# Patient Record
Sex: Male | Born: 1950 | Race: White | Hispanic: No | Marital: Married | Smoking: Never smoker
Health system: Southern US, Community
[De-identification: ages and names within clinical notes are randomized; demographics above are authoritative.]

## PROBLEM LIST (undated history)

## (undated) DIAGNOSIS — E785 Hyperlipidemia, unspecified: Secondary | ICD-10-CM

## (undated) DIAGNOSIS — I10 Essential (primary) hypertension: Secondary | ICD-10-CM

## (undated) DIAGNOSIS — R413 Other amnesia: Secondary | ICD-10-CM

## (undated) DIAGNOSIS — I350 Nonrheumatic aortic (valve) stenosis: Secondary | ICD-10-CM

## (undated) DIAGNOSIS — G4733 Obstructive sleep apnea (adult) (pediatric): Secondary | ICD-10-CM

## (undated) DIAGNOSIS — Z8616 Personal history of COVID-19: Secondary | ICD-10-CM

## (undated) DIAGNOSIS — E119 Type 2 diabetes mellitus without complications: Secondary | ICD-10-CM

## (undated) DIAGNOSIS — I482 Chronic atrial fibrillation, unspecified: Secondary | ICD-10-CM

## (undated) DIAGNOSIS — Z9989 Dependence on other enabling machines and devices: Secondary | ICD-10-CM

## (undated) HISTORY — DX: Nonrheumatic aortic (valve) stenosis: I35.0

## (undated) HISTORY — PX: OTHER SURGICAL HISTORY: SHX169

## (undated) HISTORY — DX: Other amnesia: R41.3

## (undated) HISTORY — DX: Hyperlipidemia, unspecified: E78.5

## (undated) HISTORY — DX: Type 2 diabetes mellitus without complications: E11.9

## (undated) HISTORY — PX: UMBILICAL HERNIA REPAIR: SHX196

## (undated) HISTORY — DX: Personal history of COVID-19: Z86.16

## (undated) HISTORY — DX: Essential (primary) hypertension: I10

## (undated) HISTORY — DX: Dependence on other enabling machines and devices: Z99.89

## (undated) HISTORY — DX: Obstructive sleep apnea (adult) (pediatric): G47.33

## (undated) HISTORY — DX: Chronic atrial fibrillation, unspecified: I48.20

## (undated) HISTORY — PX: CATARACT EXTRACTION: SUR2

---
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
## 1997-12-03 ENCOUNTER — Emergency Department (HOSPITAL_COMMUNITY): Admission: EM | Admit: 1997-12-03 | Discharge: 1997-12-03 | Payer: Self-pay | Admitting: Emergency Medicine

## 2003-11-16 HISTORY — PX: CARDIAC CATHETERIZATION: SHX172

## 2003-11-29 ENCOUNTER — Inpatient Hospital Stay (HOSPITAL_COMMUNITY): Admission: EM | Admit: 2003-11-29 | Discharge: 2003-12-01 | Payer: Self-pay | Admitting: Emergency Medicine

## 2005-07-17 ENCOUNTER — Emergency Department (HOSPITAL_COMMUNITY): Admission: EM | Admit: 2005-07-17 | Discharge: 2005-07-17 | Payer: Self-pay | Admitting: Emergency Medicine

## 2007-10-29 HISTORY — PX: NM MYOCAR PERF WALL MOTION: HXRAD629

## 2007-11-03 ENCOUNTER — Ambulatory Visit (HOSPITAL_COMMUNITY): Admission: RE | Admit: 2007-11-03 | Discharge: 2007-11-03 | Payer: Self-pay | Admitting: Family Medicine

## 2008-04-20 ENCOUNTER — Ambulatory Visit: Payer: Self-pay | Admitting: Internal Medicine

## 2008-04-20 ENCOUNTER — Encounter: Payer: Self-pay | Admitting: Internal Medicine

## 2008-04-20 DIAGNOSIS — G4733 Obstructive sleep apnea (adult) (pediatric): Secondary | ICD-10-CM | POA: Insufficient documentation

## 2008-04-20 DIAGNOSIS — I10 Essential (primary) hypertension: Secondary | ICD-10-CM | POA: Insufficient documentation

## 2008-04-20 DIAGNOSIS — I482 Chronic atrial fibrillation, unspecified: Secondary | ICD-10-CM

## 2008-04-20 HISTORY — DX: Essential (primary) hypertension: I10

## 2008-04-20 HISTORY — DX: Obstructive sleep apnea (adult) (pediatric): G47.33

## 2008-04-20 HISTORY — DX: Chronic atrial fibrillation, unspecified: I48.20

## 2008-04-23 ENCOUNTER — Ambulatory Visit: Payer: Self-pay

## 2008-05-13 ENCOUNTER — Encounter: Payer: Self-pay | Admitting: Internal Medicine

## 2008-05-25 ENCOUNTER — Ambulatory Visit (HOSPITAL_COMMUNITY): Admission: RE | Admit: 2008-05-25 | Discharge: 2008-05-25 | Payer: Self-pay | Admitting: Cardiovascular Disease

## 2008-05-25 HISTORY — PX: CARDIOVERSION: SHX1299

## 2008-06-18 ENCOUNTER — Ambulatory Visit: Payer: Self-pay | Admitting: Internal Medicine

## 2008-06-24 ENCOUNTER — Telehealth: Payer: Self-pay | Admitting: Internal Medicine

## 2008-08-16 ENCOUNTER — Encounter: Payer: Self-pay | Admitting: Internal Medicine

## 2008-11-19 HISTORY — PX: US ECHOCARDIOGRAPHY: HXRAD669

## 2008-12-15 ENCOUNTER — Emergency Department (HOSPITAL_COMMUNITY): Admission: EM | Admit: 2008-12-15 | Discharge: 2008-12-15 | Payer: Self-pay | Admitting: Emergency Medicine

## 2009-01-26 ENCOUNTER — Encounter (INDEPENDENT_AMBULATORY_CARE_PROVIDER_SITE_OTHER): Payer: Self-pay | Admitting: General Surgery

## 2009-01-26 ENCOUNTER — Observation Stay (HOSPITAL_COMMUNITY): Admission: RE | Admit: 2009-01-26 | Discharge: 2009-01-27 | Payer: Self-pay | Admitting: General Surgery

## 2010-04-02 LAB — APTT
aPTT: 25 seconds (ref 24–37)
aPTT: 29 seconds (ref 24–37)

## 2010-04-02 LAB — PROTIME-INR
INR: 1 (ref 0.00–1.49)
INR: 1.25 (ref 0.00–1.49)
Prothrombin Time: 13.1 seconds (ref 11.6–15.2)
Prothrombin Time: 15.6 seconds — ABNORMAL HIGH (ref 11.6–15.2)

## 2010-04-02 LAB — DIFFERENTIAL
Basophils Absolute: 0.1 10*3/uL (ref 0.0–0.1)
Basophils Relative: 1 % (ref 0–1)
Eosinophils Absolute: 0.1 10*3/uL (ref 0.0–0.7)
Eosinophils Relative: 1 % (ref 0–5)
Lymphocytes Relative: 21 % (ref 12–46)
Lymphs Abs: 1.4 10*3/uL (ref 0.7–4.0)
Monocytes Absolute: 0.7 10*3/uL (ref 0.1–1.0)
Monocytes Relative: 10 % (ref 3–12)
Neutro Abs: 4.6 10*3/uL (ref 1.7–7.7)
Neutrophils Relative %: 67 % (ref 43–77)

## 2010-04-02 LAB — CBC
HCT: 46.4 % (ref 39.0–52.0)
Hemoglobin: 15.4 g/dL (ref 13.0–17.0)
MCHC: 33.1 g/dL (ref 30.0–36.0)
MCV: 87.6 fL (ref 78.0–100.0)
Platelets: 238 10*3/uL (ref 150–400)
RBC: 5.3 MIL/uL (ref 4.22–5.81)
RDW: 13.1 % (ref 11.5–15.5)
WBC: 6.8 10*3/uL (ref 4.0–10.5)

## 2010-04-02 LAB — BASIC METABOLIC PANEL
BUN: 14 mg/dL (ref 6–23)
CO2: 28 mEq/L (ref 19–32)
Calcium: 9.5 mg/dL (ref 8.4–10.5)
Chloride: 104 mEq/L (ref 96–112)
Creatinine, Ser: 0.88 mg/dL (ref 0.4–1.5)
GFR calc Af Amer: >60 mL/min (ref >60)
GFR calc non Af Amer: >60 mL/min (ref >60)
Glucose, Bld: 135 mg/dL — ABNORMAL HIGH (ref 70–99)
Potassium: 3.7 mEq/L (ref 3.5–5.1)
Sodium: 139 mEq/L (ref 135–145)

## 2010-04-02 LAB — GLUCOSE, CAPILLARY
Glucose-Capillary: 138 mg/dL — ABNORMAL HIGH (ref 70–99)
Glucose-Capillary: 143 mg/dL — ABNORMAL HIGH (ref 70–99)
Glucose-Capillary: 165 mg/dL — ABNORMAL HIGH (ref 70–99)
Glucose-Capillary: 170 mg/dL — ABNORMAL HIGH (ref 70–99)
Glucose-Capillary: 184 mg/dL — ABNORMAL HIGH (ref 70–99)

## 2010-04-25 LAB — PROTIME-INR
INR: 2.9 — ABNORMAL HIGH (ref 0.00–1.49)
Prothrombin Time: 32.5 seconds — ABNORMAL HIGH (ref 11.6–15.2)

## 2010-04-25 LAB — GLUCOSE, CAPILLARY: Glucose-Capillary: 92 mg/dL (ref 70–99)

## 2010-05-30 NOTE — Op Note (Signed)
NAMEBIRNEY, BELSHE NO.:  [id number]   MEDICAL RECORD NO.:  [id number]          PATIENT TYPE:  OIB   LOCATION:  2899                         FACILITY:  [facility]   PHYSICIAN:  Nanetta Batty, M.D.   DATE OF BIRTH:  05-30-1950   DATE OF PROCEDURE:  DATE OF DISCHARGE:  05/25/2008                               OPERATIVE REPORT   Mr. Nemes is a 60 year old mildly overweight Caucasian male with  history of paroxysmal AFib since 2005.  His last recorded sinus rhythm  was in October 2009.  He has normal coronary arteries by cath, November 30, 2003, and normal LV function.  He has been on Coumadin  anticoagulation.  His other problems include hypertension,  hypothyroidism, non-insulin-requiring diabetes, and hyperlipidemia.  He  does have sleep apnea, currently on CPAP.  Myoview performed in October  2009 was nonischemic.  We have been titrating his negative chronotropes  and he has seen Dr. Hillis Range for electrophysiologic evaluation and  put on MULTAQ.  He presents now for attempt at elective outpatient DC  cardioversion.   The patient was brought to the second floor of [facility] outpatient  area in the postabsorptive state.  His labs were normal.  His INR was  therapeutic.  AP pads were placed.  Dr. Randa Evens, the anesthesiologist,  administered 100 mg of IV propofol and 40 mg of lidocaine.  He had 1  shock and 120 joules biphasic restoring him to sinus rhythm.  He  tolerated the procedure well.  He will be discharged home in 2 hours,  and a 1 week office visit has been made.       Nanetta Batty, M.D.  Electronically Signed     JB/MEDQ  D:  05/25/2008  T:  05/26/2008  Job:  [id number]   cc:   [id number] [facility].  Hillis Range, MD  Leane Call, MD

## 2010-06-02 NOTE — Cardiovascular Report (Signed)
NAMEZEKIAH, CARUTH                 ACCOUNT NO.:  [id number]   MEDICAL RECORD NO.:  [id number]          PATIENT TYPE:  INP   LOCATION:  6531                         FACILITY:  [facility]   PHYSICIAN:  Thereasa Solo. Little, M.D. DATE OF BIRTH:  1950-05-20   DATE OF PROCEDURE:  11/30/2003  DATE OF DISCHARGE:                              CARDIAC CATHETERIZATION   INDICATIONS FOR PROCEDURE:  This 60 year old male presented with chest  discomfort and rapid atrial fibrillation.  His cardiac enzymes are  unremarkable.  His atrial fibrillation converted with medical therapy.  He  is brought to the catheterization lab for evaluation of his coronary  anatomy.   After obtaining informed consent, the patient was prepped and draped in the  usual sterile fashion, exposing the right groin.  Applying local anesthetic  with 1% Xylocaine, the Seldinger technique was employed and a 5-French  introducer sheath was placed into the right femoral artery.  Left and right  coronary arteriography, ventriculography and a distal aortogram was  performed.   COMPLICATIONS:  None.   EQUIPMENT:  Judkins 5-French configuration catheters.   TOTAL CONTRAST:  90 cc.   RESULTS:   HEMODYNAMIC MONITORING:  1.  Central aortic pressure:  114/70.  2.  Left ventricular pressure:  114/9 with no aortic valve gradient noted at      the time of pullback.   VENTRICULOGRAPHY:  Done in the RAO projection, revealed normal left  ventricular systolic  function. Ejection fraction was greater than 60%.  No  mitral regurgitation.  Left ventricular end diastolic pressure was 17.   DISTAL AORTOGRAM:  There was no evidence of renal artery stenosis, abdominal  aortic aneurysm  or iliac disease.   CORONARY ARTERIOGRAPHY:  1.  LEFT MAIN:  Normal.  2.  LEFT ANTERIOR DESCENDING ARTERY:  This extended down towards the apex of      the heart. There was a moderate sized first diagonal that was free of      disease.  The LAD itself had only  minimal irregularities.  3.  CIRCUMFLEX CORONARY ARTERY:  A very large vessel, greater than 4 mm in      diameter.  It gave rise to a medium sized first OM and a very large      second OM -- all of which were free of significant disease.  4.  RIGHT CORONARY ARTERY:  Greater than a 4 mm vessel, with less than 20%      narrowing in the proximal segment.  The PDA and posterolateral vessels      were free of disease.   CONCLUSION:  1.  Normal left ventricular systolic  function.  2.  No renal artery stenosis.  3.  No coronary artery disease, except for minimal irregularities.       ABL/MEDQ  D:  11/30/2003  T:  11/30/2003  Job:  [id number]   cc:   Janeece Riggers. Severiano Gilbert, M.D.  Fax: [phone]   Leane Call, M.D.  [place], [place]

## 2010-06-02 NOTE — Discharge Summary (Signed)
William Preston, William Preston                 ACCOUNT NO.:  [id number]   MEDICAL RECORD NO.:  [id number]          PATIENT TYPE:  INP   LOCATION:  6531                         FACILITY:  [facility]   PHYSICIAN:  Thereasa Solo. Little, M.D. DATE OF BIRTH:  January 07, 1951   DATE OF ADMISSION:  11/29/2003  DATE OF DISCHARGE:  12/01/2003                                 DISCHARGE SUMMARY   ADMISSION DIAGNOSES:  1.  New onset of atrial fibrillation with one prior episode reported.      History of negative stress test.  2.  Hypertension x10 years.  3.  Adult-onset diabetes mellitus, non-insulin-dependent, 2-3 years with      questionable control.  4.  Hyperlipidemia.  5.  Probable diabetic neuropathy.  6.  Obesity with body mass index of 35.9.   DISCHARGE DIAGNOSES:  1.  New onset of atrial fibrillation with one prior episode reported.      History of negative stress test.  2.  Hypertension x10 years.  3.  Adult-onset diabetes mellitus, non-insulin-dependent, 2-3 years with      questionable control.  4.  Hyperlipidemia.  5.  Probable diabetic neuropathy.  6.  Obesity with body mass index of 35.9.   PROCEDURE:  Cardiac catheterization on December 01, 2003.   BRIEF HISTORY:  The patient is a 60 year old white male who lives in  [place], [place], and commutes to [place].  He went to work today  feeling bad.  He had a headache and was tired easily when climbing stairs.  He became diaphoretic.  He decreased his activity.  He finally went to see  the nurse at work around noon today.  His blood pressure was in the 90s, and  his heart rate was between 150-160.  EMTs were called.  An EKG showed atrial  fibrillation with a rate of 170 on their arrival.  He was treated with IV  Cardizem and converted en route to the ER.  Patient has a history of skips  he can feel but could not tell when his heart rate was up or when his heart  rate came back down.  He denied any chest pain.  He did not have any  perceived  shortness of breath with this but in retrospect says he was  probably short of breath, which was the reason his activity was so much more  difficult.  He had no nausea or vomiting.  Some mild diaphoresis.  He was  seen in the ER, and his EKG showed a sinus rhythm with Q waves in the septal  leads but probably positional.  T was flipped in aVL.  Point-of-care markers  were negative.  He was seen in the ER by Dr. Severiano Gilbert and admitted for  observation, rule out MI, and probable cardiac catheterization the following  day.   PAST MEDICAL HISTORY:  1.  Hypertension x10 years.  2.  AODM 2-3 years, he is not sure.  Some questionable diabetic neuropathy.  3.  Hyperlipidemia.  4.  Obesity with a BMI of 35.9.  5.  Status post splenectomy.  6.  History of  probable AF last year, single episode.  He underwent a stress      test at that time.  He has not been back since.  He has no prior 2-D      echo or cath and no documentation of the event that he had last year.   ALLERGIES:  None.   ETOH:  None.   CIGARETTES:  Never smoked.   DRUGS:  None.   REVIEW OF SYSTEMS:  He has had a headache all day.  PULMONARY:  No PND, no  orthopnea, no cough.  GI:  Negative.  GU:  Negative.  LOWER EXTREMITIES:  He  has some occasional numbness and swelling.   FAMILY HISTORY:  Both parents are deceased with coronary artery disease.   SOCIAL HISTORY:  He is married x35 years with three kids.  Works in  Restaurant manager, fast food.   HOME MEDICATIONS:  1.  Glucovance 25/500 daily.  2.  Pravachol 40 mg daily.  3.  Avapro, dose uncertain, with 150 daily.  4.  Hydrochlorothiazide 25 mg daily.  5.  Aspirin 81 mg daily.   PHYSICAL EXAMINATION:  VITAL SIGNS:  His heart rate was down to 89.  Blood  pressure was 140/73 in the ER.  Respiratory rate was 20.  Sats were greater  than 90%.  GENERAL:  Patient is an obese white male, well-developed and well-nourished,  in no acute distress.  HEENT:  Head is normocephalic.   Eyes:  PERRLA.  EOMs are intact.  Fundi not  visualized.  Ears, nose, mouth, and throat grossly within normal limits.  NECK:  No bruits.  No JVD.  No thyromegaly.  LUNGS:  Clear to auscultation and percussion.  CARDIAC:  No murmurs or rubs.  GI:  Soft.  Positive bowel sounds.  No bruits.  GU/RECTAL:  Not indicated.  EXTREMITIES:  Posterior DP and PT.  No edema.  NEUROLOGIC:  He has tingling in both feet, otherwise negative.   HOSPITAL COURSE:  He was seen and admitted to rule out MI.  His CKs and  troponins remained negative.  His hemoglobin A1c was 7.8.  TSH was 3.255.  Electrolytes were normal.  BUN was 20.  Creatinine was 0.9.  Glucose was  149.  Magnesium was 2.1.  He remained pain free.  He had good distal pulses.  He was seen the following a.m. and scheduled for a cardiac catheterization  by Dr. Clarene Duke on November 21, 2003.  This showed a normal left main.  The  circumflex was a large vessel, normal size.  The LAD went to the apex and  had a moderate-sized first diagonal.  All were normal.  The RCA was a 4 mm  vessel, less than 20% proximal RCA stenosis.  PDA and PL were normal.  The  LV showed normal systolic function with an EF of greater than 60%.  There  was no MR.   Patient was monitored overnight.  He remained in sinus rhythm.  Post cath,  his BUN is 14, creatinine 0.9.  Electrolytes were normal.  Glucose was down  to 126.  Hemoglobin is 15, hematocrit is 45, white count is 7.8, platelets  are 309,000.  He was seen the following a.m. by Dr. Clarene Duke.  He remained in  sinus rhythm, and it was Dr. Fredirick Maudlin opinion that the patient could be  discharged home on aspirin only.  This was discussed with the patient and  his wife in detail.  If he has one more episode of  atrial fibrillation, he  will need to go on Coumadin.   DISCHARGE MEDICATIONS:  1.  Toprol-XL 50 mg daily.  2.  Avapro 75 mg daily.  3.  Pravachol 80 mg daily. 4.  Glucovance 5/500, which he is to start on December 02, 2003.  5.  Aspirin 325 mg daily.  6.  Hydrochlorothiazide 1 mg daily.   He was scheduled to follow up with Dr. Severiano Gilbert, the physician who saw him in  the ER.  He is also to contact Dr. Clerance Lav, his primary care  physician in [place], [place], for followup.   CONDITION ON DISCHARGE:  Improved.       WDJ/MEDQ  D:  01/11/2004  T:  01/11/2004  Job:  [id number]   cc:   Janeece Riggers. Severiano Gilbert, M.D.  Fax: [phone]   Dr. Clerance Lav  [place], [place]

## 2010-06-07 IMAGING — CR DG CHEST 2V
2 series · 2 of 2 positions shown · non-contrast
Comparison: 11/29/2003.

CLINICAL DATA: Chest pain

CHEST - 2 VIEW

[w chest pa]
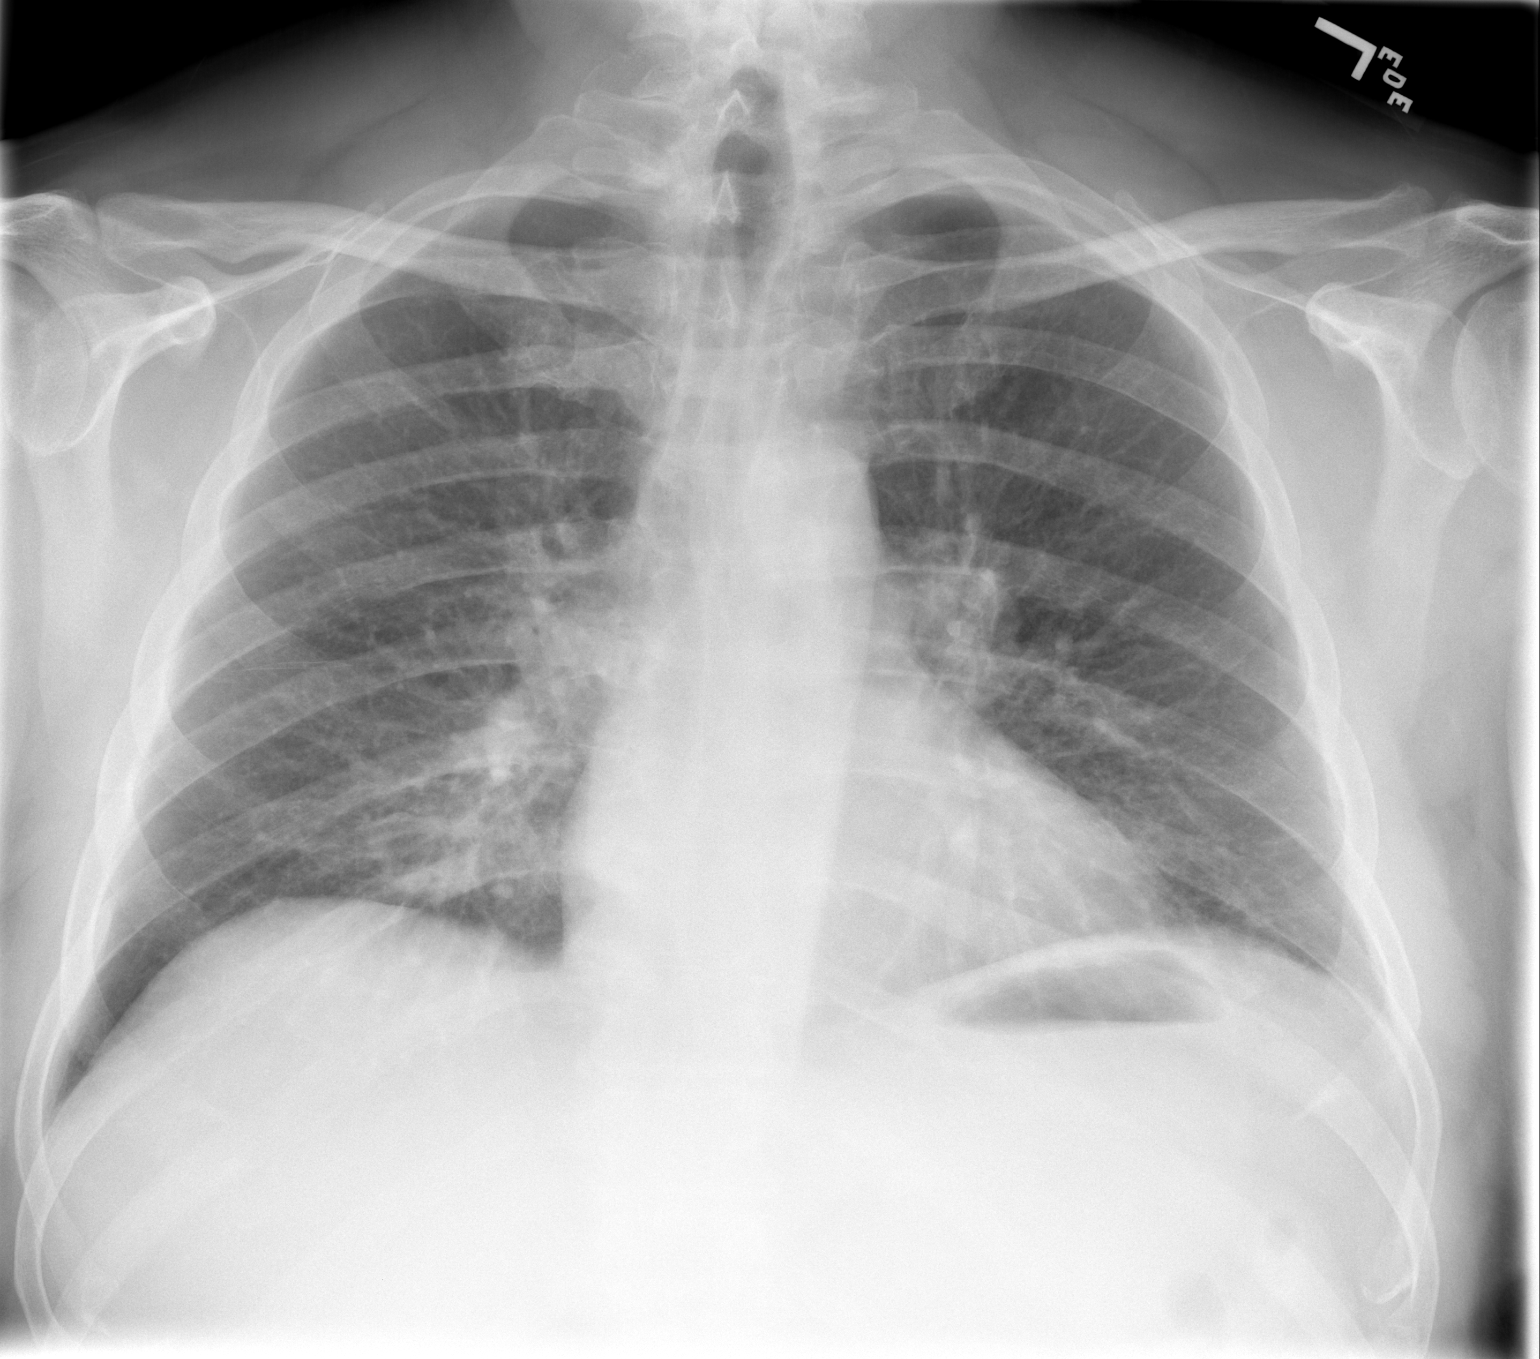

[w chest lat]
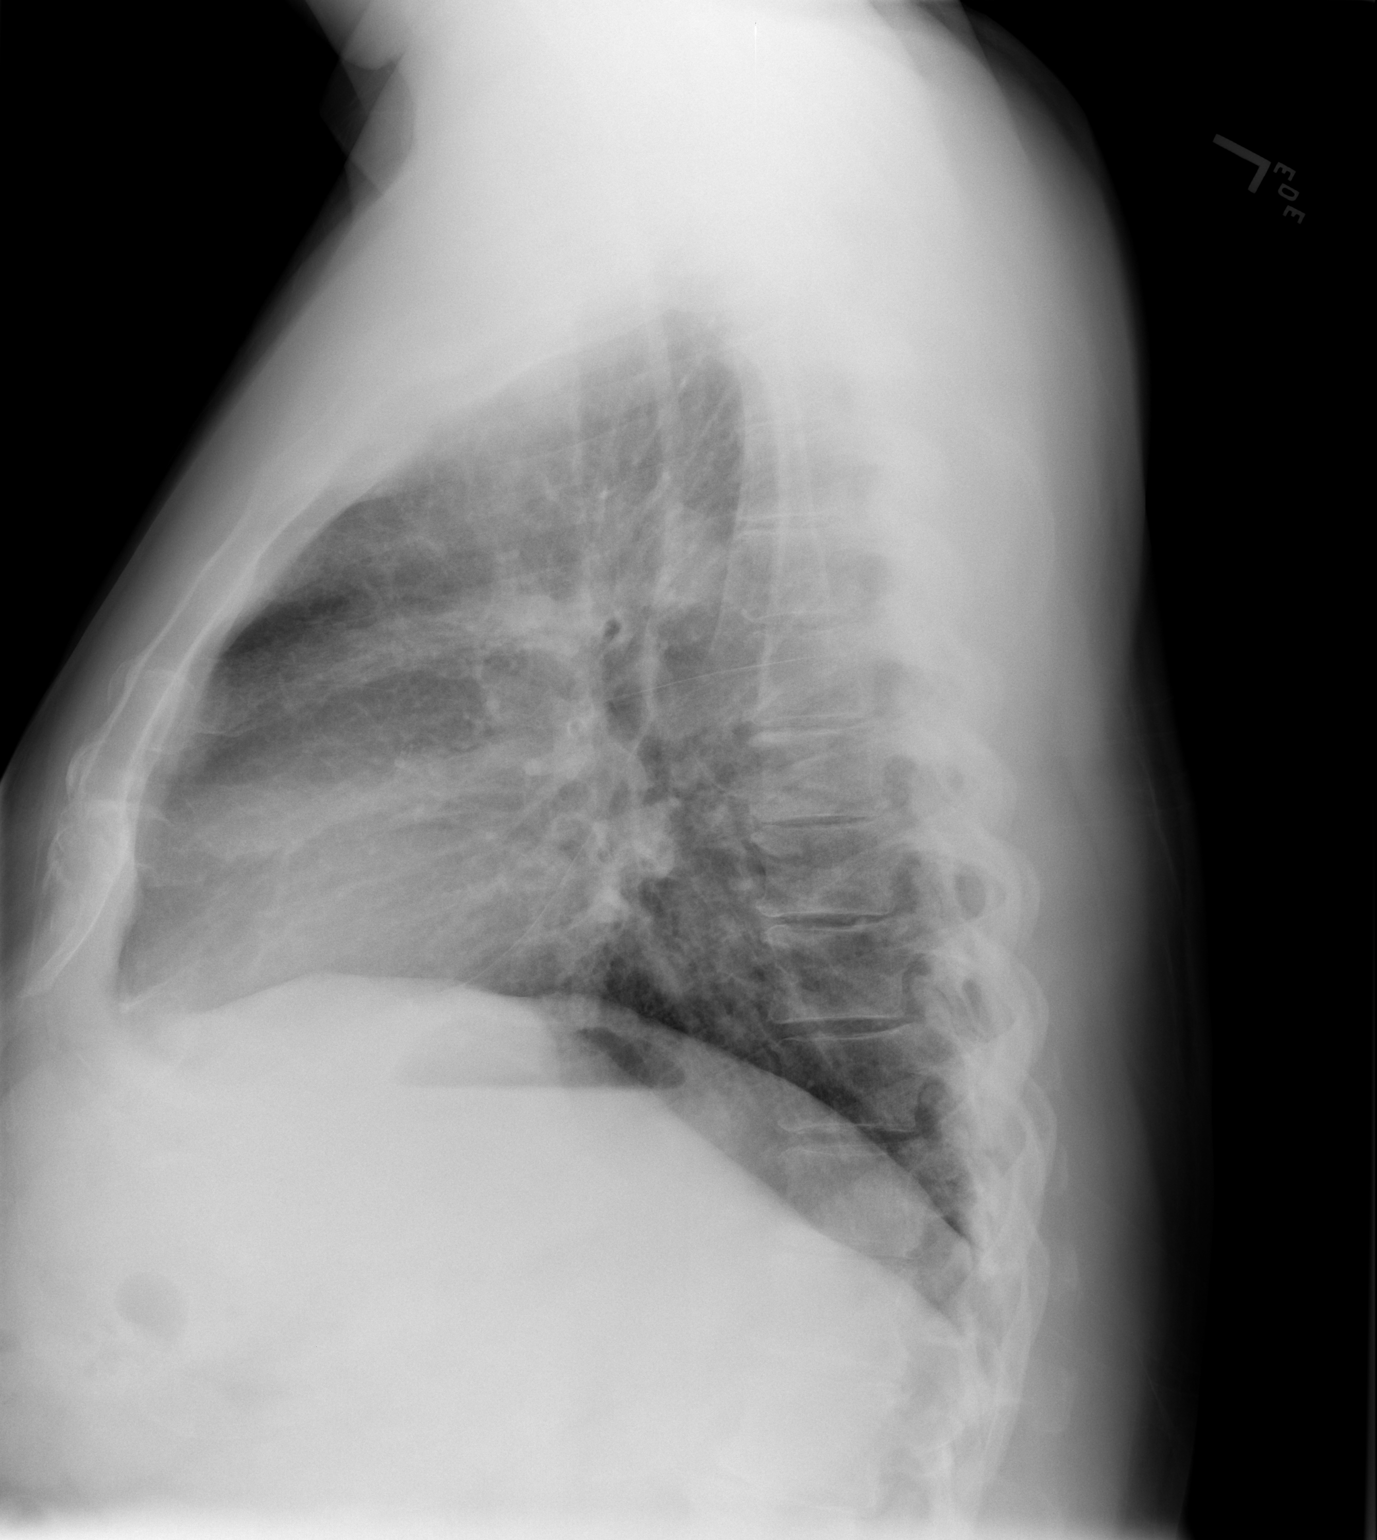

[2 of 2 positions shown; findings below may reference images not displayed]

FINDINGS: Diffuse peribronchial thickening.  Confluent opacities in
the right lower lung likely represent an infectious infiltrate.
The exam is negative for edema.  The heart is normal.  There is no
appreciable adenopathy.
IMPRESSION: 1.  Bronchitis with associated right lower lobe pneumonia.

## 2010-12-28 IMAGING — CR DG CHEST 2V
2 series · 2 of 2 positions shown · non-contrast
Comparison: 11/03/2007

CLINICAL DATA: Pre cardioversion

CHEST - 2 VIEW

[view not recorded (1 of 2)]
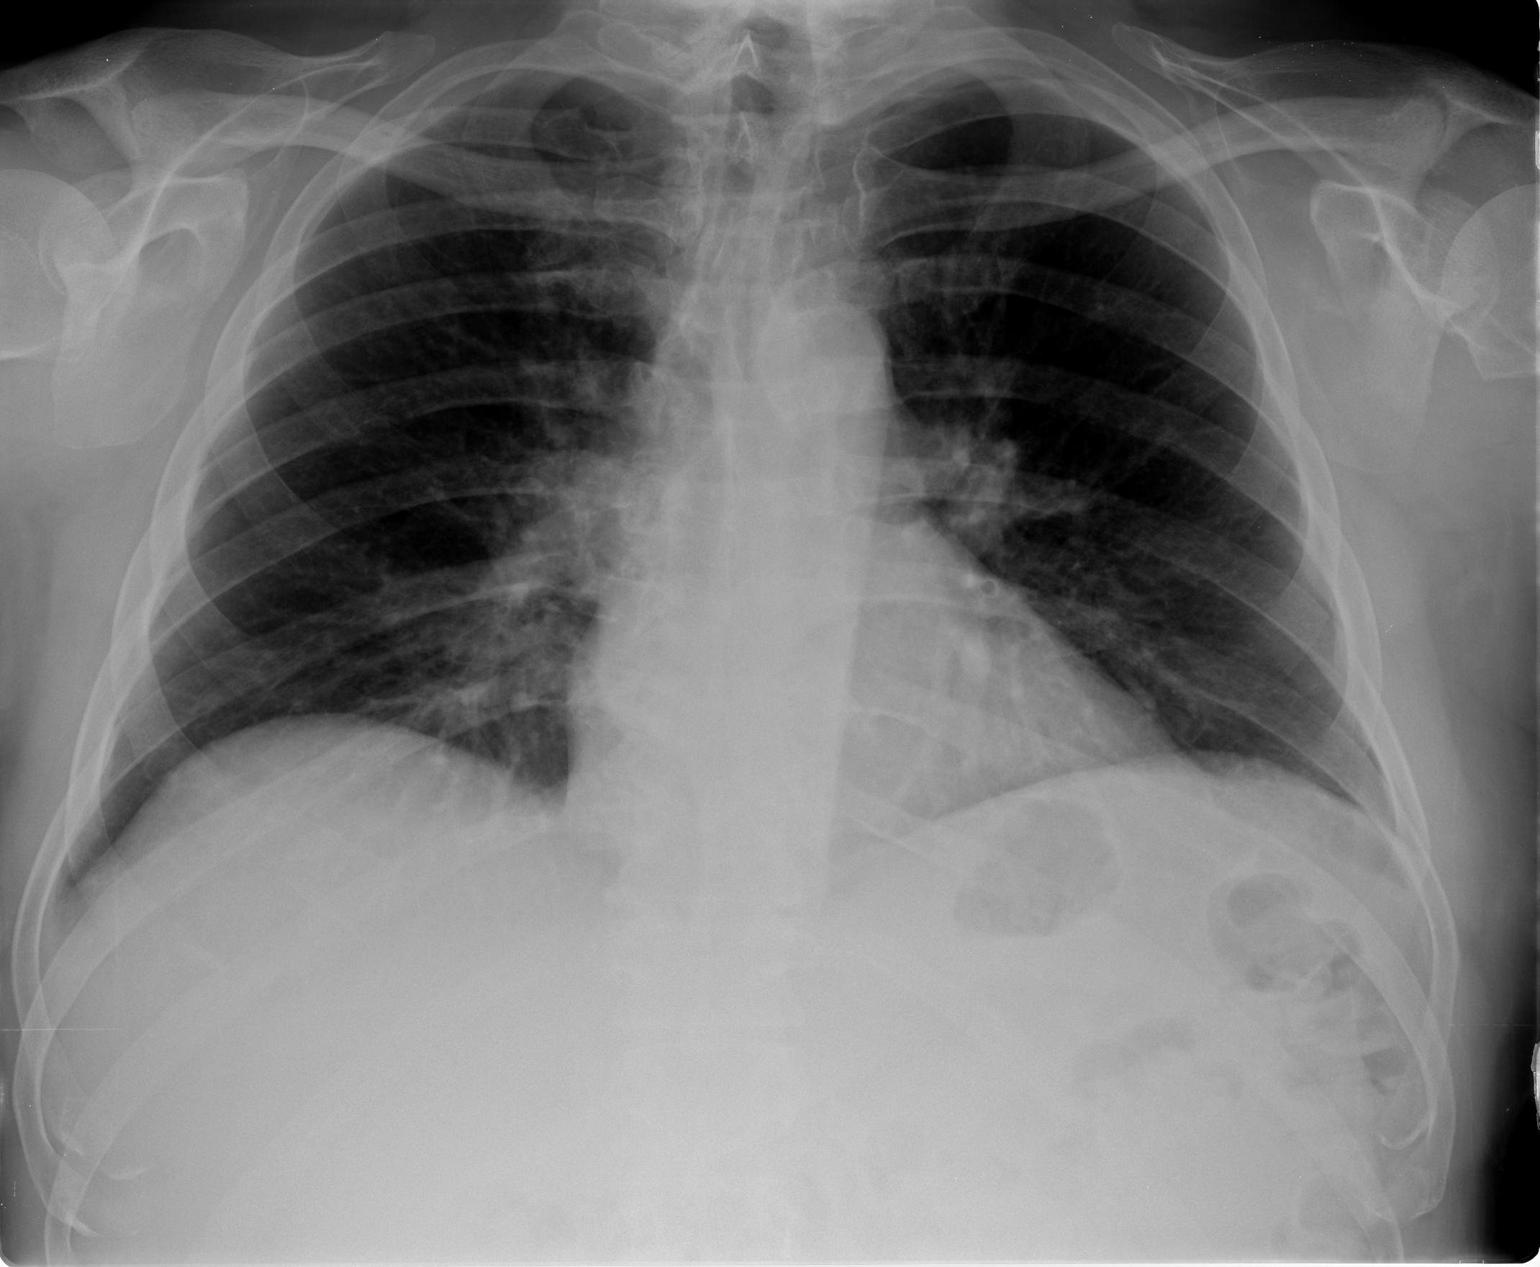

[view not recorded (2 of 2)]
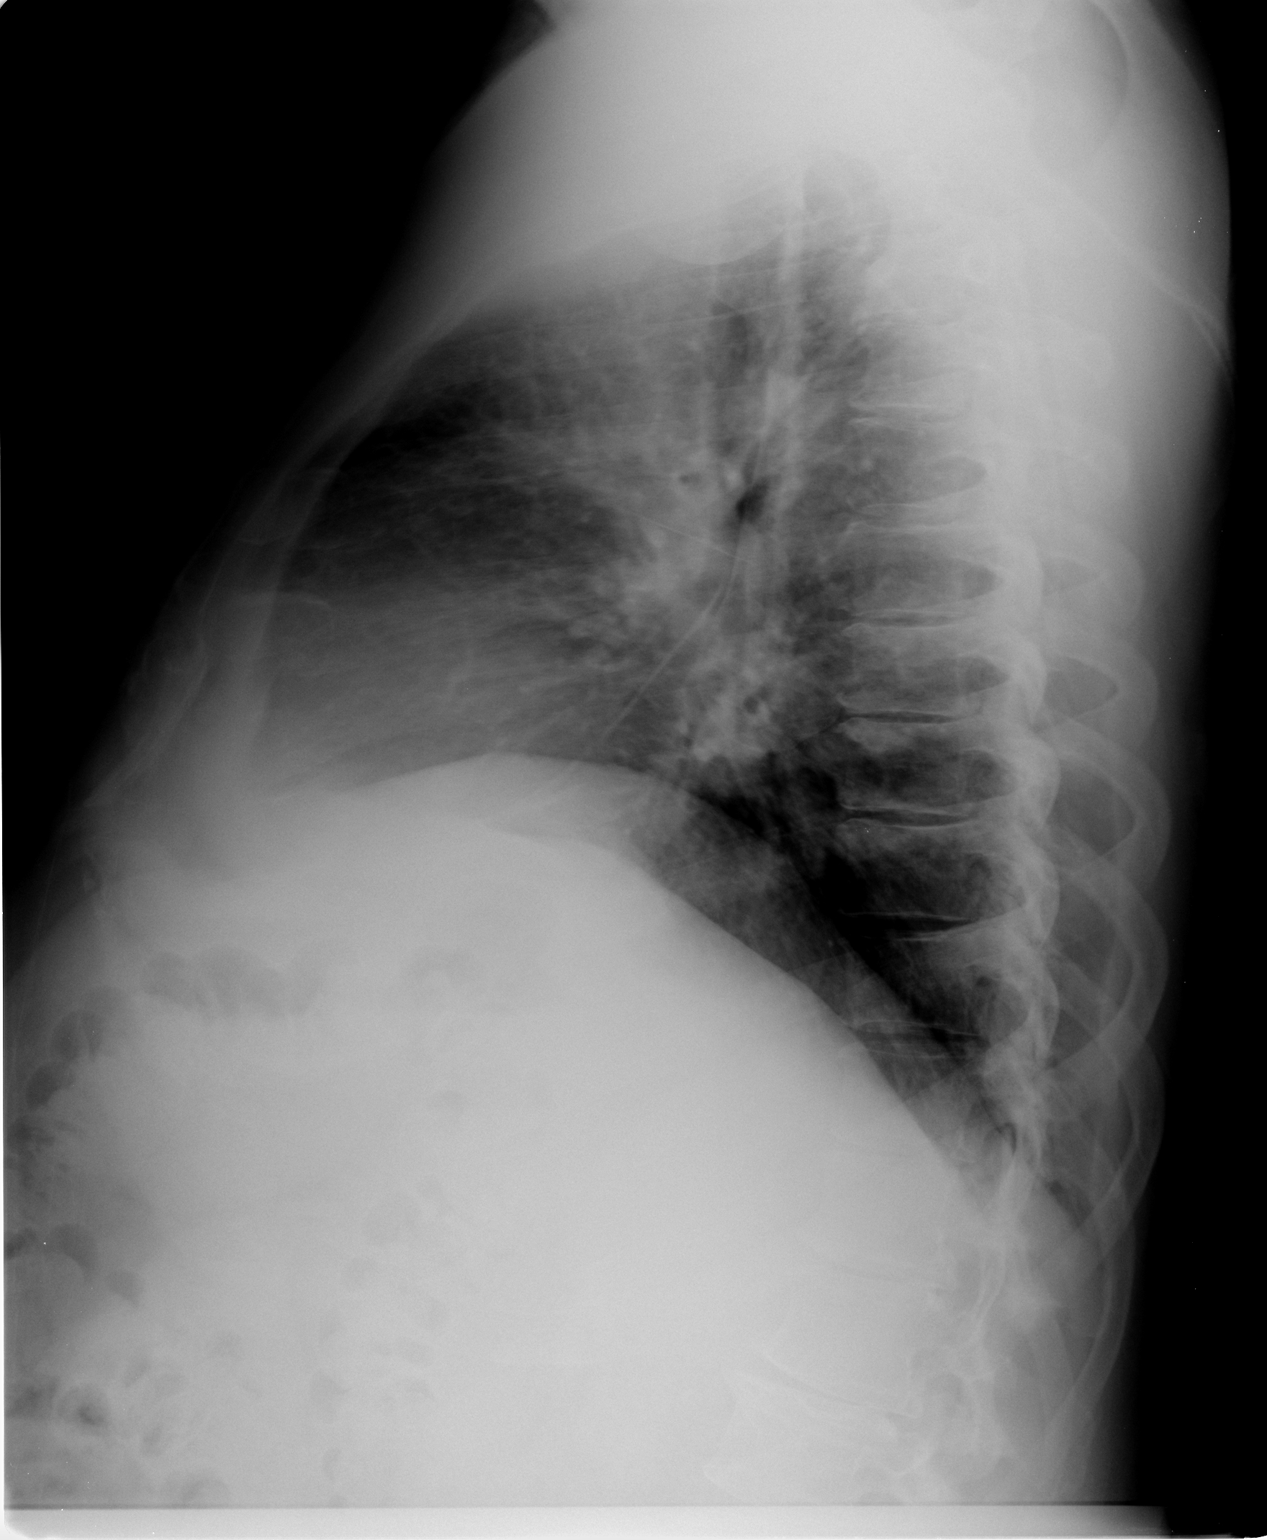

[2 of 2 positions shown; findings below may reference images not displayed]

FINDINGS: The heart size is normal.

There is no pleural effusion or pulmonary edema

The lung volumes are low.  No airspace densities.
IMPRESSION: 1.  No acute findings.

## 2012-04-01 ENCOUNTER — Ambulatory Visit: Payer: Self-pay | Admitting: Cardiovascular Disease

## 2012-04-01 DIAGNOSIS — I4891 Unspecified atrial fibrillation: Secondary | ICD-10-CM

## 2012-04-01 DIAGNOSIS — Z7901 Long term (current) use of anticoagulants: Secondary | ICD-10-CM

## 2012-04-01 HISTORY — DX: Long term (current) use of anticoagulants: Z79.01

## 2012-06-20 ENCOUNTER — Ambulatory Visit (INDEPENDENT_AMBULATORY_CARE_PROVIDER_SITE_OTHER): Payer: BC Managed Care – PPO | Admitting: Pharmacist Clinician (PhC)/ Clinical Pharmacy Specialist

## 2012-06-20 VITALS — BP 110/66 | HR 68

## 2012-06-20 DIAGNOSIS — I4891 Unspecified atrial fibrillation: Secondary | ICD-10-CM

## 2012-06-20 DIAGNOSIS — Z7901 Long term (current) use of anticoagulants: Secondary | ICD-10-CM

## 2012-06-20 LAB — POCT INR: INR: 2

## 2012-07-30 ENCOUNTER — Ambulatory Visit (INDEPENDENT_AMBULATORY_CARE_PROVIDER_SITE_OTHER): Payer: BC Managed Care – PPO | Admitting: Pharmacist Clinician (PhC)/ Clinical Pharmacy Specialist

## 2012-07-30 VITALS — BP 88/60 | HR 80

## 2012-07-30 DIAGNOSIS — Z7901 Long term (current) use of anticoagulants: Secondary | ICD-10-CM

## 2012-07-30 DIAGNOSIS — I4891 Unspecified atrial fibrillation: Secondary | ICD-10-CM

## 2012-07-30 LAB — POCT INR: INR: 1.9

## 2012-09-25 ENCOUNTER — Ambulatory Visit (INDEPENDENT_AMBULATORY_CARE_PROVIDER_SITE_OTHER): Payer: BC Managed Care – PPO | Admitting: Pharmacist Clinician (PhC)/ Clinical Pharmacy Specialist

## 2012-09-25 VITALS — BP 98/70 | HR 80

## 2012-09-25 DIAGNOSIS — I4891 Unspecified atrial fibrillation: Secondary | ICD-10-CM

## 2012-09-25 DIAGNOSIS — Z7901 Long term (current) use of anticoagulants: Secondary | ICD-10-CM

## 2012-09-25 LAB — POCT INR: INR: 2.3

## 2012-11-06 ENCOUNTER — Ambulatory Visit: Payer: BC Managed Care – PPO | Admitting: Cardiology

## 2012-11-06 ENCOUNTER — Encounter: Payer: Self-pay | Admitting: *Deleted

## 2012-11-06 ENCOUNTER — Ambulatory Visit (INDEPENDENT_AMBULATORY_CARE_PROVIDER_SITE_OTHER): Payer: BC Managed Care – PPO | Admitting: Cardiology

## 2012-11-06 ENCOUNTER — Encounter: Payer: Self-pay | Admitting: Cardiology

## 2012-11-06 ENCOUNTER — Ambulatory Visit (INDEPENDENT_AMBULATORY_CARE_PROVIDER_SITE_OTHER): Payer: BC Managed Care – PPO | Admitting: Pharmacist Clinician (PhC)/ Clinical Pharmacy Specialist

## 2012-11-06 VITALS — BP 120/80 | HR 96 | Ht 69.0 in | Wt 221.0 lb

## 2012-11-06 DIAGNOSIS — I4891 Unspecified atrial fibrillation: Secondary | ICD-10-CM

## 2012-11-06 DIAGNOSIS — R011 Cardiac murmur, unspecified: Secondary | ICD-10-CM | POA: Insufficient documentation

## 2012-11-06 DIAGNOSIS — IMO0001 Reserved for inherently not codable concepts without codable children: Secondary | ICD-10-CM | POA: Insufficient documentation

## 2012-11-06 DIAGNOSIS — E119 Type 2 diabetes mellitus without complications: Secondary | ICD-10-CM | POA: Insufficient documentation

## 2012-11-06 DIAGNOSIS — I10 Essential (primary) hypertension: Secondary | ICD-10-CM

## 2012-11-06 DIAGNOSIS — Z7901 Long term (current) use of anticoagulants: Secondary | ICD-10-CM

## 2012-11-06 DIAGNOSIS — I482 Chronic atrial fibrillation, unspecified: Secondary | ICD-10-CM

## 2012-11-06 DIAGNOSIS — E785 Hyperlipidemia, unspecified: Secondary | ICD-10-CM

## 2012-11-06 DIAGNOSIS — G4733 Obstructive sleep apnea (adult) (pediatric): Secondary | ICD-10-CM

## 2012-11-06 DIAGNOSIS — Z0389 Encounter for observation for other suspected diseases and conditions ruled out: Secondary | ICD-10-CM

## 2012-11-06 HISTORY — DX: Cardiac murmur, unspecified: R01.1

## 2012-11-06 HISTORY — DX: Type 2 diabetes mellitus without complications: E11.9

## 2012-11-06 HISTORY — DX: Hyperlipidemia, unspecified: E78.5

## 2012-11-06 NOTE — Assessment & Plan Note (Signed)
He says he is having some problems with his C-pap

## 2012-11-06 NOTE — Assessment & Plan Note (Signed)
He has chronic AF and tolerates this well

## 2012-11-06 NOTE — Assessment & Plan Note (Signed)
Followed at our Coumadin clinic

## 2012-11-06 NOTE — Assessment & Plan Note (Signed)
Recent Hgb A1c 7.1

## 2012-11-06 NOTE — Patient Instructions (Signed)
Contact your doctor in [place] about a sleep study there. We will be glad to see you here if there is any problem. You can take Lasix 20 mg as needed for edema. Your physician has requested that you have an echocardiogram. Echocardiography is a painless test that uses sound waves to create images of your heart. It provides your doctor with information about the size and shape of your heart and how well your heart's chambers and valves are working. This procedure takes approximately one hour. There are no restrictions for this procedure. Your physician recommends that you schedule a follow-up appointment in: 6  months with Dr Allyson Sabal

## 2012-11-06 NOTE — Progress Notes (Signed)
11/06/2012 William Preston   May 06, 1950  [id number]  Primary Physicia William Robes, MD Primary Cardiologist: Dr William Preston  HPI:  Pleasant 62 y/o who is from [place]. 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 He has chronic permanent atrial fibrillation. He has had prior normal coronaries in May 2010. 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 He wanted a referral for a sleep study in [place] and I suggested he contact his primary MD about that. He know if there is any problem getting it there we can see him here. He also asked about taking his Lasix prn. He says he has no edema and his B/P is controlled. He has cut the dose back on his own to 20 mg. He has no problems with chest pain or SOB.    Current Outpatient Prescriptions  Medication Sig Dispense Refill  . digoxin (LANOXIN) 0.25 MG tablet Take 0.125 mg by mouth daily.      Marland Kitchen diltiazem (CARDIZEM CD) 240 MG 24 hr capsule Take 240 mg by mouth daily.      Marland Kitchen doxazosin (CARDURA) 2 MG tablet Take 2 mg by mouth at bedtime.      Marland Kitchen glyBURIDE-metformin (GLUCOVANCE) 2.5-500 MG per tablet Take 1 tablet by mouth 2 (two) times daily with a meal.      . losartan (COZAAR) 50 MG tablet Take 50 mg by mouth daily.      . metoprolol tartrate (LOPRESSOR) 25 MG tablet Take 12.5 mg by mouth 2 (two) times daily.      . pravastatin (PRAVACHOL) 80 MG tablet Take 80 mg by mouth daily.      Marland Kitchen warfarin (COUMADIN) 5 MG tablet Take 5 mg by mouth daily. Per INR      . furosemide (LASIX) 40 MG tablet Take 0.5 tablets (20 mg total) by mouth daily as needed for edema.  30 tablet    . ONE TOUCH ULTRA TEST test strip        No current facility-administered medications for this visit.    No Known Allergies  History   Social History  . Marital Status: Married    Spouse Name: N/A    Number of Children: N/A  . Years of Education: N/A    Occupational History  . Not on file.   Social History Main Topics  . Smoking status: Never Smoker   . Smokeless tobacco: Not on file  . Alcohol Use: No  . Drug Use: No  . Sexual Activity: Not on file   Other Topics Concern  . Not on file   Social History Narrative  . No narrative on file     Review of Systems: General: negative for chills, fever, night sweats or weight changes.  Cardiovascular: negative for chest pain, dyspnea on exertion, edema, orthopnea, palpitations, paroxysmal nocturnal dyspnea or shortness of breath Dermatological: negative for rash Respiratory: negative for cough or wheezing Urologic: negative for hematuria Abdominal: negative for nausea, vomiting, diarrhea, bright red blood per rectum, melena, or hematemesis Neurologic: negative for visual changes, syncope, or dizziness Lost 20 lbs with diet over the last 6-8 months All other systems reviewed and are otherwise negative except as noted above.    Blood pressure 120/80, pulse 96, height 5\' 9"  (1.753 m), weight 221 lb (100.245 kg).  General appearance: alert, cooperative and no distress Neck: transmitted murmur to Lt carotid Lungs:  clear to auscultation bilaterally Heart: irregularly irregular rhythm and 2/6 AS murmur Extremities: no edema  EKG AF with CVR  ASSESSMENT AND PLAN:   Chronic atrial fibrillation He has chronic AF and tolerates this well  Long term (current) use of anticoagulants Followed at our Coumadin clinic  [facility]- on C-pap He says he is having some problems with his C-pap  HYPERTENSION, BENIGN Controlled  Diabetes mellitus Recent Hgb A1c 7.1  Normal coronary arteries- May 2010 .  Murmur, cardiac He sounds like he has mild aortic stenosis on exam, (aortic sclerosis on echo 2010)   PLAN  He will contact his primary MD about his C-pap. I told him he could take his Lasix prn. I did order an echo for AS  Murmur, (if no AS would get carotid dopplers for Lt carotid  transmitted murmur vs bruit).  He will see Dr William Preston in 6 months. Recent labs from his primary looked good.   [facility] 11/06/2012 4:31 PM

## 2012-11-06 NOTE — Assessment & Plan Note (Signed)
Controlled.  

## 2012-11-06 NOTE — Assessment & Plan Note (Signed)
He sounds like he has mild aortic stenosis on exam, (aortic sclerosis on echo 2010)

## 2012-11-07 ENCOUNTER — Encounter: Payer: Self-pay | Admitting: Cardiology

## 2012-11-10 ENCOUNTER — Telehealth (HOSPITAL_COMMUNITY): Payer: Self-pay | Admitting: *Deleted

## 2012-11-20 ENCOUNTER — Ambulatory Visit (HOSPITAL_COMMUNITY)
Admission: RE | Admit: 2012-11-20 | Discharge: 2012-11-20 | Disposition: A | Discharge status: Home / Self Care | Payer: BC Managed Care – PPO | Source: Ambulatory Visit | Attending: Cardiovascular Disease | Admitting: Cardiovascular Disease

## 2012-11-20 ENCOUNTER — Telehealth: Payer: Self-pay | Admitting: *Deleted

## 2012-11-20 DIAGNOSIS — R011 Cardiac murmur, unspecified: Secondary | ICD-10-CM | POA: Insufficient documentation

## 2012-11-20 NOTE — Telephone Encounter (Signed)
^^  Walk-In Messag received 8:26am^^  "Medications have not arrived from [facility].  They have not called to confirm order."  Returned call.  Left message to call back before 4pm.

## 2012-11-20 NOTE — Progress Notes (Signed)
2D Echo Performed 01/31/2012    Jillana Selph, RCS  

## 2012-11-25 ENCOUNTER — Other Ambulatory Visit: Payer: Self-pay | Admitting: Pharmacist Clinician (PhC)/ Clinical Pharmacy Specialist

## 2012-11-25 MED ORDER — WARFARIN SODIUM 5 MG PO TABS: ORAL_TABLET | ORAL | Status: DC

## 2012-12-01 ENCOUNTER — Encounter: Payer: Self-pay | Admitting: *Deleted

## 2012-12-18 ENCOUNTER — Ambulatory Visit (INDEPENDENT_AMBULATORY_CARE_PROVIDER_SITE_OTHER): Payer: BC Managed Care – PPO | Admitting: Pharmacist Clinician (PhC)/ Clinical Pharmacy Specialist

## 2012-12-18 VITALS — BP 98/70 | HR 76

## 2012-12-18 DIAGNOSIS — I4891 Unspecified atrial fibrillation: Secondary | ICD-10-CM

## 2012-12-18 DIAGNOSIS — I482 Chronic atrial fibrillation, unspecified: Secondary | ICD-10-CM

## 2012-12-18 DIAGNOSIS — Z7901 Long term (current) use of anticoagulants: Secondary | ICD-10-CM

## 2013-01-29 ENCOUNTER — Ambulatory Visit: Payer: BC Managed Care – PPO | Admitting: Pharmacist Clinician (PhC)/ Clinical Pharmacy Specialist

## 2013-02-02 ENCOUNTER — Ambulatory Visit: Payer: BC Managed Care – PPO | Admitting: Pharmacist Clinician (PhC)/ Clinical Pharmacy Specialist

## 2013-02-26 ENCOUNTER — Ambulatory Visit (INDEPENDENT_AMBULATORY_CARE_PROVIDER_SITE_OTHER): Payer: BC Managed Care – PPO | Admitting: Pharmacist Clinician (PhC)/ Clinical Pharmacy Specialist

## 2013-02-26 VITALS — BP 100/66 | HR 76

## 2013-02-26 DIAGNOSIS — Z7901 Long term (current) use of anticoagulants: Secondary | ICD-10-CM

## 2013-02-26 DIAGNOSIS — I4891 Unspecified atrial fibrillation: Secondary | ICD-10-CM

## 2013-02-26 DIAGNOSIS — I482 Chronic atrial fibrillation, unspecified: Secondary | ICD-10-CM

## 2013-02-26 LAB — POCT INR: INR: 2.5

## 2013-04-08 ENCOUNTER — Ambulatory Visit (INDEPENDENT_AMBULATORY_CARE_PROVIDER_SITE_OTHER): Payer: BC Managed Care – PPO | Admitting: Pharmacist Clinician (PhC)/ Clinical Pharmacy Specialist

## 2013-04-08 DIAGNOSIS — Z7901 Long term (current) use of anticoagulants: Secondary | ICD-10-CM

## 2013-04-08 DIAGNOSIS — I482 Chronic atrial fibrillation, unspecified: Secondary | ICD-10-CM

## 2013-04-08 DIAGNOSIS — I4891 Unspecified atrial fibrillation: Secondary | ICD-10-CM

## 2013-04-08 LAB — POCT INR: INR: 3

## 2013-04-23 ENCOUNTER — Other Ambulatory Visit: Payer: Self-pay | Admitting: *Deleted

## 2013-04-23 ENCOUNTER — Other Ambulatory Visit: Payer: Self-pay | Admitting: Cardiovascular Disease

## 2013-04-23 MED ORDER — DIGOXIN 250 MCG PO TABS: 0.1250 mg | ORAL_TABLET | Freq: Every day | ORAL | Status: DC

## 2013-04-23 MED ORDER — DILTIAZEM HCL ER COATED BEADS 240 MG PO CP24: 240.0000 mg | ORAL_CAPSULE | Freq: Every day | ORAL | Status: DC

## 2013-04-23 NOTE — Telephone Encounter (Signed)
Rx refill sent to patients pharmacy  

## 2013-04-24 ENCOUNTER — Other Ambulatory Visit: Payer: Self-pay | Admitting: Pharmacist Clinician (PhC)/ Clinical Pharmacy Specialist

## 2013-04-24 MED ORDER — WARFARIN SODIUM 5 MG PO TABS: ORAL_TABLET | ORAL | Status: DC

## 2013-05-20 ENCOUNTER — Encounter: Payer: Self-pay | Admitting: Cardiovascular Disease

## 2013-05-20 ENCOUNTER — Ambulatory Visit (INDEPENDENT_AMBULATORY_CARE_PROVIDER_SITE_OTHER): Payer: BC Managed Care – PPO | Admitting: Pharmacist Clinician (PhC)/ Clinical Pharmacy Specialist

## 2013-05-20 ENCOUNTER — Ambulatory Visit (INDEPENDENT_AMBULATORY_CARE_PROVIDER_SITE_OTHER): Payer: BC Managed Care – PPO | Admitting: Cardiovascular Disease

## 2013-05-20 VITALS — BP 142/90 | HR 115 | Ht 69.75 in | Wt 212.0 lb

## 2013-05-20 DIAGNOSIS — Z7901 Long term (current) use of anticoagulants: Secondary | ICD-10-CM

## 2013-05-20 DIAGNOSIS — I4891 Unspecified atrial fibrillation: Secondary | ICD-10-CM

## 2013-05-20 DIAGNOSIS — I482 Chronic atrial fibrillation, unspecified: Secondary | ICD-10-CM

## 2013-05-20 DIAGNOSIS — E785 Hyperlipidemia, unspecified: Secondary | ICD-10-CM

## 2013-05-20 DIAGNOSIS — I10 Essential (primary) hypertension: Secondary | ICD-10-CM

## 2013-05-20 LAB — POCT INR: INR: 2.2

## 2013-05-20 MED ORDER — PRAVASTATIN SODIUM 80 MG PO TABS: ORAL_TABLET | ORAL | Status: DC

## 2013-05-20 MED ORDER — DIGOXIN 250 MCG PO TABS: 0.1250 mg | ORAL_TABLET | Freq: Every day | ORAL | Status: DC

## 2013-05-20 MED ORDER — WARFARIN SODIUM 5 MG PO TABS: ORAL_TABLET | ORAL | Status: DC

## 2013-05-20 MED ORDER — DILTIAZEM HCL ER COATED BEADS 240 MG PO CP24: 240.0000 mg | ORAL_CAPSULE | Freq: Every day | ORAL | Status: DC

## 2013-05-20 MED ORDER — METOPROLOL TARTRATE 25 MG PO TABS: ORAL_TABLET | ORAL | Status: DC

## 2013-05-20 MED ORDER — LOSARTAN POTASSIUM 50 MG PO TABS: ORAL_TABLET | ORAL | Status: DC

## 2013-05-20 NOTE — Assessment & Plan Note (Signed)
Controlled on current medications 

## 2013-05-20 NOTE — Assessment & Plan Note (Signed)
On statin therapy with his most recent lipid profile performed 10/26/12 revealed a total cholesterol of 117, LDL 57 and HDL of 35

## 2013-05-20 NOTE — Assessment & Plan Note (Signed)
Rate controlled on Coumadin anticoagulation 

## 2013-05-20 NOTE — Patient Instructions (Signed)
Your physician wants you to follow-up in: 1 year with Dr Berry. You will receive a reminder letter in the mail two months in advance. If you don't receive a letter, please call our office to schedule the follow-up appointment.  

## 2013-05-20 NOTE — Progress Notes (Signed)
05/20/2013 William Preston   1950-10-14  [id number]  Primary Physician Andres Shad, MD Primary Cardiologist: Lorretta Harp MD [name]   HPI:  The patient is a delightful 63 year old moderately-overweight married Caucasian male, father of 61, whom I last saw in the office 6 months ago. He has a history of chronic AFib, rate controlled, on Coumadin anticoagulation with INRs in the 2 range followed here in the office by Erasmo Downer. He has had 2 failed attempts at cardioversion in the past. He has normal coronary arteries and normal LV function by catheterization performed by me May 25, 2008. His other problems include obstructive sleep apnea and on CPAP, hypertension, hyperlipidemia, and type 2 diabetes. He denies chest pain or shortness of breath.      Current Outpatient Prescriptions  Medication Sig Dispense Refill  . digoxin (LANOXIN) 0.25 MG tablet Take 0.5 tablets (0.125 mg total) by mouth daily.  90 tablet  3  . diltiazem (CARDIZEM CD) 240 MG 24 hr capsule Take 1 capsule (240 mg total) by mouth daily.  90 capsule  3  . doxazosin (CARDURA) 2 MG tablet Take 2 mg by mouth daily.      . furosemide (LASIX) 40 MG tablet Take 0.5 tablets (20 mg total) by mouth daily as needed for edema.  30 tablet    . glyBURIDE-metformin (GLUCOVANCE) 2.5-500 MG per tablet Take 1 tablet by mouth 2 (two) times daily with a meal.      . losartan (COZAAR) 50 MG tablet TAKE 1 BY MOUTH DAILY  90 tablet  3  . metoprolol tartrate (LOPRESSOR) 25 MG tablet TAKE 1/2 BY MOUTH TWICE DAILY  90 tablet  3  . ONE TOUCH ULTRA TEST test strip       . pravastatin (PRAVACHOL) 80 MG tablet TAKE 1 BY MOUTH AT BEDTIME  90 tablet  3  . warfarin (COUMADIN) 5 MG tablet Take 1 to 1 & 1/2 tablets by mouth daily as directed  120 tablet  1   No current facility-administered medications for this visit.    No Known Allergies  History   Social History  . Marital Status: Married    Spouse Name: N/A    Number  of Children: N/A  . Years of Education: N/A   Occupational History  . Not on file.   Social History Main Topics  . Smoking status: Never Smoker   . Smokeless tobacco: Not on file  . Alcohol Use: No  . Drug Use: No  . Sexual Activity: Not on file   Other Topics Concern  . Not on file   Social History Narrative  . No narrative on file     Review of Systems: General: negative for chills, fever, night sweats or weight changes.  Cardiovascular: negative for chest pain, dyspnea on exertion, edema, orthopnea, palpitations, paroxysmal nocturnal dyspnea or shortness of breath Dermatological: negative for rash Respiratory: negative for cough or wheezing Urologic: negative for hematuria Abdominal: negative for nausea, vomiting, diarrhea, bright red blood per rectum, melena, or hematemesis Neurologic: negative for visual changes, syncope, or dizziness All other systems reviewed and are otherwise negative except as noted above.    Blood pressure 142/90, pulse 115, height 5' 9.75" (1.772 m), weight 212 lb (96.163 kg).  General appearance: alert and no distress Neck: no adenopathy, no carotid bruit, no JVD, supple, symmetrical, trachea midline and thyroid not enlarged, symmetric, no tenderness/mass/nodules Lungs: clear to auscultation bilaterally Heart: irregularly irregular rhythm Extremities: extremities normal, atraumatic, no  cyanosis or edema  EKG atrial fibrillation with a ventricular response of 115.  ASSESSMENT AND PLAN:   Chronic atrial fibrillation Rate controlled on Coumadin anticoagulation  Dyslipidemia On statin therapy with his most recent lipid profile performed 10/26/12 revealed a total cholesterol of 117, LDL 57 and HDL of 35  HYPERTENSION, BENIGN Controlled on current medications      Lorretta Harp MD [facility], [facility] 05/20/2013 4:38 PM

## 2013-08-12 ENCOUNTER — Telehealth: Payer: Self-pay | Admitting: Cardiovascular Disease

## 2013-08-12 LAB — PROTIME-INR: INR: 2.5 — AB (ref 0.9–1.1)

## 2013-08-12 NOTE — Telephone Encounter (Signed)
This order will be given to [facility] once received.

## 2013-08-12 NOTE — Telephone Encounter (Signed)
William Preston is calling because she states she was unable to use the standing order dated 05-06/15-11/20/13 . Is asking for a new order to be fax to them .Marland KitchenMarland KitchenWill be faxing a new standing order to [place].   Thanks

## 2013-08-14 ENCOUNTER — Ambulatory Visit (INDEPENDENT_AMBULATORY_CARE_PROVIDER_SITE_OTHER): Payer: BC Managed Care – PPO | Admitting: Pharmacist Clinician (PhC)/ Clinical Pharmacy Specialist

## 2013-08-14 DIAGNOSIS — I4891 Unspecified atrial fibrillation: Secondary | ICD-10-CM

## 2013-08-14 DIAGNOSIS — I482 Chronic atrial fibrillation, unspecified: Secondary | ICD-10-CM

## 2013-08-14 DIAGNOSIS — Z7901 Long term (current) use of anticoagulants: Secondary | ICD-10-CM

## 2013-10-26 ENCOUNTER — Telehealth: Payer: Self-pay | Admitting: Cardiovascular Disease

## 2013-10-26 NOTE — Telephone Encounter (Signed)
Cecille Rubin called in wanting the new ICD codes for this pt.

## 2013-10-26 NOTE — Telephone Encounter (Signed)
Spoke with lori, new ICD 10 code for chronic atrial fib given.

## 2013-10-27 LAB — PROTIME-INR: INR: 2 — AB (ref 0.9–1.1)

## 2013-10-28 ENCOUNTER — Ambulatory Visit (INDEPENDENT_AMBULATORY_CARE_PROVIDER_SITE_OTHER): Payer: BC Managed Care – PPO | Admitting: Pharmacist Clinician (PhC)/ Clinical Pharmacy Specialist

## 2013-10-28 DIAGNOSIS — I482 Chronic atrial fibrillation, unspecified: Secondary | ICD-10-CM

## 2013-12-25 LAB — PROTIME-INR: INR: 2.5 — AB (ref 0.9–1.1)

## 2013-12-29 ENCOUNTER — Ambulatory Visit (INDEPENDENT_AMBULATORY_CARE_PROVIDER_SITE_OTHER): Payer: BC Managed Care – PPO | Admitting: Pharmacist Clinician (PhC)/ Clinical Pharmacy Specialist

## 2013-12-29 DIAGNOSIS — I482 Chronic atrial fibrillation, unspecified: Secondary | ICD-10-CM

## 2014-02-23 LAB — PROTIME-INR: INR: 2.5 — AB (ref <=1.1)

## 2014-02-24 ENCOUNTER — Ambulatory Visit (INDEPENDENT_AMBULATORY_CARE_PROVIDER_SITE_OTHER): Payer: Self-pay | Admitting: Pharmacist Clinician (PhC)/ Clinical Pharmacy Specialist

## 2014-02-24 DIAGNOSIS — I482 Chronic atrial fibrillation, unspecified: Secondary | ICD-10-CM

## 2014-04-12 ENCOUNTER — Other Ambulatory Visit: Payer: Self-pay | Admitting: Cardiovascular Disease

## 2014-04-12 MED ORDER — WARFARIN SODIUM 5 MG PO TABS: ORAL_TABLET | ORAL | Status: DC

## 2014-04-12 NOTE — Telephone Encounter (Signed)
Rx(s) sent to pharmacy electronically. Patient notified he needs to have INR checked.  Appointment with Dr. Gwenlyn Found scheduled for yearly OV on 06/15/14

## 2014-04-12 NOTE — Telephone Encounter (Signed)
°  1. Which medications need to be refilled? Warfarin  2. Which pharmacy is medication to be sent to?[facility]-[phone] 3. Do they need a 30 day or 90 day supply? 90 and refills 4. Would they like a call back once the medication has been sent to the pharmacy? no

## 2014-04-13 LAB — PROTIME-INR: INR: 2.3 — AB (ref <=1.1)

## 2014-04-16 ENCOUNTER — Ambulatory Visit (INDEPENDENT_AMBULATORY_CARE_PROVIDER_SITE_OTHER): Payer: Self-pay | Admitting: Pharmacist Clinician (PhC)/ Clinical Pharmacy Specialist

## 2014-04-16 DIAGNOSIS — I482 Chronic atrial fibrillation, unspecified: Secondary | ICD-10-CM

## 2014-05-28 ENCOUNTER — Ambulatory Visit (INDEPENDENT_AMBULATORY_CARE_PROVIDER_SITE_OTHER): Payer: Self-pay | Admitting: Pharmacist Clinician (PhC)/ Clinical Pharmacy Specialist

## 2014-05-28 DIAGNOSIS — I482 Chronic atrial fibrillation, unspecified: Secondary | ICD-10-CM

## 2014-05-28 LAB — PROTIME-INR: INR: 2.3 — AB (ref 0.9–1.1)

## 2014-06-10 ENCOUNTER — Other Ambulatory Visit: Payer: Self-pay | Admitting: Cardiovascular Disease

## 2014-06-10 NOTE — Telephone Encounter (Signed)
°  1. Which medications need to be refilled? Pravastatin, Warfarin, Losartan, Digoxin  2. Which pharmacy is medication to be sent to?[facility]   3. Do they need a 30 day or 90 day supply? 90  4. Would they like a call back once the medication has been sent to the pharmacy? [place]

## 2014-06-11 MED ORDER — DIGOXIN 250 MCG PO TABS: 0.1250 mg | ORAL_TABLET | Freq: Every day | ORAL | Status: DC

## 2014-06-11 MED ORDER — LOSARTAN POTASSIUM 50 MG PO TABS: ORAL_TABLET | ORAL | Status: DC

## 2014-06-11 MED ORDER — WARFARIN SODIUM 5 MG PO TABS: ORAL_TABLET | ORAL | Status: DC

## 2014-06-11 MED ORDER — PRAVASTATIN SODIUM 80 MG PO TABS: ORAL_TABLET | ORAL | Status: DC

## 2014-06-11 NOTE — Telephone Encounter (Signed)
MEDS REFILLED ELECTRONICALLY .

## 2014-06-15 ENCOUNTER — Ambulatory Visit: Payer: BLUE CROSS/BLUE SHIELD

## 2014-06-15 ENCOUNTER — Encounter: Payer: Self-pay | Admitting: Cardiovascular Disease

## 2014-06-15 ENCOUNTER — Ambulatory Visit (INDEPENDENT_AMBULATORY_CARE_PROVIDER_SITE_OTHER): Payer: BLUE CROSS/BLUE SHIELD | Admitting: Cardiovascular Disease

## 2014-06-15 VITALS — BP 120/74 | HR 72 | Ht 69.75 in | Wt 210.3 lb

## 2014-06-15 DIAGNOSIS — I482 Chronic atrial fibrillation, unspecified: Secondary | ICD-10-CM

## 2014-06-15 DIAGNOSIS — I4891 Unspecified atrial fibrillation: Secondary | ICD-10-CM

## 2014-06-15 DIAGNOSIS — I10 Essential (primary) hypertension: Secondary | ICD-10-CM | POA: Diagnosis not present

## 2014-06-15 DIAGNOSIS — E785 Hyperlipidemia, unspecified: Secondary | ICD-10-CM

## 2014-06-15 DIAGNOSIS — Z7901 Long term (current) use of anticoagulants: Secondary | ICD-10-CM

## 2014-06-15 NOTE — Assessment & Plan Note (Signed)
History of hypertension blood pressure measured 120/74.  He is on diltiazem, losartan and metoprolol. Continue current meds at current dosing

## 2014-06-15 NOTE — Patient Instructions (Signed)
Your physician wants you to follow-up in: 1 year with Dr Berry. You will receive a reminder letter in the mail two months in advance. If you don't receive a letter, please call our office to schedule the follow-up appointment.  

## 2014-06-15 NOTE — Assessment & Plan Note (Signed)
History of chronic A. Fib rate controlled on Coumadin anticoagulation. His INRs are followed by his PCP

## 2014-06-15 NOTE — Progress Notes (Signed)
06/15/2014 William Preston   August 09, 1950  [id number]  Primary Physician William Shad, MD Primary Cardiologist: William Harp MD William Preston   HPI:   The patient is a delightful 64 year old moderately-overweight married Caucasian male, father of 33, whom I last saw in the office 12 months ago. He has a history of chronic AFib, rate controlled, on Coumadin anticoagulation with INRs in the 2 range followed here in the office by William Preston. He has had 2 failed attempts at cardioversion in the past. He has normal coronary arteries and normal LV function by catheterization performed by me May 25, 2008. His other problems include obstructive sleep apnea and on CPAP, hypertension, hyperlipidemia, and type 2 diabetes. He denies chest pain or shortness of breath. Since I seen him a year ago he's remained currently stable.   Current Outpatient Prescriptions  Medication Sig Dispense Refill  . digoxin (LANOXIN) 0.25 MG tablet Take 0.5 tablets (0.125 mg total) by mouth daily. 90 tablet 0  . diltiazem (CARDIZEM CD) 240 MG 24 hr capsule Take 1 capsule (240 mg total) by mouth daily. 90 capsule 3  . doxazosin (CARDURA) 2 MG tablet Take 2 mg by mouth daily.    . furosemide (LASIX) 40 MG tablet Take 0.5 tablets (20 mg total) by mouth daily as needed for edema. 30 tablet   . glyBURIDE-metformin (GLUCOVANCE) 2.5-500 MG per tablet Take 1 tablet by mouth 2 (two) times daily with a meal.    . losartan (COZAAR) 50 MG tablet TAKE 1 BY MOUTH DAILY 90 tablet 0  . metoprolol tartrate (LOPRESSOR) 25 MG tablet TAKE 1/2 BY MOUTH TWICE DAILY 90 tablet 3  . ONE TOUCH ULTRA TEST test strip     . pravastatin (PRAVACHOL) 80 MG tablet TAKE 1 BY MOUTH AT BEDTIME 90 tablet 0  . warfarin (COUMADIN) 5 MG tablet Take 1 to 1 & 1/2 tablets by mouth daily as directed. 120 tablet 0   No current facility-administered medications for this visit.    No Known Allergies  History   Social History  . Marital Status:  Married    Spouse Name: N/A  . Number of Children: N/A  . Years of Education: N/A   Occupational History  . Not on file.   Social History Main Topics  . Smoking status: Never Smoker   . Smokeless tobacco: Not on file  . Alcohol Use: No  . Drug Use: No  . Sexual Activity: Not on file   Other Topics Concern  . Not on file   Social History Narrative     Review of Systems: General: negative for chills, fever, night sweats or weight changes.  Cardiovascular: negative for chest pain, dyspnea on exertion, edema, orthopnea, palpitations, paroxysmal nocturnal dyspnea or shortness of breath Dermatological: negative for rash Respiratory: negative for cough or wheezing Urologic: negative for hematuria Abdominal: negative for nausea, vomiting, diarrhea, bright red blood per rectum, melena, or hematemesis Neurologic: negative for visual changes, syncope, or dizziness All other systems reviewed and are otherwise negative except as noted above.    Blood pressure 120/74, pulse 72, height 5' 9.75" (1.772 m), weight 210 lb 4.8 oz (95.391 kg).  General appearance: alert and no distress Neck: no adenopathy, no carotid bruit, no JVD, supple, symmetrical, trachea midline and thyroid not enlarged, symmetric, no tenderness/mass/nodules Lungs: clear to auscultation bilaterally Heart: regular rate and rhythm, S1, S2 normal, no murmur, click, rub or gallop Extremities: extremities normal, atraumatic, no cyanosis or edema  EKG atrial  fibrillation with a ventricular response of 72. Recent review this EKG  ASSESSMENT AND PLAN:   HYPERTENSION, BENIGN History of hypertension blood pressure measured 120/74.  He is on diltiazem, losartan and metoprolol. Continue current meds at current dosing   Dyslipidemia History of dyslipidemia on pravastatin 80 mg a day followed by his PCP   Chronic atrial fibrillation History of chronic A. Fib rate controlled on Coumadin anticoagulation. His INRs are followed  by his PCP       William Harp MD [facility], [facility] 06/15/2014 9:39 AM

## 2014-06-15 NOTE — Assessment & Plan Note (Signed)
History of dyslipidemia on pravastatin 80 mg a day followed by his PCP

## 2014-06-24 ENCOUNTER — Other Ambulatory Visit: Payer: Self-pay | Admitting: Cardiovascular Disease

## 2014-07-02 ENCOUNTER — Telehealth: Payer: Self-pay | Admitting: Cardiovascular Disease

## 2014-07-02 NOTE — Telephone Encounter (Signed)
Called, spoke to patient. He was at work today, had BP and HR check, noted HR was 123, irregular. Noticed the HR being elevated, but o/w no symptoms. Notes HR is "always irregular".   Called A Fib clinic to see if availability today - Stacy informed me Butch Penny out of office today.  Spoke to Dr. Martinique, who advised extra dose of metoprolol today, should convert back - have patient follow up next week.  Called patient back, gave him instructions based on Dr. Doug Sou recommendations - he voiced agreement w/ plan. Informed him on-call provider available over weekend if he develops problems, has symptoms or concerns, or HR remains high. Advised to call back next week to update.   Pt voiced understanding.

## 2014-07-02 NOTE — Telephone Encounter (Signed)
Heart rate is 123,very irregular. Vlood pressure 126/72. Please call asap to advise.

## 2014-07-21 ENCOUNTER — Other Ambulatory Visit: Payer: Self-pay | Admitting: *Deleted

## 2014-07-21 MED ORDER — PRAVASTATIN SODIUM 80 MG PO TABS: 80.0000 mg | ORAL_TABLET | Freq: Every day | ORAL | Status: DC

## 2014-07-21 MED ORDER — LOSARTAN POTASSIUM 50 MG PO TABS: 50.0000 mg | ORAL_TABLET | Freq: Every day | ORAL | Status: DC

## 2014-07-21 MED ORDER — WARFARIN SODIUM 5 MG PO TABS: ORAL_TABLET | ORAL | Status: DC

## 2014-07-21 NOTE — Telephone Encounter (Signed)
Pravastatin & losartan refilled  Warfarin refill request routed to [facility] - patient is due for INR check

## 2014-08-06 LAB — PROTIME-INR: INR: 2 — AB (ref 0.9–1.1)

## 2014-08-09 ENCOUNTER — Ambulatory Visit (INDEPENDENT_AMBULATORY_CARE_PROVIDER_SITE_OTHER): Payer: BLUE CROSS/BLUE SHIELD | Admitting: Pharmacist Clinician (PhC)/ Clinical Pharmacy Specialist

## 2014-08-09 DIAGNOSIS — I482 Chronic atrial fibrillation, unspecified: Secondary | ICD-10-CM

## 2014-10-06 LAB — PROTIME-INR: INR: 2.2 — AB (ref 0.9–1.1)

## 2014-10-07 ENCOUNTER — Ambulatory Visit (INDEPENDENT_AMBULATORY_CARE_PROVIDER_SITE_OTHER): Payer: BLUE CROSS/BLUE SHIELD | Admitting: Pharmacist Clinician (PhC)/ Clinical Pharmacy Specialist

## 2014-10-07 DIAGNOSIS — I482 Chronic atrial fibrillation, unspecified: Secondary | ICD-10-CM

## 2014-12-07 ENCOUNTER — Ambulatory Visit (INDEPENDENT_AMBULATORY_CARE_PROVIDER_SITE_OTHER): Payer: BLUE CROSS/BLUE SHIELD | Admitting: Pharmacist Clinician (PhC)/ Clinical Pharmacy Specialist

## 2014-12-07 DIAGNOSIS — I482 Chronic atrial fibrillation, unspecified: Secondary | ICD-10-CM

## 2014-12-07 DIAGNOSIS — Z7901 Long term (current) use of anticoagulants: Secondary | ICD-10-CM

## 2014-12-07 LAB — PROTIME-INR: INR: 2.5 — AB (ref 0.9–1.1)

## 2015-01-14 LAB — PROTIME-INR: INR: 2.1 — AB (ref 0.9–1.1)

## 2015-01-21 ENCOUNTER — Ambulatory Visit (INDEPENDENT_AMBULATORY_CARE_PROVIDER_SITE_OTHER): Payer: BLUE CROSS/BLUE SHIELD | Admitting: Pharmacist Clinician (PhC)/ Clinical Pharmacy Specialist

## 2015-01-21 DIAGNOSIS — I482 Chronic atrial fibrillation, unspecified: Secondary | ICD-10-CM

## 2015-01-21 DIAGNOSIS — Z7901 Long term (current) use of anticoagulants: Secondary | ICD-10-CM

## 2015-01-21 MED ORDER — WARFARIN SODIUM 5 MG PO TABS: ORAL_TABLET | ORAL | Status: DC

## 2015-01-25 ENCOUNTER — Telehealth: Payer: Self-pay | Admitting: Cardiovascular Disease

## 2015-01-25 MED ORDER — PRAVASTATIN SODIUM 80 MG PO TABS: 80.0000 mg | ORAL_TABLET | Freq: Every day | ORAL | Status: DC

## 2015-01-25 MED ORDER — DILTIAZEM HCL ER COATED BEADS 240 MG PO CP24: ORAL_CAPSULE | ORAL | Status: DC

## 2015-01-25 MED ORDER — DIGOXIN 250 MCG PO TABS: 0.1250 mg | ORAL_TABLET | Freq: Every day | ORAL | Status: DC

## 2015-01-25 MED ORDER — LOSARTAN POTASSIUM 50 MG PO TABS: 50.0000 mg | ORAL_TABLET | Freq: Every day | ORAL | Status: DC

## 2015-01-25 MED ORDER — METOPROLOL TARTRATE 25 MG PO TABS: ORAL_TABLET | ORAL | Status: DC

## 2015-01-25 NOTE — Telephone Encounter (Signed)
Refill sent to the pharmacy electronically.  

## 2015-01-25 NOTE — Telephone Encounter (Signed)
Sharyn Lull from [facility] is calling because they need new prescriptions for: Pravastatin 80mg , Metoprolol 25mg , Losartan 50mg  and Cartia XT 240 mg /24hr release capsule . They Need new Prescriptions because the patient is witching Pharmacies.   Thanks

## 2015-01-25 NOTE — Telephone Encounter (Signed)
°*  STAT* If patient is at the pharmacy, call can be transferred to refill team.   1. Which medications need to be refilled? (please list name of each medication and dose if known) Metoprolol,Pravastatin,Losartan and Cartia XT_His prescriptions were lost by the mail order pharmacy  2. Which pharmacy/location (including street and city if local pharmacy) is medication to be sent to?[phone]  3. Do they need a 30 day or 90 day supply? 90 days supply for each

## 2015-01-25 NOTE — Telephone Encounter (Signed)
°*  STAT* If patient is at the pharmacy, call can be transferred to refill team.   1. Which medications need to be refilled? (please list name of each medication and dose if known) Digoxin .25 mg   2. Which pharmacy/location (including street and city if local pharmacy) is medication to be sent to? [facility] in [place]   3. Do they need a 30 day or 90 day supply? [place]

## 2015-03-04 ENCOUNTER — Telehealth: Payer: Self-pay | Admitting: Pharmacist Clinician (PhC)/ Clinical Pharmacy Specialist

## 2015-03-04 NOTE — Telephone Encounter (Signed)
New message      Please fax a new order for PT/INR to [facility]---[phone] for a 93mo period.

## 2015-03-04 NOTE — Telephone Encounter (Signed)
Order faxed 03-04-15

## 2015-03-07 ENCOUNTER — Telehealth: Payer: Self-pay | Admitting: Pharmacist Clinician (PhC)/ Clinical Pharmacy Specialist

## 2015-03-07 NOTE — Telephone Encounter (Signed)
New message     Talk to William Preston about his INR schedule

## 2015-03-07 NOTE — Telephone Encounter (Signed)
Pt states lab did not receive faxed orders from Friday.  Refaxed today and saw that fax completed.  Mailed order to patient as well.

## 2015-03-14 LAB — PROTIME-INR: INR: 2.6 — AB (ref 0.9–1.1)

## 2015-03-15 ENCOUNTER — Ambulatory Visit (INDEPENDENT_AMBULATORY_CARE_PROVIDER_SITE_OTHER): Payer: BLUE CROSS/BLUE SHIELD | Admitting: Pharmacist Clinician (PhC)/ Clinical Pharmacy Specialist

## 2015-03-15 DIAGNOSIS — I482 Chronic atrial fibrillation, unspecified: Secondary | ICD-10-CM

## 2015-03-15 DIAGNOSIS — Z7901 Long term (current) use of anticoagulants: Secondary | ICD-10-CM

## 2015-05-23 ENCOUNTER — Telehealth: Payer: Self-pay | Admitting: Cardiovascular Disease

## 2015-05-23 NOTE — Telephone Encounter (Signed)
Request for surgical clearance:  1. What type of surgery is being performed?  Cystoscopy, removal of bladder stone   2. When is this surgery scheduled? n/a  3. Are there any medications that need to be held prior to surgery and how long? coum 3-5 days- if general anesthesia is fine   4. Name of physician performing surgery? Harle Stanford   5. What is your office phone and fax number? [phone]

## 2015-05-23 NOTE — Telephone Encounter (Signed)
Patient has appointment June 7th 2017 with Dr. Gwenlyn Found

## 2015-05-27 NOTE — Telephone Encounter (Signed)
Okay to hold Coumadin as well as have general anesthesia for his cystoscopy

## 2015-05-30 NOTE — Telephone Encounter (Signed)
Note printed and faxed.

## 2015-06-01 ENCOUNTER — Telehealth: Payer: Self-pay | Admitting: Cardiovascular Disease

## 2015-06-01 LAB — PROTIME-INR: INR: 2.3 — AB (ref 0.9–1.1)

## 2015-06-01 NOTE — Telephone Encounter (Signed)
New message     Mickel Baas is calling to have notes faxed over form last office visit and the visit on June 7 th along with the EKG, this is with Dr. Gwenlyn Found. Fax number [phone]

## 2015-06-01 NOTE — Telephone Encounter (Signed)
New message     The pt had blood done today had INR checked and he just wanted to tell Minette Headland to continue monitoring the blood work.

## 2015-06-01 NOTE — Telephone Encounter (Signed)
Message routed to [place], PharmD

## 2015-06-02 ENCOUNTER — Ambulatory Visit (INDEPENDENT_AMBULATORY_CARE_PROVIDER_SITE_OTHER): Payer: BLUE CROSS/BLUE SHIELD | Admitting: Pharmacist

## 2015-06-02 DIAGNOSIS — I482 Chronic atrial fibrillation, unspecified: Secondary | ICD-10-CM

## 2015-06-02 DIAGNOSIS — Z7901 Long term (current) use of anticoagulants: Secondary | ICD-10-CM

## 2015-06-07 NOTE — Progress Notes (Signed)
LMOM for patient that it is okay to hold x 5 days prior to procedure, restart day of or day after at discretion of surgeon.  Advised to repeat INR 7-10 days after procedure

## 2015-06-22 ENCOUNTER — Ambulatory Visit: Payer: BLUE CROSS/BLUE SHIELD | Admitting: Cardiovascular Disease

## 2015-07-01 ENCOUNTER — Ambulatory Visit (INDEPENDENT_AMBULATORY_CARE_PROVIDER_SITE_OTHER): Payer: Medicare Other | Admitting: Cardiovascular Disease

## 2015-07-01 ENCOUNTER — Encounter: Payer: Self-pay | Admitting: Cardiovascular Disease

## 2015-07-01 VITALS — BP 130/75 | HR 76 | Ht 69.0 in | Wt 209.6 lb

## 2015-07-01 DIAGNOSIS — I4891 Unspecified atrial fibrillation: Secondary | ICD-10-CM

## 2015-07-01 DIAGNOSIS — I159 Secondary hypertension, unspecified: Secondary | ICD-10-CM

## 2015-07-01 DIAGNOSIS — I482 Chronic atrial fibrillation, unspecified: Secondary | ICD-10-CM

## 2015-07-01 DIAGNOSIS — E785 Hyperlipidemia, unspecified: Secondary | ICD-10-CM | POA: Diagnosis not present

## 2015-07-01 NOTE — Assessment & Plan Note (Signed)
History of hypertension blood pressure measurements at 130/75He is on losartan and metoprolol. Continue current meds at current dosing

## 2015-07-01 NOTE — Assessment & Plan Note (Signed)
History of hyperlipidemia on statin therapy followed by his PCP 

## 2015-07-01 NOTE — Patient Instructions (Signed)

## 2015-07-01 NOTE — Assessment & Plan Note (Signed)
History of chronic A. Fib rate controlled on Coumadin and a coagulation. He scheduled to have a bladder stone removed surgically and hand drill. He can stop his Coumadin 5 days prior to the procedure and restarted postoperatively.

## 2015-07-01 NOTE — Progress Notes (Signed)
07/01/2015 [name]   October 30, 1950  [id number]  Primary Physician Andres Shad, MD Primary Cardiologist: Lorretta Harp MD [name]  HPI:  The patient is a delightful 65 year old moderately-overweight married Caucasian male, father of 50, whom I last saw in the office /31/16. He has a history of chronic AFib, rate controlled, on Coumadin anticoagulation with INRs in the 2 range followed here in the office by Erasmo Downer. He has had 2 failed attempts at cardioversion in the past. He has normal coronary arteries and normal LV function by catheterization performed by me May 25, 2008. His other problems include obstructive sleep apnea and on CPAP, hypertension, hyperlipidemia, and type 2 diabetes. He denies chest pain or shortness of breath. Since I seen him a year ago he's remained currently stable. He apparently needs a bladder stone surgically removed which she can have done a low cardiac vascular risk. He can stop his Coumadin 5 days prior to the procedure.   Current Outpatient Prescriptions  Medication Sig Dispense Refill  . digoxin (LANOXIN) 0.25 MG tablet Take 0.5 tablets (0.125 mg total) by mouth daily. 90 tablet 0  . diltiazem (CARTIA XT) 240 MG 24 hr capsule TAKE 1 BY MOUTH DAILY 90 capsule 3  . doxazosin (CARDURA) 2 MG tablet Take 2 mg by mouth daily.    . furosemide (LASIX) 40 MG tablet Take 0.5 tablets (20 mg total) by mouth daily as needed for edema. 30 tablet   . glyBURIDE-metformin (GLUCOVANCE) 2.5-500 MG per tablet Take 1 tablet by mouth 2 (two) times daily with a meal.    . losartan (COZAAR) 50 MG tablet Take 1 tablet (50 mg total) by mouth daily. 90 tablet 3  . metoprolol tartrate (LOPRESSOR) 25 MG tablet TAKE 1/2 BY MOUTH 2 TIMES DAILY GENERIC FOR LOPRESSOR 90 tablet 3  . ONE TOUCH ULTRA TEST test strip     . pravastatin (PRAVACHOL) 80 MG tablet Take 1 tablet (80 mg total) by mouth daily. 90 tablet 3  . warfarin (COUMADIN) 5 MG tablet Take 1 to 1 &  1/2 tablets by mouth daily as directed. 120 tablet 1   No current facility-administered medications for this visit.    No Known Allergies  Social History   Social History  . Marital Status: Married    Spouse Name: N/A  . Number of Children: N/A  . Years of Education: N/A   Occupational History  . Not on file.   Social History Main Topics  . Smoking status: Never Smoker   . Smokeless tobacco: Not on file  . Alcohol Use: No  . Drug Use: No  . Sexual Activity: Not on file   Other Topics Concern  . Not on file   Social History Narrative     Review of Systems: General: negative for chills, fever, night sweats or weight changes.  Cardiovascular: negative for chest pain, dyspnea on exertion, edema, orthopnea, palpitations, paroxysmal nocturnal dyspnea or shortness of breath Dermatological: negative for rash Respiratory: negative for cough or wheezing Urologic: negative for hematuria Abdominal: negative for nausea, vomiting, diarrhea, bright red blood per rectum, melena, or hematemesis Neurologic: negative for visual changes, syncope, or dizziness All other systems reviewed and are otherwise negative except as noted above.    Blood pressure 130/75, pulse 76, height 5\' 9"  (1.753 m), weight 209 lb 9.6 oz (95.074 kg).  General appearance: alert and no distress Neck: no adenopathy, no carotid bruit, no JVD, supple, symmetrical, trachea midline and thyroid  not enlarged, symmetric, no tenderness/mass/nodules Lungs: clear to auscultation bilaterally Heart: irregularly irregular rhythm Extremities: extremities normal, atraumatic, no cyanosis or edema  EKG Atrial fibrillation with a ventricular response of 76 and septal Q waves. I personally reviewed this EKG.  ASSESSMENT AND PLAN:   HYPERTENSION, BENIGN History of hypertension blood pressure measurements at 130/75He is on losartan and metoprolol. Continue current meds at current dosing  Chronic atrial fibrillation History of  chronic A. Fib rate controlled on Coumadin and a coagulation. He scheduled to have a bladder stone removed surgically and hand drill. He can stop his Coumadin 5 days prior to the procedure and restarted postoperatively.  Dyslipidemia History of hyperlipidemia on statin therapy followed by his PCP      Lorretta Harp MD [facility], [facility] 07/01/2015 10:18 AM

## 2015-07-15 NOTE — Telephone Encounter (Signed)
Routed to number provided via EPIC. 

## 2015-07-29 ENCOUNTER — Other Ambulatory Visit: Payer: Self-pay | Admitting: Cardiovascular Disease

## 2015-07-29 NOTE — Telephone Encounter (Signed)
Overdue for follow up appointment, please call the office to schedule an appointment for any future refills. [phone].

## 2015-07-29 NOTE — Telephone Encounter (Signed)
Rx has been sent to the pharmacy electronically. ° °

## 2015-08-02 ENCOUNTER — Telehealth: Payer: Self-pay | Admitting: Cardiovascular Disease

## 2015-08-02 NOTE — Telephone Encounter (Signed)
He is on Cardura, an Alpha blocker, and therefore can not take Viagra

## 2015-08-02 NOTE — Telephone Encounter (Signed)
New message      Please fax [facility] PT/INR standing order.  Please fax order to [phone]

## 2015-08-02 NOTE — Telephone Encounter (Signed)
Standing order faxed to number given for weekly tests X36months.

## 2015-08-02 NOTE — Telephone Encounter (Signed)
New MEssage  Pt requested to speak w/ RN about possibly being prescribed Viagra. Please call back and discuss.

## 2015-08-02 NOTE — Telephone Encounter (Signed)
Returned call to patient.He stated William Every NP at [facility] in [place] will prescribe Viagra, but she wanted to ask William Preston if ok.He stated if William Preston approves give him a call back and call William Preston at # [phone].Message sent to [facility].

## 2015-08-03 NOTE — Telephone Encounter (Signed)
Returned call to patient spoke to wife.Dr.Berry's advice given.

## 2015-08-04 LAB — POCT INR: INR: 2.5

## 2015-08-04 LAB — PROTIME-INR

## 2015-08-05 ENCOUNTER — Ambulatory Visit (INDEPENDENT_AMBULATORY_CARE_PROVIDER_SITE_OTHER): Payer: PRIVATE HEALTH INSURANCE | Admitting: Pharmacist

## 2015-08-05 DIAGNOSIS — Z7901 Long term (current) use of anticoagulants: Secondary | ICD-10-CM

## 2015-08-05 DIAGNOSIS — I482 Chronic atrial fibrillation, unspecified: Secondary | ICD-10-CM

## 2015-08-11 ENCOUNTER — Encounter: Payer: Self-pay | Admitting: Cardiovascular Disease

## 2015-10-04 LAB — PROTIME-INR: INR: 1.9 — AB (ref 0.9–1.1)

## 2015-10-06 ENCOUNTER — Ambulatory Visit (INDEPENDENT_AMBULATORY_CARE_PROVIDER_SITE_OTHER): Payer: PRIVATE HEALTH INSURANCE | Admitting: Pharmacist Clinician (PhC)/ Clinical Pharmacy Specialist

## 2015-10-06 DIAGNOSIS — I482 Chronic atrial fibrillation, unspecified: Secondary | ICD-10-CM

## 2015-10-06 DIAGNOSIS — Z7901 Long term (current) use of anticoagulants: Secondary | ICD-10-CM

## 2015-11-08 ENCOUNTER — Other Ambulatory Visit: Payer: Self-pay | Admitting: Pharmacist Clinician (PhC)/ Clinical Pharmacy Specialist

## 2015-11-08 MED ORDER — WARFARIN SODIUM 5 MG PO TABS: ORAL_TABLET | ORAL | 0 refills | Status: DC

## 2015-11-14 LAB — PROTIME-INR: INR: 2.4 — AB (ref 0.9–1.1)

## 2015-11-15 ENCOUNTER — Ambulatory Visit (INDEPENDENT_AMBULATORY_CARE_PROVIDER_SITE_OTHER): Payer: PRIVATE HEALTH INSURANCE | Admitting: Pharmacist

## 2015-11-15 DIAGNOSIS — I482 Chronic atrial fibrillation, unspecified: Secondary | ICD-10-CM

## 2016-01-23 ENCOUNTER — Other Ambulatory Visit: Payer: Self-pay | Admitting: *Deleted

## 2016-01-23 LAB — PROTIME-INR: INR: 2.1 — AB (ref <=1.1)

## 2016-01-24 ENCOUNTER — Ambulatory Visit (INDEPENDENT_AMBULATORY_CARE_PROVIDER_SITE_OTHER): Payer: PRIVATE HEALTH INSURANCE | Admitting: Pharmacist Clinician (PhC)/ Clinical Pharmacy Specialist

## 2016-01-24 DIAGNOSIS — I482 Chronic atrial fibrillation, unspecified: Secondary | ICD-10-CM

## 2016-01-24 MED ORDER — WARFARIN SODIUM 5 MG PO TABS: ORAL_TABLET | ORAL | 0 refills | Status: DC

## 2016-01-24 MED ORDER — METOPROLOL TARTRATE 25 MG PO TABS: ORAL_TABLET | ORAL | 2 refills | Status: DC

## 2016-01-24 MED ORDER — PRAVASTATIN SODIUM 80 MG PO TABS: 80.0000 mg | ORAL_TABLET | Freq: Every day | ORAL | 2 refills | Status: DC

## 2016-01-24 MED ORDER — LOSARTAN POTASSIUM 50 MG PO TABS: 50.0000 mg | ORAL_TABLET | Freq: Every day | ORAL | 2 refills | Status: DC

## 2016-01-24 MED ORDER — DILTIAZEM HCL ER COATED BEADS 240 MG PO CP24: ORAL_CAPSULE | ORAL | 2 refills | Status: DC

## 2016-03-07 ENCOUNTER — Ambulatory Visit (INDEPENDENT_AMBULATORY_CARE_PROVIDER_SITE_OTHER): Payer: PRIVATE HEALTH INSURANCE | Admitting: Pharmacist

## 2016-03-07 DIAGNOSIS — I482 Chronic atrial fibrillation, unspecified: Secondary | ICD-10-CM

## 2016-03-07 LAB — PROTIME-INR: INR: 2.7 — AB (ref 0.9–1.1)

## 2016-04-03 ENCOUNTER — Telehealth: Payer: Self-pay | Admitting: Cardiovascular Disease

## 2016-04-03 NOTE — Telephone Encounter (Signed)
Patient calling and would like to verify that it is okay to take Turmeric for joint pain and would like to know if it will interfere or affect coumadin. Please call, thanks.

## 2016-04-03 NOTE — Telephone Encounter (Signed)
LMOM; Tumeric may increase risk of bleeding.   Recommendation:   1. Avoid Tumeric if possible.   2. If new medication started, please check INR 3-5 days after 1st dose of new medication.  Patient to call back and make appointment for INR check (due 04/17/16)

## 2016-04-04 NOTE — Telephone Encounter (Signed)
Lm2cb 

## 2016-04-05 NOTE — Telephone Encounter (Signed)
Thank you. We will follow once INR available.

## 2016-04-05 NOTE — Telephone Encounter (Signed)
Pt notified he will go to the lab near his home for INR on monday

## 2016-04-06 ENCOUNTER — Other Ambulatory Visit: Payer: Self-pay | Admitting: *Deleted

## 2016-04-06 MED ORDER — PRAVASTATIN SODIUM 80 MG PO TABS: 80.0000 mg | ORAL_TABLET | Freq: Every day | ORAL | 3 refills | Status: DC

## 2016-04-06 NOTE — Telephone Encounter (Signed)
E SENT TO PHARMACY 

## 2016-04-12 ENCOUNTER — Other Ambulatory Visit: Payer: Self-pay | Admitting: Cardiovascular Disease

## 2016-04-23 LAB — PROTIME-INR: INR: 1.8 — AB (ref 0.9–1.1)

## 2016-04-24 ENCOUNTER — Ambulatory Visit (INDEPENDENT_AMBULATORY_CARE_PROVIDER_SITE_OTHER): Payer: PRIVATE HEALTH INSURANCE | Admitting: Pharmacist Clinician (PhC)/ Clinical Pharmacy Specialist

## 2016-04-24 DIAGNOSIS — I482 Chronic atrial fibrillation, unspecified: Secondary | ICD-10-CM

## 2016-05-08 ENCOUNTER — Encounter: Payer: Self-pay | Admitting: Cardiovascular Disease

## 2016-05-08 ENCOUNTER — Other Ambulatory Visit: Payer: Self-pay | Admitting: Cardiovascular Disease

## 2016-05-08 MED ORDER — DIGOXIN 250 MCG PO TABS: 125.0000 ug | ORAL_TABLET | Freq: Every day | ORAL | 3 refills | Status: DC

## 2016-05-08 NOTE — Telephone Encounter (Signed)
°*  STAT* If patient is at the pharmacy, call can be transferred to refill team.   1. Which medications need to be refilled? (please list name of each medication and dose if known) Digox 250 MCG  2. Which pharmacy/location (including street and city if local pharmacy) is medication to be sent to?[id number] - [place], [address] AT [address]   3. Do they need a 30 day or 90 day supply? 90   Patient states that he recently only received a 30 day supply for this medication and needs a 90 day supply instead, as he is charged the same price for 30 and 90 day supply. Thanks.

## 2016-05-08 NOTE — Telephone Encounter (Signed)
Rx request sent to pharmacy.  Pt is supposed to be taking 1/2 tab daily, 45 tablets is a 90 day supply. 3 refills given to cover the year.

## 2016-05-22 LAB — PROTIME-INR: INR: 2.2 — AB (ref 0.9–1.1)

## 2016-05-23 ENCOUNTER — Ambulatory Visit (INDEPENDENT_AMBULATORY_CARE_PROVIDER_SITE_OTHER): Payer: Medicare Other | Admitting: Pharmacist

## 2016-05-23 DIAGNOSIS — I482 Chronic atrial fibrillation, unspecified: Secondary | ICD-10-CM

## 2016-05-23 DIAGNOSIS — Z7901 Long term (current) use of anticoagulants: Secondary | ICD-10-CM

## 2016-05-24 ENCOUNTER — Telehealth: Payer: Self-pay | Admitting: Cardiovascular Disease

## 2016-05-24 NOTE — Telephone Encounter (Signed)
Thank you William Preston. I got it yesterday and already talked to patient.

## 2016-05-24 NOTE — Telephone Encounter (Signed)
Received fax form [facility] with INR results. Result is 2.2, and Prothrombin Time is 22.1.  Routing to Dr. Gwenlyn Found and PharmD to advise if any changes to be made.

## 2016-05-29 ENCOUNTER — Telehealth: Payer: Self-pay | Admitting: Cardiovascular Disease

## 2016-05-29 ENCOUNTER — Telehealth: Payer: Self-pay

## 2016-05-29 NOTE — Telephone Encounter (Signed)
Called patient to inform him that a 90 day supply of digoxin was sent to his pharmacy 05/08/16. Patient verbalized understanding.

## 2016-05-29 NOTE — Telephone Encounter (Signed)
New message       *STAT* If patient is at the pharmacy, call can be transferred to refill team.   1. Which medications need to be refilled? (please list name of each medication and dose if known)  Digoxin 0.25 2. Which pharmacy/location (including street and city if local pharmacy) [facility] [place]  3. Do they need a 30 day or 90 day supply? 90 day supple.  Pt did not pick up last presc because it was a 30 day supply.  He has an appt next month to see the doctor

## 2016-07-03 ENCOUNTER — Encounter: Payer: Self-pay | Admitting: Cardiovascular Disease

## 2016-07-03 ENCOUNTER — Ambulatory Visit (INDEPENDENT_AMBULATORY_CARE_PROVIDER_SITE_OTHER): Payer: Medicare Other | Admitting: Cardiovascular Disease

## 2016-07-03 VITALS — BP 130/82 | HR 74 | Ht 69.0 in | Wt 214.6 lb

## 2016-07-03 DIAGNOSIS — I482 Chronic atrial fibrillation, unspecified: Secondary | ICD-10-CM

## 2016-07-03 MED ORDER — DILTIAZEM HCL ER COATED BEADS 240 MG PO CP24: ORAL_CAPSULE | ORAL | 2 refills | Status: DC

## 2016-07-03 MED ORDER — METOPROLOL TARTRATE 25 MG PO TABS: ORAL_TABLET | ORAL | 2 refills | Status: DC

## 2016-07-03 MED ORDER — LOSARTAN POTASSIUM 50 MG PO TABS: 50.0000 mg | ORAL_TABLET | Freq: Every day | ORAL | 2 refills | Status: DC

## 2016-07-03 MED ORDER — PRAVASTATIN SODIUM 80 MG PO TABS: 80.0000 mg | ORAL_TABLET | Freq: Every day | ORAL | 3 refills | Status: DC

## 2016-07-03 MED ORDER — DIGOXIN 250 MCG PO TABS: 125.0000 ug | ORAL_TABLET | Freq: Every day | ORAL | 3 refills | Status: DC

## 2016-07-03 NOTE — Assessment & Plan Note (Signed)
History of obstructive sleep apnea on CPAP. 

## 2016-07-03 NOTE — Progress Notes (Signed)
07/03/2016 William Preston   10/27/1950  [id number]  Primary Physician Andres Shad, MD Primary Cardiologist: Lorretta Harp MD [name]  HPI:  The patient is a delightful 66 year old moderately-overweight married Caucasian male, father of 37, whom I last saw in the office 07/01/15. He has a history of chronic AFib, rate controlled, on Coumadin anticoagulation with INRs in the 2 range followed here in the office by Erasmo Downer. He has had 2 failed attempts at cardioversion in the past. He has normal coronary arteries and normal LV function by catheterization performed by me May 25, 2008. His other problems include obstructive sleep apnea and on CPAP, hypertension, hyperlipidemia, and type 2 diabetes. He denies chest pain or shortness of breath. Since I seen him a year ago he's remained currently stable. He had a bladder stone surgically removed which went without complication. He did stop his Coumadin 5 days prior. Since I saw him last he's remained currently stable. She is planning a three-week trip trip in August up to [place] and the [place].   Current Outpatient Prescriptions  Medication Sig Dispense Refill  . digoxin (DIGOX) 0.25 MG tablet Take 0.5 tablets (125 mcg total) by mouth daily. 45 tablet 3  . diltiazem (CARTIA XT) 240 MG 24 hr capsule TAKE 1 BY MOUTH DAILY 90 capsule 2  . doxazosin (CARDURA) 2 MG tablet Take 2 mg by mouth daily.    . finasteride (PROSCAR) 5 MG tablet Take 5 mg by mouth daily.  0  . furosemide (LASIX) 40 MG tablet Take 0.5 tablets (20 mg total) by mouth daily as needed for edema. 30 tablet   . glyBURIDE-metformin (GLUCOVANCE) 2.5-500 MG per tablet Take 1 tablet by mouth 2 (two) times daily with a meal.    . losartan (COZAAR) 50 MG tablet Take 1 tablet (50 mg total) by mouth daily. 90 tablet 2  . metoprolol tartrate (LOPRESSOR) 25 MG tablet TAKE 1/2 BY MOUTH 2 TIMES DAILY GENERIC FOR LOPRESSOR 90 tablet 2  . ONE TOUCH ULTRA TEST test  strip     . pravastatin (PRAVACHOL) 80 MG tablet Take 1 tablet (80 mg total) by mouth daily. 90 tablet 3  . warfarin (COUMADIN) 5 MG tablet TAKE 1 TO 1 AND 1/2 TABLET BY MOUTH DAILY AS DIRECTED 120 tablet 0   No current facility-administered medications for this visit.     No Known Allergies  Social History   Social History  . Marital status: Married    Spouse name: N/A  . Number of children: N/A  . Years of education: N/A   Occupational History  . Not on file.   Social History Main Topics  . Smoking status: Never Smoker  . Smokeless tobacco: Never Used  . Alcohol use No  . Drug use: No  . Sexual activity: Not on file   Other Topics Concern  . Not on file   Social History Narrative  . No narrative on file     Review of Systems: General: negative for chills, fever, night sweats or weight changes.  Cardiovascular: negative for chest pain, dyspnea on exertion, edema, orthopnea, palpitations, paroxysmal nocturnal dyspnea or shortness of breath Dermatological: negative for rash Respiratory: negative for cough or wheezing Urologic: negative for hematuria Abdominal: negative for nausea, vomiting, diarrhea, bright red blood per rectum, melena, or hematemesis Neurologic: negative for visual changes, syncope, or dizziness All other systems reviewed and are otherwise negative except as noted above.    Blood pressure  130/82, pulse 74, height 5\' 9"  (1.753 m), weight 214 lb 9.6 oz (97.3 kg).  General appearance: alert and no distress Neck: no adenopathy, no carotid bruit, no JVD, supple, symmetrical, trachea midline and thyroid not enlarged, symmetric, no tenderness/mass/nodules Lungs: clear to auscultation bilaterally Heart: irregularly irregular rhythm Extremities: Trace bilateral lower extremity edema.  EKG atrial fibrillation with a ventricular response of 74 and septal Q waves. I personally reviewed this EKG.  ASSESSMENT AND PLAN:   OSA- on C-pap History of  obstructive sleep apnea on CPAP  HYPERTENSION, BENIGN History of essential hypertension blood pressure much is a 130/82. He is on diltiazem, losartan and metoprolol. Continue current meds at current dosing  Chronic atrial fibrillation History of chronic atrial fibrillation rate controlled on Coumadin anticoagulation  Dyslipidemia History of dyslipidemia on statin therapy followed by his PCP      Lorretta Harp MD [facility], [facility] 07/03/2016 12:12 PM

## 2016-07-03 NOTE — Assessment & Plan Note (Signed)
History of chronic atrial fibrillation rate controlled on Coumadin anticoagulation. 

## 2016-07-03 NOTE — Patient Instructions (Signed)

## 2016-07-03 NOTE — Assessment & Plan Note (Signed)
History of essential hypertension blood pressure much is a 130/82. He is on diltiazem, losartan and metoprolol. Continue current meds at current dosing

## 2016-07-03 NOTE — Assessment & Plan Note (Signed)
History of dyslipidemia on statin therapy followed by his PCP 

## 2016-07-06 ENCOUNTER — Telehealth: Payer: Self-pay | Admitting: Cardiovascular Disease

## 2016-07-06 NOTE — Telephone Encounter (Signed)
[id number] [name], [name] and spoke with pharmacy tech, [facility] who confirmed that his Rx is there and waiting for pickup since 07-03-16. Called patient to let him know that the Rx is available and ready at above pharmacy. Patient verbalized understanding.

## 2016-07-06 NOTE — Telephone Encounter (Signed)
New Message      He doesn't have enough to get through weekend *STAT* If patient is at the pharmacy, call can be transferred to refill team.   1. Which medications need to be refilled? (please list name of each medication and dose if known)  digoxin (DIGOX) 0.25 MG tablet Take 0.5 tablets (125 mcg total) by mouth daily.     2. Which pharmacy/location (including street and city if local pharmacy) is medication to be sent to? [facility]  3. Do they need a 30 day or 90 day supply? [place]

## 2016-07-07 ENCOUNTER — Other Ambulatory Visit: Payer: Self-pay | Admitting: Cardiovascular Disease

## 2016-07-09 ENCOUNTER — Other Ambulatory Visit: Payer: Self-pay | Admitting: Cardiovascular Disease

## 2016-07-09 MED ORDER — FUROSEMIDE 40 MG PO TABS: 20.0000 mg | ORAL_TABLET | Freq: Every day | ORAL | 2 refills | Status: DC | PRN

## 2016-07-09 NOTE — Telephone Encounter (Signed)
New message       *STAT* If patient is at the pharmacy, call can be transferred to refill team.   1. Which medications need to be refilled? (please list name of each medication and dose if known)  furosemide (LASIX) 40 MG tablet Take 0.5 tablets (20 mg total) by mouth daily as needed for edema.    2. Which pharmacy/location (including street and city if local pharmacy) is medication to be sent to?  [facility] on [address] [place]  3. Do they need a 30 day or 90 day supply?  [place]

## 2016-07-09 NOTE — Telephone Encounter (Signed)
Rx(s) sent to pharmacy electronically.  

## 2016-07-09 NOTE — Addendum Note (Signed)
Addended by: Zebedee Iba on: 07/09/2016 09:04 AM   Modules accepted: Orders

## 2016-07-16 ENCOUNTER — Other Ambulatory Visit: Payer: Self-pay | Admitting: Pharmacist

## 2016-07-16 LAB — PROTIME-INR: INR: 1.4 — AB (ref 0.9–1.1)

## 2016-07-16 MED ORDER — WARFARIN SODIUM 5 MG PO TABS: ORAL_TABLET | ORAL | 0 refills | Status: DC

## 2016-07-16 MED ORDER — FUROSEMIDE 40 MG PO TABS: 20.0000 mg | ORAL_TABLET | Freq: Every day | ORAL | 2 refills | Status: DC | PRN

## 2016-07-17 ENCOUNTER — Ambulatory Visit (INDEPENDENT_AMBULATORY_CARE_PROVIDER_SITE_OTHER): Payer: Medicare Other | Admitting: Pharmacist

## 2016-07-17 DIAGNOSIS — I482 Chronic atrial fibrillation, unspecified: Secondary | ICD-10-CM

## 2016-07-17 DIAGNOSIS — Z7901 Long term (current) use of anticoagulants: Secondary | ICD-10-CM

## 2016-07-26 LAB — PROTIME-INR: INR: 1.7 — AB (ref 0.9–1.1)

## 2016-07-27 ENCOUNTER — Ambulatory Visit (INDEPENDENT_AMBULATORY_CARE_PROVIDER_SITE_OTHER): Payer: Medicare Other | Admitting: Pharmacist

## 2016-07-27 DIAGNOSIS — Z7901 Long term (current) use of anticoagulants: Secondary | ICD-10-CM

## 2016-07-27 DIAGNOSIS — I482 Chronic atrial fibrillation, unspecified: Secondary | ICD-10-CM

## 2016-08-13 ENCOUNTER — Telehealth: Payer: Self-pay | Admitting: Cardiovascular Disease

## 2016-08-13 DIAGNOSIS — I482 Chronic atrial fibrillation, unspecified: Secondary | ICD-10-CM

## 2016-08-13 DIAGNOSIS — Z7901 Long term (current) use of anticoagulants: Secondary | ICD-10-CM

## 2016-08-13 NOTE — Telephone Encounter (Signed)
Pt needs a new lab order for Coumadin faxed to [facility] in [place].Please fax to [phone].

## 2016-08-14 LAB — PROTIME-INR: INR: 2.1 — AB (ref 0.9–1.1)

## 2016-08-15 ENCOUNTER — Ambulatory Visit (INDEPENDENT_AMBULATORY_CARE_PROVIDER_SITE_OTHER): Payer: Medicare Other | Admitting: Pharmacist

## 2016-08-15 DIAGNOSIS — Z7901 Long term (current) use of anticoagulants: Secondary | ICD-10-CM

## 2016-08-15 DIAGNOSIS — I482 Chronic atrial fibrillation, unspecified: Secondary | ICD-10-CM

## 2016-09-13 ENCOUNTER — Telehealth: Payer: Self-pay | Admitting: Pharmacist Clinician (PhC)/ Clinical Pharmacy Specialist

## 2016-09-13 NOTE — Telephone Encounter (Signed)
Coumadin letter 

## 2016-09-14 LAB — PROTIME-INR: INR: 2.7 — AB (ref 0.9–1.1)

## 2016-09-18 ENCOUNTER — Ambulatory Visit (INDEPENDENT_AMBULATORY_CARE_PROVIDER_SITE_OTHER): Payer: Medicare Other | Admitting: Pharmacist Clinician (PhC)/ Clinical Pharmacy Specialist

## 2016-09-18 DIAGNOSIS — I482 Chronic atrial fibrillation, unspecified: Secondary | ICD-10-CM

## 2016-09-18 DIAGNOSIS — Z7901 Long term (current) use of anticoagulants: Secondary | ICD-10-CM

## 2016-10-23 LAB — PROTIME-INR: INR: 2 — AB (ref 0.9–1.1)

## 2016-10-24 ENCOUNTER — Ambulatory Visit (INDEPENDENT_AMBULATORY_CARE_PROVIDER_SITE_OTHER): Payer: Medicare Other | Admitting: Pharmacist

## 2016-10-24 DIAGNOSIS — I482 Chronic atrial fibrillation, unspecified: Secondary | ICD-10-CM

## 2016-10-24 DIAGNOSIS — Z7901 Long term (current) use of anticoagulants: Secondary | ICD-10-CM | POA: Diagnosis not present

## 2016-10-31 ENCOUNTER — Other Ambulatory Visit: Payer: Self-pay | Admitting: Cardiovascular Disease

## 2016-12-12 LAB — PROTIME-INR: INR: 2.2 — AB (ref 0.9–1.1)

## 2016-12-13 ENCOUNTER — Ambulatory Visit (INDEPENDENT_AMBULATORY_CARE_PROVIDER_SITE_OTHER): Payer: Medicare Other | Admitting: Pharmacist

## 2016-12-13 DIAGNOSIS — Z7901 Long term (current) use of anticoagulants: Secondary | ICD-10-CM

## 2016-12-13 DIAGNOSIS — I482 Chronic atrial fibrillation, unspecified: Secondary | ICD-10-CM

## 2016-12-25 ENCOUNTER — Other Ambulatory Visit: Payer: Self-pay | Admitting: Cardiovascular Disease

## 2017-01-14 LAB — PROTIME-INR
INR: 2.2 — AB (ref <=1.1)
INR: 2.2 — AB (ref <=1.1)

## 2017-01-16 ENCOUNTER — Ambulatory Visit (INDEPENDENT_AMBULATORY_CARE_PROVIDER_SITE_OTHER): Payer: Medicare Other | Admitting: Pharmacist

## 2017-01-16 DIAGNOSIS — Z7901 Long term (current) use of anticoagulants: Secondary | ICD-10-CM

## 2017-01-16 DIAGNOSIS — I482 Chronic atrial fibrillation, unspecified: Secondary | ICD-10-CM

## 2017-01-17 ENCOUNTER — Encounter: Payer: Self-pay | Admitting: Pharmacist Clinician (PhC)/ Clinical Pharmacy Specialist

## 2017-01-17 DIAGNOSIS — Z7901 Long term (current) use of anticoagulants: Secondary | ICD-10-CM

## 2017-01-17 DIAGNOSIS — I482 Chronic atrial fibrillation, unspecified: Secondary | ICD-10-CM

## 2017-01-17 NOTE — Progress Notes (Signed)
This encounter was created in error - please disregard.

## 2017-02-26 LAB — PROTIME-INR: INR: 2.7 — AB (ref 0.9–1.1)

## 2017-02-28 ENCOUNTER — Ambulatory Visit (INDEPENDENT_AMBULATORY_CARE_PROVIDER_SITE_OTHER): Payer: Medicare Other | Admitting: Pharmacist Clinician (PhC)/ Clinical Pharmacy Specialist

## 2017-02-28 DIAGNOSIS — I482 Chronic atrial fibrillation, unspecified: Secondary | ICD-10-CM

## 2017-02-28 DIAGNOSIS — Z7901 Long term (current) use of anticoagulants: Secondary | ICD-10-CM | POA: Diagnosis not present

## 2017-03-09 ENCOUNTER — Other Ambulatory Visit: Payer: Self-pay | Admitting: Cardiovascular Disease

## 2017-03-18 ENCOUNTER — Encounter: Payer: Self-pay | Admitting: Gastroenterology

## 2017-04-18 LAB — PROTIME-INR: INR: 2.5 — AB (ref 0.9–1.1)

## 2017-04-19 ENCOUNTER — Ambulatory Visit (INDEPENDENT_AMBULATORY_CARE_PROVIDER_SITE_OTHER): Payer: Medicare Other | Admitting: Pharmacist Clinician (PhC)/ Clinical Pharmacy Specialist

## 2017-04-19 DIAGNOSIS — Z7901 Long term (current) use of anticoagulants: Secondary | ICD-10-CM | POA: Diagnosis not present

## 2017-04-19 DIAGNOSIS — I482 Chronic atrial fibrillation, unspecified: Secondary | ICD-10-CM

## 2017-05-15 ENCOUNTER — Telehealth: Payer: Self-pay

## 2017-05-15 ENCOUNTER — Ambulatory Visit (INDEPENDENT_AMBULATORY_CARE_PROVIDER_SITE_OTHER): Payer: Medicare Other | Admitting: Gastroenterology

## 2017-05-15 ENCOUNTER — Other Ambulatory Visit: Payer: Self-pay

## 2017-05-15 ENCOUNTER — Encounter: Payer: Self-pay | Admitting: Gastroenterology

## 2017-05-15 VITALS — BP 130/75 | HR 95 | Temp 97.8°F | Ht 70.0 in | Wt 217.0 lb

## 2017-05-15 DIAGNOSIS — Z1211 Encounter for screening for malignant neoplasm of colon: Secondary | ICD-10-CM | POA: Diagnosis not present

## 2017-05-15 DIAGNOSIS — K529 Noninfective gastroenteritis and colitis, unspecified: Secondary | ICD-10-CM | POA: Diagnosis not present

## 2017-05-15 MED ORDER — NA SULFATE-K SULFATE-MG SULF 17.5-3.13-1.6 GM/177ML PO SOLN: 1.0000 | ORAL | 0 refills | Status: DC

## 2017-05-15 NOTE — Patient Instructions (Addendum)
We are requesting to hold Coumadin for 3-5 days prior to the colonoscopy.  Do not take Glucovance the day of the procedure.  We have scheduled you for a colonoscopy in the near future with Dr. Oneida Alar!  I would keep a food diary to see if there are any triggers. Eliminating lactose may be helpful as well; I have added a handout about this.   It was a pleasure to see you today. I strive to create trusting relationships with patients to provide genuine, compassionate, and quality care. I value your feedback. If you receive a survey regarding your visit,  I greatly appreciate you taking time to fill this out.   Annitta Needs, PhD, ANP-BC [facility]   Lactose-Free Diet, Adult If you have lactose intolerance, you are not able to digest lactose. Lactose is a natural sugar found mainly in milk and milk products. You may need to avoid all foods and beverages that contain lactose. A lactose-free diet can help you do this. What do I need to know about this diet?  Do not consume foods, beverages, vitamins, minerals, or medicines with lactose. Read ingredients lists carefully.  Look for the words "lactose-free" on labels.  Use lactase enzyme drops or tablets as directed by your health care provider.  Use lactose-free milk or a milk alternative, such as soy milk, for drinking and cooking.  Make sure you get enough calcium and vitamin D in your diet. A lactose-free eating plan can be lacking in these important nutrients.  Take calcium and vitamin D supplements as directed by your health care provider. Talk to your provider about supplements if you are not able to get enough calcium and vitamin D from food. Which foods have lactose? Lactose is found in:  Milk and foods made from milk.  Yogurt.  Cheese.  Butter.  Margarine.  Sour cream.  Cream.  Whipped toppings and nondairy creamers.  Ice cream and other milk-based desserts.  Lactose is also found in foods or  products made with milk or milk ingredients. To find out whether a food contains milk or a milk ingredient, look at the ingredients list. Avoid foods with the statement "May contain milk" and foods that contain:  Butter.  Cream.  Milk.  Milk solids.  Milk powder.  Whey.  Curd.  Caseinate.  Lactose.  Lactalbumin.  Lactoglobulin.  What are some alternatives to milk and foods made with milk products?  Lactose-free milk.  Soy milk with added calcium and vitamin D.  Almond, coconut, or rice milk with added calcium and vitamin D. Note that these are low in protein.  Soy products, such as soy yogurt, soy cheese, soy ice cream, and soy-based sour cream. Which foods can I eat? Grains Breads and rolls made without milk, such as [place], [place], or [place] bread, bagels, pita, and [employer]. Corn tortillas, corn meal, grits, and polenta. Crackers without lactose or milk solids, such as soda crackers and graham crackers. Cooked or dry cereals without lactose or milk solids. Pasta, quinoa, couscous, barley, oats, bulgur, farro, rice, wild rice, or other grains prepared without milk or lactose. Plain popcorn. Vegetables Fresh, frozen, and canned vegetables without cheese, cream, or butter sauces. Fruits All fresh, canned, frozen, or dried fruits that are not processed with lactose. Meats and Other Protein Sources Plain beef, chicken, fish, [place], lamb, veal, pork, wild game, or ham. Kosher-prepared meat products. Strained or junior meats that do not contain milk. Eggs. Soy meat substitutes. Beans, lentils, and hummus. Tofu. Nuts  and seeds. Peanut or other nut butters without lactose. Soups, casseroles, and mixed dishes without cheese, cream, or milk. Dairy Lactose-free milk. Soy, rice, or almond milk with added calcium and vitamin D. Soy cheese and yogurt. Beverages Carbonated drinks. Tea. Coffee, freeze-dried coffee, and some instant coffees. Fruit and vegetable juices. Condiments Soy  sauce. Carob powder. Olives. Gravy made with water. Baker's cocoa. Angie Fava. Pure seasonings and spices. Ketchup. Mustard. Bouillon. Broth. Sweets and Desserts Water and fruit ices. Gelatin. Cookies, pies, or cakes made from allowed ingredients, such as angel food cake. Pudding made with water or a milk substitute. Lactose-free tofu desserts. Soy, coconut milk, or rice-milk-based frozen desserts. Sugar. Honey. Jam, jelly, and marmalade. Molasses. Pure sugar candy. Dark chocolate without milk. Marshmallows. Fats and Oils Margarines and salad dressings that do not contain milk. Berniece Salines. Vegetable oils. Shortening. Mayonnaise. Soy or coconut-based cream. The items listed above may not be a complete list of recommended foods or beverages. Contact your dietitian for more options. Which foods are not recommended? Grains Breads and rolls that contain milk. Toaster pastries. Muffins, biscuits, waffles, cornbread, and pancakes. These can be prepared at home, commercial, or from mixes. Sweet rolls, donuts, English muffins, fry bread, lefse, flour tortillas with lactose, or [place] toast made with milk or milk ingredients. Crackers that contain lactose. Corn curls. Cooked or dry cereals with lactose. Vegetables Creamed or breaded vegetables. Vegetables in a cheese or butter sauce or with lactose-containing margarines. Instant potatoes. [place] fries. Scalloped or au gratin potatoes. Fruits None. Meats and Other Protein Sources Scrambled eggs, omelets, and souffles that contain milk. Creamed or breaded meat, fish, chicken, or [place]. Sausage products, such as wieners and liver sausage. Cold cuts that contain milk solids. Cheese, cottage cheese, ricotta cheese, and cheese spreads. Lasagna and macaroni and cheese. Pizza. Peanut or other nut butters with added milk solids. Casseroles or mixed dishes containing milk or cheese. Dairy All dairy products, including milk, goat's milk, buttermilk, kefir, acidophilus milk,  flavored milk, evaporated milk, condensed milk, dulce de [place], eggnog, yogurt, cheese, and cheese spreads. Beverages Hot chocolate. Cocoa with lactose. Instant iced teas. Powdered fruit drinks. Smoothies made with milk or yogurt. Condiments Chewing gum that has lactose. Cocoa that has lactose. Spice blends if they contain milk products. Artificial sweeteners that contain lactose. Nondairy creamers. Sweets and Desserts Ice cream, ice milk, gelato, sherbet, and frozen yogurt. Custard, pudding, and mousse. Cake, cream pies, cookies, and other desserts containing milk, cream, cream cheese, or milk chocolate. Pie crust made with milk-containing margarine or butter. Reduced-calorie desserts made with a sugar substitute that contains lactose. Toffee and butterscotch. Milk, white, or dark chocolate that contains milk. Fudge. Caramel. Fats and Oils Margarines and salad dressings that contain milk or cheese. Cream. Half and half. Cream cheese. Sour cream. Chip dips made with sour cream or yogurt. The items listed above may not be a complete list of foods and beverages to avoid. Contact your dietitian for more information. Am I getting enough calcium? Calcium is found in many foods that contain lactose and is important for bone health. The amount of calcium you need depends on your age:  Adults younger than 50 years: 1000 mg of calcium a day.  Adults older than 50 years: 1200 mg of calcium a day.  If you are not getting enough calcium, other calcium sources include:  Orange juice with calcium added. There are 300-350 mg of calcium in 1 cup of orange juice.  Sardines with edible bones. There are 325  mg of calcium in 3 oz of sardines.  Calcium-fortified soy milk. There are 300-400 mg of calcium in 1 cup of calcium-fortified soy milk.  Calcium-fortified rice or almond milk. There are 300 mg of calcium in 1 cup of calcium-fortified rice or almond milk.  Canned salmon with edible bones. There are 180 mg  of calcium in 3 oz of canned salmon with edible bones.  Calcium-fortified breakfast cereals. There are [phone] mg of calcium in calcium-fortified breakfast cereals.  Tofu set with calcium sulfate. There are 250 mg of calcium in  cup of tofu set with calcium sulfate.  Spinach, cooked. There are 145 mg of calcium in  cup of cooked spinach.  Edamame, cooked. There are 130 mg of calcium in  cup of cooked edamame.  Collard greens, cooked. There are 125 mg of calcium in  cup of cooked collard greens.  Kale, frozen or cooked. There are 90 mg of calcium in  cup of cooked or frozen kale.  Almonds. There are 95 mg of calcium in  cup of almonds.  Broccoli, cooked. There are 60 mg of calcium in 1 cup of cooked broccoli.  This information is not intended to replace advice given to you by your health care provider. Make sure you discuss any questions you have with your health care provider. Document Released: 06/23/2001 Document Revised: 06/09/2015 Document Reviewed: 04/03/2013 Elsevier Interactive Patient Education  2018 [employer].

## 2017-05-15 NOTE — Progress Notes (Addendum)
REVIEWED-NO ADDITIONAL RECOMMENDATIONS.  Primary Care Physician:  Andres Shad, MD Primary Gastroenterologist:  Dr. Oneida Alar   Chief Complaint  Patient presents with  . Diarrhea    2-3 per day  . Consult    colonoscopy    HPI:   William Preston is a 67 y.o. male presenting today at the request of Dr. Teryl Lucy secondary to need for screening colonoscopy. He notes a chronic history, dating back to at least 2016, of frequent bowel movements. No prior colonoscopy.   Notes soft stools, postprandially. Denies watery stool.  No straining. No rectal bleeding. No abdominal pain. No new medications. Has been on Glucovance for 3-4 years. He feels more frequent stools started prior to glucovance.   Past Medical History:  Diagnosis Date  . Chronic atrial fibrillation ([place])   . DM (diabetes mellitus) ([place])   . Hyperlipidemia   . Hypertension   . OSA on CPAP     Past Surgical History:  Procedure Laterality Date  . CARDIAC CATHETERIZATION  11 2005   normal  . CARDIOVERSION  05/25/2008   successful  . NM MYOCAR PERF WALL MOTION  10/29/2007   low risk  . ruptured spleen     age 40, fell off a tractor that pushed him across a field  . 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 UMBILICAL HERNIA REPAIR    . US ECHOCARDIOGRAPHY  11/19/2008   LA mod-severely dilated, RA mildly dilated,mild MR,mild to mod TR,trace AI    Current Outpatient Medications  Medication Sig Dispense Refill  . digoxin (DIGOX) 0.25 MG tablet Take 0.5 tablets (125 mcg total) by mouth daily. 45 tablet 3  . diltiazem (CARTIA XT) 240 MG 24 hr capsule TAKE 1 BY MOUTH DAILY 90 capsule 2  . doxazosin (CARDURA) 2 MG tablet Take 2 mg by mouth daily.    . finasteride (PROSCAR) 5 MG tablet Take 5 mg by mouth daily.  0  . furosemide (LASIX) 40 MG tablet Take 0.5 tablets (20 mg total) by mouth daily as needed for edema. 45 tablet 2  . glyBURIDE-metformin (GLUCOVANCE) 2.5-500 MG per tablet Take 1 tablet by mouth 2 (two) times daily with  a meal.    . losartan (COZAAR) 50 MG tablet Take 1 tablet (50 mg total) by mouth daily. 90 tablet 2  . metoprolol tartrate (LOPRESSOR) 25 MG tablet TAKE 1/2 BY MOUTH 2 TIMES DAILY GENERIC FOR LOPRESSOR 90 tablet 2  . ONE TOUCH ULTRA TEST test strip     . pravastatin (PRAVACHOL) 80 MG tablet Take 1 tablet (80 mg total) by mouth daily. 90 tablet 3  . warfarin (COUMADIN) 5 MG tablet TAKE 1-1 & 1/2 TABLETS DAILY AS DIRECTED BY COUMADIN CLINIC 110 tablet 0  . Na Sulfate-K Sulfate-Mg Sulf (SUPREP BOWEL PREP KIT) 17.5-3.13-1.6 GM/177ML SOLN Take 1 kit by mouth as directed. 1 Bottle 0   No current facility-administered medications for this visit.     Allergies as of 05/15/2017  . (No Known Allergies)    Family History  Problem Relation Age of Onset  . Heart failure Mother   . Heart failure Father   . Hyperlipidemia Brother   . Hyperlipidemia Sister   . Colon cancer Neg Hx   . Colon polyps Neg Hx     Social History   Socioeconomic History  . Marital status: Married    Spouse name: Not on file  . Number of children: Not on file  . Years of education: Not on file  .  Highest education level: Not on file  Occupational History  . Occupation: retired    Comment: [employer] in [place]  . Financial resource strain: Not on file  . Food insecurity:    Worry: Not on file    Inability: Not on file  . Transportation needs:    Medical: Not on file    Non-medical: Not on file  Tobacco Use  . Smoking status: Never Smoker  . Smokeless tobacco: Never Used  Substance and Sexual Activity  . Alcohol use: No  . Drug use: No  . Sexual activity: Not on file  Lifestyle  . Physical activity:    Days per week: Not on file    Minutes per session: Not on file  . Stress: Not on file  Relationships  . Social connections:    Talks on phone: Not on file    Gets together: Not on file    Attends religious service: Not on file    Active member of club or organization: Not on file     Attends meetings of clubs or organizations: Not on file    Relationship status: Not on file  . Intimate partner violence:    Fear of current or ex partner: Not on file    Emotionally abused: Not on file    Physically abused: Not on file    Forced sexual activity: Not on file  Other Topics Concern  . Not on file  Social History Narrative  . Not on file    Review of Systems: Gen: Denies any fever, chills, fatigue, weight loss, lack of appetite.  CV: Denies chest pain, heart palpitations, peripheral edema, syncope.  Resp: Denies shortness of breath at rest or with exertion. Denies wheezing or cough.  GI: see HPI  GU : Denies urinary burning, urinary frequency, urinary hesitancy MS: Denies joint pain, muscle weakness, cramps, or limitation of movement.  Derm: Denies rash, itching, dry skin Psych: Denies depression, anxiety, memory loss, and confusion Heme: Denies bruising, bleeding, and enlarged lymph nodes.  Physical Exam: BP 130/75   Pulse 95   Temp 97.8 F (36.6 C) (Oral)   Ht _0  (1.778 m)   Wt 217 lb (98.4 kg)   BMI 31.14 kg/m  General:   Alert and oriented. Pleasant and cooperative. Well-nourished and well-developed.  Head:  Normocephalic and atraumatic. Eyes:  Without icterus, sclera clear and conjunctiva pink.  Ears:  Normal auditory acuity. Nose:  No deformity, discharge,  or lesions. Mouth:  No deformity or lesions, oral mucosa pink.  Lungs:  Clear to auscultation bilaterally. No wheezes, rales, or rhonchi. No distress.  Heart:  S1, S2 present, irregular, systolic murmur  Abdomen:  +BS, soft, non-tender and non-distended. No HSM noted. No guarding or rebound. No masses appreciated.  Rectal:  Deferred  Msk:  Symmetrical without gross deformities. Normal posture. Extremities:  Without edema. Neurologic:  Alert and  oriented x4;  grossly normal neurologically. Psych:  Alert and cooperative. Normal mood and affect.

## 2017-05-15 NOTE — Telephone Encounter (Signed)
Dr. Gwenlyn Found, Pt is scheduled for a Colonoscopy with Dr. Oneida Alar. Is it ok for pt to hold Coumadin 3-5 days prior to colonoscopy? Please advise

## 2017-05-15 NOTE — Telephone Encounter (Signed)
Okay to hold Coumadin prior to colonoscopy.

## 2017-05-16 ENCOUNTER — Telehealth: Payer: Self-pay | Admitting: Gastroenterology

## 2017-05-16 NOTE — Assessment & Plan Note (Addendum)
67 year old male with need for initial screening colonoscopy, without any family history of colorectal cancer or polyps. He has noted frequent soft stools for at least 3 years but without any diarrhea or rectal bleeding. Query diet or medication related (?glucovance). No alarm features. Keep stool diary, trial of lactose-free.  Proceed with colonoscopy with Dr. Oneida Alar in the near future. The risks, benefits, and alternatives have been discussed in detail with the patient. They state understanding and desire to proceed.  History of afib on Coumadin. HOLD COUMADIN X 4 days prior. Discussed with cardiology.

## 2017-05-16 NOTE — Telephone Encounter (Signed)
OK per cardiology to hold coumadin prior. Please have patient hold coumadin 4 days prior. Let's also make sure he has an appt to get back with Coumadin clinic in a timely manner after procedure.

## 2017-05-17 NOTE — Progress Notes (Signed)
cc'ed to pcp °

## 2017-05-17 NOTE — Telephone Encounter (Signed)
LMOVM. Patient will start holding coumadin starting 06/16/17

## 2017-05-17 NOTE — Telephone Encounter (Signed)
Called spoke with spouse and is aware of below. She verbalized understanding

## 2017-06-07 ENCOUNTER — Ambulatory Visit (INDEPENDENT_AMBULATORY_CARE_PROVIDER_SITE_OTHER): Payer: Medicare Other | Admitting: Pharmacist Clinician (PhC)/ Clinical Pharmacy Specialist

## 2017-06-07 DIAGNOSIS — I482 Chronic atrial fibrillation, unspecified: Secondary | ICD-10-CM

## 2017-06-07 DIAGNOSIS — Z7901 Long term (current) use of anticoagulants: Secondary | ICD-10-CM | POA: Diagnosis not present

## 2017-06-07 LAB — PROTIME-INR: INR: 2.1 — AB (ref 0.9–1.1)

## 2017-06-17 ENCOUNTER — Telehealth: Payer: Self-pay | Admitting: *Deleted

## 2017-06-17 NOTE — Telephone Encounter (Signed)
Spoke with patient spouse and made aware procedure time for 06/20/17 has changed to 2:00pm, will need to register at 1:00pm. Day of procedure he will drink 2nd prep at 9:00am, nothing after 11:00am that day either. Nothing further needed

## 2017-06-20 ENCOUNTER — Other Ambulatory Visit: Payer: Self-pay

## 2017-06-20 ENCOUNTER — Ambulatory Visit (HOSPITAL_COMMUNITY)
Admission: RE | Admit: 2017-06-20 | Discharge: 2017-06-20 | Disposition: A | Discharge status: Home / Self Care | Payer: Medicare Other | Source: Ambulatory Visit | Attending: Gastroenterology | Admitting: Gastroenterology

## 2017-06-20 ENCOUNTER — Encounter (HOSPITAL_COMMUNITY)
Admission: RE | Disposition: A | Discharge status: Home / Self Care | Payer: Self-pay | Source: Ambulatory Visit | Attending: Gastroenterology

## 2017-06-20 ENCOUNTER — Encounter (HOSPITAL_COMMUNITY): Payer: Self-pay | Admitting: *Deleted

## 2017-06-20 DIAGNOSIS — Z8371 Family history of colonic polyps: Secondary | ICD-10-CM | POA: Diagnosis not present

## 2017-06-20 DIAGNOSIS — E119 Type 2 diabetes mellitus without complications: Secondary | ICD-10-CM | POA: Insufficient documentation

## 2017-06-20 DIAGNOSIS — K648 Other hemorrhoids: Secondary | ICD-10-CM | POA: Diagnosis not present

## 2017-06-20 DIAGNOSIS — D123 Benign neoplasm of transverse colon: Secondary | ICD-10-CM | POA: Diagnosis not present

## 2017-06-20 DIAGNOSIS — E785 Hyperlipidemia, unspecified: Secondary | ICD-10-CM | POA: Diagnosis not present

## 2017-06-20 DIAGNOSIS — D122 Benign neoplasm of ascending colon: Secondary | ICD-10-CM | POA: Insufficient documentation

## 2017-06-20 DIAGNOSIS — Z79899 Other long term (current) drug therapy: Secondary | ICD-10-CM | POA: Diagnosis not present

## 2017-06-20 DIAGNOSIS — K5730 Diverticulosis of large intestine without perforation or abscess without bleeding: Secondary | ICD-10-CM | POA: Diagnosis not present

## 2017-06-20 DIAGNOSIS — G4733 Obstructive sleep apnea (adult) (pediatric): Secondary | ICD-10-CM | POA: Diagnosis not present

## 2017-06-20 DIAGNOSIS — D124 Benign neoplasm of descending colon: Secondary | ICD-10-CM | POA: Insufficient documentation

## 2017-06-20 DIAGNOSIS — D125 Benign neoplasm of sigmoid colon: Secondary | ICD-10-CM | POA: Insufficient documentation

## 2017-06-20 DIAGNOSIS — K644 Residual hemorrhoidal skin tags: Secondary | ICD-10-CM | POA: Insufficient documentation

## 2017-06-20 DIAGNOSIS — Z1211 Encounter for screening for malignant neoplasm of colon: Secondary | ICD-10-CM | POA: Diagnosis not present

## 2017-06-20 DIAGNOSIS — Z7901 Long term (current) use of anticoagulants: Secondary | ICD-10-CM | POA: Insufficient documentation

## 2017-06-20 DIAGNOSIS — I10 Essential (primary) hypertension: Secondary | ICD-10-CM | POA: Insufficient documentation

## 2017-06-20 DIAGNOSIS — I482 Chronic atrial fibrillation: Secondary | ICD-10-CM | POA: Diagnosis not present

## 2017-06-20 DIAGNOSIS — Z800 Family history of malignant neoplasm of digestive organs: Secondary | ICD-10-CM | POA: Insufficient documentation

## 2017-06-20 HISTORY — PX: POLYPECTOMY: SHX5525

## 2017-06-20 HISTORY — PX: COLONOSCOPY: SHX5424

## 2017-06-20 LAB — GLUCOSE, CAPILLARY: GLUCOSE-CAPILLARY: 101 mg/dL — AB (ref 65–99)

## 2017-06-20 SURGERY — COLONOSCOPY
Anesthesia: Moderate Sedation

## 2017-06-20 MED ORDER — MIDAZOLAM HCL 5 MG/5ML IJ SOLN
INTRAMUSCULAR | Status: DC | PRN
  Administered 2017-06-20 (×2): 2 mg via INTRAVENOUS

## 2017-06-20 MED ORDER — MEPERIDINE HCL 100 MG/ML IJ SOLN
INTRAMUSCULAR | Status: AC
  Filled 2017-06-20: qty 2

## 2017-06-20 MED ORDER — MIDAZOLAM HCL 5 MG/5ML IJ SOLN
INTRAMUSCULAR | Status: AC
  Filled 2017-06-20: qty 10

## 2017-06-20 MED ORDER — MEPERIDINE HCL 100 MG/ML IJ SOLN
INTRAMUSCULAR | Status: DC | PRN
  Administered 2017-06-20 (×2): 25 mg via INTRAVENOUS

## 2017-06-20 MED ORDER — STERILE WATER FOR IRRIGATION IR SOLN
Status: DC | PRN
  Administered 2017-06-20: 2.5 mL

## 2017-06-20 MED ORDER — ENOXAPARIN SODIUM 100 MG/ML ~~LOC~~ SOLN: 1.0000 mg/kg | Freq: Two times a day (BID) | SUBCUTANEOUS | 0 refills | Status: DC

## 2017-06-20 MED ORDER — SODIUM CHLORIDE 0.9 % IV SOLN
INTRAVENOUS | Status: DC
  Administered 2017-06-20: 1000 mL via INTRAVENOUS

## 2017-06-20 NOTE — Op Note (Signed)
[facility] Patient Name: William Preston Procedure Date: 06/20/2017 1:58 PM MRN: [id number] Date of Birth: 23-Mar-1950 Attending MD: Barney Drain MD, MD CSN: [id number] Age: 67 Admit Type: Outpatient Procedure:                Colonoscopy WITH COLD FORCEPS/SNARE & SNARE CAUTERY                            POLYPECTOMY Indications:              Screening for colorectal malignant neoplasm Providers:                Barney Drain MD, MD, Lurline Del, RN, Randa Spike, Technician Referring MD:             Georgeanna Lea. Winfield MD, MD Medicines:                Meperidine 50 mg IV, Midazolam 4 mg IV Complications:            No immediate complications. Estimated Blood Loss:     Estimated blood loss was minimal. Procedure:                Pre-Anesthesia Assessment:                           - Prior to the procedure, a History and Physical                            was performed, and patient medications and                            allergies were reviewed. The patient's tolerance of                            previous anesthesia was also reviewed. The risks                            and benefits of the procedure and the sedation                            options and risks were discussed with the patient.                            All questions were answered, and informed consent                            was obtained. Prior Anticoagulants: The patient has                            taken Coumadin (warfarin), last dose was 5 days                            prior to procedure. ASA Grade Assessment: II - A  patient with mild systemic disease. After reviewing                            the risks and benefits, the patient was deemed in                            satisfactory condition to undergo the procedure.                            After obtaining informed consent, the colonoscope                            was passed under direct vision.  Throughout the                            procedure, the patient's blood pressure, pulse, and                            oxygen saturations were monitored continuously. The                            EC-[id number] ([id number]) scope was introduced through                            the anus and advanced to the the cecum, identified                            by appendiceal orifice and ileocecal valve. The                            colonoscopy was somewhat difficult due to a                            tortuous colon. Successful completion of the                            procedure was aided by straightening and shortening                            the scope to obtain bowel loop reduction and                            COLOWRAP. The patient tolerated the procedure well.                            The quality of the bowel preparation was excellent.                            The ileocecal valve, appendiceal orifice, and                            rectum were photographed. Scope In: 2:35:46 PM Scope Out: 2:59:07 PM Scope Withdrawal Time: 0 hours 21 minutes 24 seconds  Total Procedure Duration: 0 hours 23 minutes 21 seconds  Findings:      Two sessile polyps were found in the ascending colon. The polyps were 5       to 7 mm in size. These polyps were removed with a hot snare. Resection       and retrieval were complete.      A 4 mm polyp was found in the ascending colon. The polyp was sessile.       The polyp was removed with a cold snare. Resection and retrieval were       complete.      Three sessile polyps were found in the sigmoid colon, descending colon       and transverse colon. The polyps were 2 to 3 mm in size. These polyps       were removed with a cold snare. Resection and retrieval were complete.      Multiple small and large-mouthed diverticula were found in the       recto-sigmoid colon, sigmoid colon, descending colon and ascending colon.      External and internal hemorrhoids  were found. The hemorrhoids were       moderate and small(IH).      The recto-sigmoid colon was mildly redundant. Impression:               - Two 5 to 7 mm polyps in the ascending colon,                            removed with a hot snare. Resected and retrieved.                           - One 4 mm polyp in the ascending colon, removed                            with a cold snare. Resected and retrieved.                           - Three 2 to 3 mm polyps in the sigmoid colon, in                            the descending colon and in the transverse colon,                            removed with a cold snare. Resected and retrieved.                           - Diverticulosis in the recto-sigmoid colon, in the                            sigmoid colon, in the descending colon and in the                            ascending colon.                           - External and internal hemorrhoids.                           -  Redundant LEFT colon. Moderate Sedation:      Moderate (conscious) sedation was administered by the endoscopy nurse       and supervised by the endoscopist. The following parameters were       monitored: oxygen saturation, heart rate, blood pressure, and response       to care. Total physician intraservice time was 21 minutes. Recommendation:           - High fiber diet.                           - Continue present medications. RE-START COUMADIN                            JUN 7. LOVENOX BID FOR 3 DAYS STARTING JUN 7.                           - Await pathology results.                           - Repeat colonoscopy in 3 years for surveillance.                           - Patient has a contact number available for                            emergencies. The signs and symptoms of potential                            delayed complications were discussed with the                            patient. Return to normal activities tomorrow.                            Written discharge  instructions were provided to the                            patient. Procedure Code(s):        --- Professional ---                           [phone], Colonoscopy, flexible; with removal of                            tumor(s), polyp(s), or other lesion(s) by snare                            technique                           G0500, Moderate sedation services provided by the                            same physician or other qualified health care  professional performing a gastrointestinal                            endoscopic service that sedation supports,                            requiring the presence of an independent trained                            observer to assist in the monitoring of the                            patient's level of consciousness and physiological                            status; initial 15 minutes of intra-service time;                            patient age 41 years or older (additional time may                            be reported with [phone], as appropriate) Diagnosis Code(s):        --- Professional ---                           Z12.11, Encounter for screening for malignant                            neoplasm of colon                           D12.2, Benign neoplasm of ascending colon                           D12.5, Benign neoplasm of sigmoid colon                           D12.4, Benign neoplasm of descending colon                           D12.3, Benign neoplasm of transverse colon (hepatic                            flexure or splenic flexure)                           K64.8, Other hemorrhoids                           K57.30, Diverticulosis of large intestine without                            perforation or abscess without bleeding                           Q43.8, Other specified congenital malformations of  intestine CPT copyright 2017 [employer]. All rights reserved. The  codes documented in this report are preliminary and upon coder review may  be revised to meet current compliance requirements. Barney Drain, MD Barney Drain MD, MD 06/20/2017 3:16:20 PM This report has been signed electronically. Number of Addenda: 0

## 2017-06-20 NOTE — Discharge Instructions (Signed)
You have small internal hemorrhoids and diverticulosis IN YOUR LEFT AND RIGHT COLON. YOU HAD SIX POLYPS REMOVED.     RE-START COUMADIN JUN 7. START LOVENOX TWICE DAILY FOR 3 DAYS STARTING ON JUN 7.  DRINK WATER TO KEEP YOUR URINE LIGHT YELLOW.  FOLLOW A HIGH FIBER DIET. AVOID ITEMS THAT CAUSE BLOATING. See info below.  YOUR BIOPSY RESULTS WILL BE AVAILABLE IN 7 DAYS.   USE PREPARATION H FOUR TIMES  A DAY IF NEEDED TO RELIEVE RECTAL PAIN/PRESSURE/BLEEDING.  Next colonoscopy in 3 years.  Colonoscopy Care After Read the instructions outlined below and refer to this sheet in the next week. These discharge instructions provide you with general information on caring for yourself after you leave the hospital. While your treatment has been planned according to the most current medical practices available, unavoidable complications occasionally occur. If you have any problems or questions after discharge, call DR. Laryssa Hassing, [phone].  ACTIVITY  You may resume your regular activity, but move at a slower pace for the next 24 hours.   Take frequent rest periods for the next 24 hours.   Walking will help get rid of the air and reduce the bloated feeling in your belly (abdomen).   No driving for 24 hours (because of the medicine (anesthesia) used during the test).   You may shower.   Do not sign any important legal documents or operate any machinery for 24 hours (because of the anesthesia used during the test).    NUTRITION  Drink plenty of fluids.   You may resume your normal diet as instructed by your doctor.   Begin with a light meal and progress to your normal diet. Heavy or fried foods are harder to digest and may make you feel sick to your stomach (nauseated).   Avoid alcoholic beverages for 24 hours or as instructed.    MEDICATIONS  You may resume your normal medications.   WHAT YOU CAN EXPECT TODAY  Some feelings of bloating in the abdomen.   Passage of more gas  than usual.   Spotting of blood in your stool or on the toilet paper  .  IF YOU HAD POLYPS REMOVED DURING THE COLONOSCOPY:  Eat a soft diet IF YOU HAVE NAUSEA, BLOATING, ABDOMINAL PAIN, OR VOMITING.    FINDING OUT THE RESULTS OF YOUR TEST Not all test results are available during your visit. DR. Oneida Alar WILL CALL YOU WITHIN 14 DAYS OF YOUR PROCEDUE WITH YOUR RESULTS. Do not assume everything is normal if you have not heard from DR. Yemariam Magar, CALL HER OFFICE AT [phone].  SEEK IMMEDIATE MEDICAL ATTENTION AND CALL THE OFFICE: [phone] IF:  You have more than a spotting of blood in your stool.   Your belly is swollen (abdominal distention).   You are nauseated or vomiting.   You have a temperature over 101F.   You have abdominal pain or discomfort that is severe or gets worse throughout the day.  High-Fiber Diet A high-fiber diet changes your normal diet to include more whole grains, legumes, fruits, and vegetables. Changes in the diet involve replacing refined carbohydrates with unrefined foods. The calorie level of the diet is essentially unchanged. The Dietary Reference Intake (recommended amount) for adult males is 38 grams per day. For adult females, it is 25 grams per day. Pregnant and lactating women should consume 28 grams of fiber per day. Fiber is the intact part of a plant that is not broken down during digestion. Functional fiber is fiber that has  been isolated from the plant to provide a beneficial effect in the body. PURPOSE  Increase stool bulk.   Ease and regulate bowel movements.   Lower cholesterol.   REDUCE RISK OF COLON CANCER  INDICATIONS THAT YOU NEED MORE FIBER  Constipation and hemorrhoids.   Uncomplicated diverticulosis (intestine condition) and irritable bowel syndrome.   Weight management.   As a protective measure against hardening of the arteries (atherosclerosis), diabetes, and cancer.   GUIDELINES FOR INCREASING FIBER IN THE  DIET  Start adding fiber to the diet slowly. A gradual increase of about 5 more grams (2 slices of whole-wheat bread, 2 servings of most fruits or vegetables, or 1 bowl of high-fiber cereal) per day is best. Too rapid an increase in fiber may result in constipation, flatulence, and bloating.   Drink enough water and fluids to keep your urine clear or pale yellow. Water, juice, or caffeine-free drinks are recommended. Not drinking enough fluid may cause constipation.   Eat a variety of high-fiber foods rather than one type of fiber.   Try to increase your intake of fiber through using high-fiber foods rather than fiber pills or supplements that contain small amounts of fiber.   The goal is to change the types of food eaten. Do not supplement your present diet with high-fiber foods, but replace foods in your present diet.   INCLUDE A VARIETY OF FIBER SOURCES  Replace refined and processed grains with whole grains, canned fruits with fresh fruits, and incorporate other fiber sources. White rice, white breads, and most bakery goods contain little or no fiber.   Brown whole-grain rice, buckwheat oats, and many fruits and vegetables are all good sources of fiber. These include: broccoli, Brussels sprouts, cabbage, cauliflower, beets, sweet potatoes, white potatoes (skin on), carrots, tomatoes, eggplant, squash, berries, fresh fruits, and dried fruits.   Cereals appear to be the richest source of fiber. Cereal fiber is found in whole grains and bran. Bran is the fiber-rich outer coat of cereal grain, which is largely removed in refining. In whole-grain cereals, the bran remains. In breakfast cereals, the largest amount of fiber is found in those with "bran" in their names. The fiber content is sometimes indicated on the label.   You may need to include additional fruits and vegetables each day.   In baking, for 1 cup white flour, you may use the following substitutions:   1 cup whole-wheat flour  minus 2 tablespoons.   1/2 cup white flour plus 1/2 cup whole-wheat flour.   Polyps, Colon  A polyp is extra tissue that grows inside your body. Colon polyps grow in the large intestine. The large intestine, also called the colon, is part of your digestive system. It is a long, hollow tube at the end of your digestive tract where your body makes and stores stool. Most polyps are not dangerous. They are benign. This means they are not cancerous. But over time, some types of polyps can turn into cancer. Polyps that are smaller than a pea are usually not harmful. But larger polyps could someday become or may already be cancerous. To be safe, doctors remove all polyps and test them.   PREVENTION There is not one sure way to prevent polyps. You might be able to lower your risk of getting them if you:  Eat more fruits and vegetables and less fatty food.   Do not smoke.   Avoid alcohol.   Exercise every day.   Lose weight if you are overweight.  Eating more calcium and folate can also lower your risk of getting polyps. Some foods that are rich in calcium are milk, cheese, and broccoli. Some foods that are rich in folate are chickpeas, kidney beans, and spinach.    Diverticulosis Diverticulosis is a common condition that develops when small pouches (diverticula) form in the wall of the colon. The risk of diverticulosis increases with age. It happens more often in people who eat a low-fiber diet. Most individuals with diverticulosis have no symptoms. Those individuals with symptoms usually experience belly (abdominal) pain, constipation, or loose stools (diarrhea).  HOME CARE INSTRUCTIONS  Increase the amount of fiber in your diet as directed by your caregiver or dietician. This may reduce symptoms of diverticulosis.   Drink at least 6 to 8 glasses of water each day to prevent constipation.   Try not to strain when you have a bowel movement.   Avoiding nuts and seeds to prevent  complications is NOT NECESSARY.   FOODS HAVING HIGH FIBER CONTENT INCLUDE:  Fruits. Apple, peach, pear, tangerine, raisins, prunes.   Vegetables. Brussels sprouts, asparagus, broccoli, cabbage, carrot, cauliflower, romaine lettuce, spinach, summer squash, tomato, winter squash, zucchini.   Starchy Vegetables. Baked beans, kidney beans, lima beans, split peas, lentils, potatoes (with skin).   Grains. Whole wheat bread, brown rice, bran flake cereal, plain oatmeal, white rice, shredded wheat, bran muffins.   SEEK IMMEDIATE MEDICAL CARE IF:  You develop increasing pain or severe bloating.   You have an oral temperature above 101F.   You develop vomiting or bowel movements that are bloody or black.   Hemorrhoids Hemorrhoids are dilated (enlarged) veins around the rectum. Sometimes clots will form in the veins. This makes them swollen and painful. These are called thrombosed hemorrhoids. Causes of hemorrhoids include:  Constipation.   Straining to have a bowel movement.   HEAVY LIFTING   HOME CARE INSTRUCTIONS  Eat a well balanced diet and drink 6 to 8 glasses of water every day to avoid constipation. You may also use a bulk laxative.   Avoid straining to have bowel movements.   Keep anal area dry and clean.   Do not use a donut shaped pillow or sit on the toilet for long periods. This increases blood pooling and pain.   Move your bowels when your body has the urge; this will require less straining and will decrease pain and pressure.

## 2017-06-20 NOTE — H&P (Addendum)
Primary Care Physician:  Andres Shad, MD Primary Gastroenterologist:  Dr. Oneida Alar  Pre-Procedure History & Physical: HPI:  William Preston is a 67 y.o. male here for [place].  Past Medical History:  Diagnosis Date  . Chronic atrial fibrillation ([place])   . DM (diabetes mellitus) ([place])   . Hyperlipidemia   . Hypertension   . OSA on CPAP     Past Surgical History:  Procedure Laterality Date  . CARDIAC CATHETERIZATION  11 2005   normal  . CARDIOVERSION  05/25/2008   successful  . NM MYOCAR PERF WALL MOTION  10/29/2007   low risk  . ruptured spleen     age 85, fell off a tractor that pushed him across a field  . 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 UMBILICAL HERNIA REPAIR    . US ECHOCARDIOGRAPHY  11/19/2008   LA mod-severely dilated, RA mildly dilated,mild MR,mild to mod TR,trace AI    Prior to Admission medications   Medication Sig Start Date End Date Taking? Authorizing Provider  Ascorbic Acid (VITAMIN C) 1000 MG tablet Take 1,000 mg by mouth daily.   Yes [provider]  cholecalciferol (VITAMIN D) 1000 units tablet Take 1,000 Units by mouth daily.   Yes [provider]  digoxin (DIGOX) 0.25 MG tablet Take 0.5 tablets (125 mcg total) by mouth daily. 07/03/16  Yes Lorretta Harp, MD  diltiazem (CARTIA XT) 240 MG 24 hr capsule TAKE 1 BY MOUTH DAILY 07/03/16  Yes Lorretta Harp, MD  doxazosin (CARDURA) 2 MG tablet Take 2 mg by mouth daily.   Yes [provider]  finasteride (PROSCAR) 5 MG tablet Take 5 mg by mouth daily. 04/05/16  Yes [provider]  furosemide (LASIX) 40 MG tablet Take 0.5 tablets (20 mg total) by mouth daily as needed for edema. 07/16/16  Yes Lorretta Harp, MD  glyBURIDE-metformin (GLUCOVANCE) 2.5-500 MG per tablet Take 1 tablet by mouth 3 (three) times daily with meals.    Yes [provider]  losartan (COZAAR) 50 MG tablet Take 1 tablet (50 mg total) by mouth daily. 07/03/16  Yes Lorretta Harp, MD  metoprolol tartrate (LOPRESSOR) 25 MG tablet TAKE 1/2 BY MOUTH 2 TIMES DAILY GENERIC FOR LOPRESSOR Patient taking differently: Take 25 mg by mouth daily.  07/03/16  Yes Lorretta Harp, MD  Multiple Vitamins-Minerals (MULTIVITAMIN WITH MINERALS) tablet Take 1 tablet by mouth daily.   Yes [provider]  pravastatin (PRAVACHOL) 80 MG tablet Take 1 tablet (80 mg total) by mouth daily. 07/03/16  Yes Lorretta Harp, MD  vitamin B-12 (CYANOCOBALAMIN) 1000 MCG tablet Take 1,000 mcg by mouth daily.   Yes [provider]  vitamin E 400 UNIT capsule Take 400 Units by mouth daily.   Yes [provider]  warfarin (COUMADIN) 5 MG tablet Take 5-7.5 mg by mouth See admin instructions. Pt takes 5 mg Mon, Wed Fri and 7.5 mg all other days   Yes [provider]    Allergies as of 05/15/2017  . (No Known Allergies)    Family History  Problem Relation Age of Onset  . Heart failure Mother   . Heart failure Father   . Hyperlipidemia Brother   . Hyperlipidemia Sister   . Colon cancer Neg Hx   . Colon polyps Neg Hx     Social History   Socioeconomic History  . Marital status: Married    Spouse name: Not on file  . Number  of children: Not on file  . Years of education: Not on file  . Highest education level: Not on file  Occupational History  . Occupation: retired    Comment: [employer] in [place]  . Financial resource strain: Not on file  . Food insecurity:    Worry: Not on file    Inability: Not on file  . Transportation needs:    Medical: Not on file    Non-medical: Not on file  Tobacco Use  . Smoking status: Never Smoker  . Smokeless tobacco: Never Used  Substance and Sexual Activity  . Alcohol use: No  . Drug use: No  . Sexual activity: Not on file  Lifestyle  . Physical activity:    Days per week: Not on file    Minutes per session: Not on file  . Stress: Not on file  Relationships  . Social connections:    Talks on  phone: Not on file    Gets together: Not on file    Attends religious service: Not on file    Active member of club or organization: Not on file    Attends meetings of clubs or organizations: Not on file    Relationship status: Not on file  . Intimate partner violence:    Fear of current or ex partner: Not on file    Emotionally abused: Not on file    Physically abused: Not on file    Forced sexual activity: Not on file  Other Topics Concern  . Not on file  Social History Narrative  . Not on file    Review of Systems: See HPI, otherwise negative ROS   Physical Exam: Pulse 81   Temp 98.2 F (36.8 C) (Oral)   Resp 12   Ht 5\' 10"  (1.778 m)   Wt 203 lb (92.1 kg)   SpO2 96%   BMI 29.13 kg/m  General:   Alert,  pleasant and cooperative in NAD Head:  Normocephalic and atraumatic. Neck:  Supple; Lungs:  Clear throughout to auscultation.    Heart:  Regular rate and IRREGULAR rhythm. Abdomen:  Soft, nontender and nondistended. Normal bowel sounds, without guarding, and without rebound.   Neurologic:  Alert and  oriented x4;  grossly normal neurologically.  Impression/Plan:    SCREENING  Plan:  1. TCS TODAY DISCUSSED PROCEDURE, BENEFITS, & RISKS: < 1% chance of medication reaction, bleeding, perforation, or rupture of spleen/liver.

## 2017-06-24 ENCOUNTER — Other Ambulatory Visit: Payer: Self-pay

## 2017-06-24 ENCOUNTER — Telehealth: Payer: Self-pay | Admitting: *Deleted

## 2017-06-24 ENCOUNTER — Other Ambulatory Visit: Payer: Self-pay | Admitting: Cardiovascular Disease

## 2017-06-24 ENCOUNTER — Telehealth: Payer: Self-pay | Admitting: Gastroenterology

## 2017-06-24 DIAGNOSIS — K921 Melena: Secondary | ICD-10-CM

## 2017-06-24 NOTE — Telephone Encounter (Signed)
I spoke to Pt's wife. She said when she called PCP they said they could draw blood but it would have to go to [place] they are requesting just fax orders to [facility].  I have faxed to [facility] with note to fax results here.

## 2017-06-24 NOTE — Telephone Encounter (Signed)
Called PCP and was on hold for 23 minutes. Will try calling back

## 2017-06-24 NOTE — Telephone Encounter (Signed)
Called patient TO DISCUSS CONCERNS. AFTER PT CALLED HAVING VERY BLACK STOOL ONCE. TWICE ON SAT. ONCE ON SUN. LOVENOX WAS LAST SHOT. NO BMS WITHOUT BLOOD OR BLACK STOOLS. SAT FELT BLOATED. NO CHEST PAIN OR SOB. USUALLY TAKE COUMADIN AT NIGHT. LIVES IN [place]. EXPLAINED NOW THAT. LOVENOX IS OFF MAY BE LESS BLEEDING.  GO TO DR. Teryl Lucy TODAY. CALL DR. Ervin Knack [facility] TO MAKE THEM AWARE HE NEEDS TO COME IN FOR LABS TODAY. FAX ORDER FOR CBC/PT/INR.  FAX INFO TO DR. Mahina Salatino [phone].

## 2017-06-24 NOTE — Telephone Encounter (Signed)
Spouse called. Patient had a large stool on Saturday and it was "black mixed with blood". Saturday evening he had another bowel movement and it "black little balls with some blood in the commode". He had another BM last night with dark stool and bright red blood. Patient had colonoscopy last week. Please advise Dr. Oneida Alar.

## 2017-06-24 NOTE — Telephone Encounter (Signed)
Pt's wife called to say that she just spoke with [facility] and the patient's PCP told her that we needed to fax the lab order to [facility] in [place]. I told her that I would let [facility] nurse be aware. [phone]

## 2017-06-24 NOTE — Telephone Encounter (Signed)
See other phone note created. Doris spoke with spouse and orders are being faxed to [facility].

## 2017-06-25 ENCOUNTER — Telehealth: Payer: Self-pay

## 2017-06-25 LAB — CBC WITH DIFFERENTIAL/PLATELET
BASOS ABS: 0 10*3/uL (ref 0.0–0.2)
Basos: 1 %
EOS (ABSOLUTE): 0.2 10*3/uL (ref 0.0–0.4)
Eos: 3 %
HEMOGLOBIN: 13 g/dL (ref 13.0–17.7)
Hematocrit: 38.5 % (ref 37.5–51.0)
Immature Grans (Abs): 0 10*3/uL (ref 0.0–0.1)
Immature Granulocytes: 0 %
LYMPHS ABS: 1.6 10*3/uL (ref 0.7–3.1)
Lymphs: 28 %
MCH: 28.2 pg (ref 26.6–33.0)
MCHC: 33.8 g/dL (ref 31.5–35.7)
MCV: 84 fL (ref 79–97)
MONOCYTES: 13 %
MONOS ABS: 0.8 10*3/uL (ref 0.1–0.9)
NEUTROS ABS: 3.2 10*3/uL (ref 1.4–7.0)
Neutrophils: 55 %
Platelets: 212 10*3/uL (ref 150–450)
RBC: 4.61 x10E6/uL (ref 4.14–5.80)
RDW: 14.9 % (ref 12.3–15.4)
WBC: 5.8 10*3/uL (ref 3.4–10.8)

## 2017-06-25 LAB — PROTIME-INR
INR: 1.2 (ref 0.8–1.2)
PROTHROMBIN TIME: 11.9 s (ref 9.1–12.0)

## 2017-06-25 NOTE — Telephone Encounter (Signed)
Pt notified of results

## 2017-06-25 NOTE — Telephone Encounter (Signed)
Pt's spouse called inquiring about pts lab work drawn yesterday. They were concerned with the results s/p pts complication after his procedure last week.

## 2017-06-25 NOTE — Telephone Encounter (Signed)
PLEASE CALL PT. His Hb is normal.

## 2017-06-26 ENCOUNTER — Encounter: Payer: Self-pay | Admitting: Pharmacist

## 2017-06-27 ENCOUNTER — Encounter (HOSPITAL_COMMUNITY): Payer: Self-pay | Admitting: Gastroenterology

## 2017-06-27 NOTE — Telephone Encounter (Signed)
FOUR SIMPLE ADENOMAS AND TWO POLYPOID LESIONS REMOVED.   DRINK WATER TO KEEP YOUR URINE LIGHT YELLOW.  FOLLOW A HIGH FIBER DIET. AVOID ITEMS THAT CAUSE BLOATING.   USE PREPARATION H FOUR TIMES  A DAY IF NEEDED TO RELIEVE RECTAL PAIN/PRESSURE/BLEEDING.  Next colonoscopy in 3 years.

## 2017-06-28 NOTE — Telephone Encounter (Signed)
Lmom, waiting on a return call.  

## 2017-07-01 NOTE — Telephone Encounter (Signed)
Called patient TO DISCUSS GI BLEED. WIFE STATES STOOLS ARE BACK TO NORMAL AS OF SAT JUN 15.

## 2017-07-01 NOTE — Progress Notes (Signed)
CC'D TO PCP °

## 2017-07-01 NOTE — Telephone Encounter (Signed)
Pts spouse notified of results

## 2017-07-03 ENCOUNTER — Ambulatory Visit (INDEPENDENT_AMBULATORY_CARE_PROVIDER_SITE_OTHER): Payer: Medicare Other | Admitting: Cardiovascular Disease

## 2017-07-03 ENCOUNTER — Encounter: Payer: Self-pay | Admitting: Cardiovascular Disease

## 2017-07-03 ENCOUNTER — Other Ambulatory Visit: Payer: Self-pay | Admitting: Pharmacist

## 2017-07-03 VITALS — BP 126/72 | HR 84 | Ht 66.0 in | Wt 212.0 lb

## 2017-07-03 DIAGNOSIS — R011 Cardiac murmur, unspecified: Secondary | ICD-10-CM

## 2017-07-03 DIAGNOSIS — I10 Essential (primary) hypertension: Secondary | ICD-10-CM

## 2017-07-03 DIAGNOSIS — G4733 Obstructive sleep apnea (adult) (pediatric): Secondary | ICD-10-CM | POA: Diagnosis not present

## 2017-07-03 DIAGNOSIS — I482 Chronic atrial fibrillation, unspecified: Secondary | ICD-10-CM

## 2017-07-03 DIAGNOSIS — E785 Hyperlipidemia, unspecified: Secondary | ICD-10-CM

## 2017-07-03 MED ORDER — FUROSEMIDE 40 MG PO TABS: 20.0000 mg | ORAL_TABLET | Freq: Every day | ORAL | 1 refills | Status: DC | PRN

## 2017-07-03 MED ORDER — METOPROLOL TARTRATE 25 MG PO TABS: 25.0000 mg | ORAL_TABLET | Freq: Every day | ORAL | 2 refills | Status: DC

## 2017-07-03 MED ORDER — DIGOXIN 250 MCG PO TABS: 125.0000 ug | ORAL_TABLET | Freq: Every day | ORAL | 3 refills | Status: DC

## 2017-07-03 MED ORDER — DILTIAZEM HCL ER COATED BEADS 240 MG PO CP24: ORAL_CAPSULE | ORAL | 0 refills | Status: DC

## 2017-07-03 MED ORDER — LOSARTAN POTASSIUM 50 MG PO TABS: 50.0000 mg | ORAL_TABLET | Freq: Every day | ORAL | 2 refills | Status: DC

## 2017-07-03 MED ORDER — APIXABAN 5 MG PO TABS: 5.0000 mg | ORAL_TABLET | Freq: Two times a day (BID) | ORAL | 1 refills | Status: DC

## 2017-07-03 NOTE — Assessment & Plan Note (Signed)
History of dyslipidemia on statin therapy 

## 2017-07-03 NOTE — Assessment & Plan Note (Signed)
History of cardiac murmur with echo performed 11/66/14 revealing normal LV systolic function with mild aortic stenosis.  We will recheck a 2D echocardiogram.

## 2017-07-03 NOTE — Progress Notes (Signed)
07/03/2017 Avis Epley Degrave   [birth year]  [id number]  Primary Physician Andres Shad, MD Primary Cardiologist: Lorretta Harp MD Lupe Carney, [place]  HPI:  William Preston is a 67 y.o.  moderately-overweight married Caucasian male, father of 21, whom I last saw in the office  07/03/2016. He has a history of chronic AFib, rate controlled, on Coumadin anticoagulation with INRs in the 2 range followed here in the office by Erasmo Downer. He has had 2 failed attempts at cardioversion in the past. He has normal coronary arteries and normal LV function by catheterization performed by me May 25, 2008. His other problems include obstructive sleep apnea and on CPAP, hypertension, hyperlipidemia, and type 2 diabetes. He denies chest pain or shortness of breath. Since I seen him a year ago he's remained currently stable. He had a bladder stone surgically removed which went without complication. He did stop his Coumadin 5 days prior. Since I saw him last he's remained currently stable.  He and his wife had a 3-week trip last summer to the [place], [place] and [place] and are planning a trip to the [employer] [place] this summer.      Current Meds  Medication Sig  . Ascorbic Acid (VITAMIN C) 1000 MG tablet Take 1,000 mg by mouth daily.  . cholecalciferol (VITAMIN D) 1000 units tablet Take 1,000 Units by mouth daily.  . digoxin (DIGOX) 0.25 MG tablet Take 0.5 tablets (125 mcg total) by mouth daily.  Marland Kitchen diltiazem (CARDIZEM CD) 240 MG 24 hr capsule TAKE 1 CAPSULE BY MOUTH EVERY DAY  . doxazosin (CARDURA) 2 MG tablet Take 2 mg by mouth daily.  . finasteride (PROSCAR) 5 MG tablet Take 5 mg by mouth daily.  . furosemide (LASIX) 40 MG tablet 1/2 TABLET BY MOUTH EVERY DAY AS NEEDED  . glyBURIDE-metformin (GLUCOVANCE) 2.5-500 MG per tablet Take 1 tablet by mouth 3 (three) times daily with meals.   Marland Kitchen losartan (COZAAR) 50 MG tablet Take 1 tablet (50 mg total) by mouth daily.  .  metoprolol tartrate (LOPRESSOR) 25 MG tablet TAKE 1/2 BY MOUTH 2 TIMES DAILY GENERIC FOR LOPRESSOR (Patient taking differently: Take 25 mg by mouth daily. )  . Multiple Vitamins-Minerals (MULTIVITAMIN WITH MINERALS) tablet Take 1 tablet by mouth daily.  . pravastatin (PRAVACHOL) 80 MG tablet Take 1 tablet (80 mg total) by mouth daily.  . vitamin B-12 (CYANOCOBALAMIN) 1000 MCG tablet Take 1,000 mcg by mouth daily.  . vitamin E 400 UNIT capsule Take 400 Units by mouth daily.  Marland Kitchen warfarin (COUMADIN) 5 MG tablet Take 5-7.5 mg by mouth See admin instructions. Pt takes 5 mg Mon, Wed Fri and 7.5 mg all other days  . [DISCONTINUED] enoxaparin (LOVENOX) 100 MG/ML injection Inject 0.9 mLs (90 mg total) into the skin 2 (two) times daily.     No Known Allergies  Social History   Socioeconomic History  . Marital status: Married    Spouse name: Not on file  . Number of children: Not on file  . Years of education: Not on file  . Highest education level: Not on file  Occupational History  . Occupation: retired    Comment: [employer] in [place]  . Financial resource strain: Not on file  . Food insecurity:    Worry: Not on file    Inability: Not on file  . Transportation needs:    Medical: Not on file    Non-medical: Not on file  Tobacco Use  . Smoking status: Never Smoker  . Smokeless tobacco: Never Used  Substance and Sexual Activity  . Alcohol use: No  . Drug use: No  . Sexual activity: Not on file  Lifestyle  . Physical activity:    Days per week: Not on file    Minutes per session: Not on file  . Stress: Not on file  Relationships  . Social connections:    Talks on phone: Not on file    Gets together: Not on file    Attends religious service: Not on file    Active member of club or organization: Not on file    Attends meetings of clubs or organizations: Not on file    Relationship status: Not on file  . Intimate partner violence:    Fear of current or ex partner:  Not on file    Emotionally abused: Not on file    Physically abused: Not on file    Forced sexual activity: Not on file  Other Topics Concern  . Not on file  Social History Narrative  . Not on file     Review of Systems: General: negative for chills, fever, night sweats or weight changes.  Cardiovascular: negative for chest pain, dyspnea on exertion, edema, orthopnea, palpitations, paroxysmal nocturnal dyspnea or shortness of breath Dermatological: negative for rash Respiratory: negative for cough or wheezing Urologic: negative for hematuria Abdominal: negative for nausea, vomiting, diarrhea, bright red blood per rectum, melena, or hematemesis Neurologic: negative for visual changes, syncope, or dizziness All other systems reviewed and are otherwise negative except as noted above.    Blood pressure 126/72, pulse 84, height 5\' 6"  (1.676 m), weight 212 lb (96.2 kg).  General appearance: alert and no distress Neck: no adenopathy, no carotid bruit, no JVD, supple, symmetrical, trachea midline and thyroid not enlarged, symmetric, no tenderness/mass/nodules Lungs: clear to auscultation bilaterally Heart: irregularly irregular rhythm and Soft outflow tract murmur consistent with aortic stenosis Extremities: extremities normal, atraumatic, no cyanosis or edema Pulses: 2+ and symmetric Skin: Skin color, texture, turgor normal. No rashes or lesions Neurologic: Alert and oriented X 3, normal strength and tone. Normal symmetric reflexes. Normal coordination and gait  EKG atrial fibrillation with a ventricular response of 84 and septal Q waves.  I personally reviewed this EKG  ASSESSMENT AND PLAN:   OSA- on C-pap History of obstructive sleep apnea on CPAP  HYPERTENSION, BENIGN History of essential hypertension with blood pressure measured today at 126/72.  He is on diltiazem and losartan as well as metoprolol.  Continue current meds at current dosing  Chronic atrial fibrillation History  of chronic A. fib rate controlled on Coumadin anticoagulation.  We discussed transitioning him to a DO AC.  Dyslipidemia History of dyslipidemia on statin therapy  Murmur, cardiac History of cardiac murmur with echo performed 11/66/14 revealing normal LV systolic function with mild aortic stenosis.  We will recheck a 2D echocardiogram.      Lorretta Harp MD [facility], [facility] 07/03/2017 11:40 AM

## 2017-07-03 NOTE — Telephone Encounter (Signed)
Pt was started on Eliquis for atrial fibrillation on 07/03/17  Reviewed patients medication list.  Dose appropriate based on age, weight, and SCr.    A full discussion of the nature of anticoagulants has been carried out.  A benefit/risk analysis has been presented to the patient, so that they understand the justification for choosing anticoagulation with Eliquis at this time.  The need for compliance is stressed.  Pt is aware to take the medication twice daily.  Side effects of potential bleeding are discussed, including unusual colored urine or stools, coughing up blood or coffee ground emesis, nose bleeds or serious fall or head trauma.  Discussed signs and symptoms of stroke. The patient should avoid any OTC items containing aspirin or ibuprofen.  Avoid alcohol consumption.   Call if any signs of abnormal bleeding.  Discussed financial obligations and resolved any difficulty in obtaining medication.  Next lab test in 6 months.

## 2017-07-03 NOTE — Assessment & Plan Note (Signed)
History of chronic A. fib rate controlled on Coumadin anticoagulation.  We discussed transitioning him to a DO AC.

## 2017-07-03 NOTE — Telephone Encounter (Signed)
This encounter was created in error - please disregard.

## 2017-07-03 NOTE — Assessment & Plan Note (Signed)
History of obstructive sleep apnea on CPAP. 

## 2017-07-03 NOTE — Patient Instructions (Signed)
Medication Instructions: Your physician recommends that you continue on your current medications as directed. Please refer to the Current Medication list given to you today.  Testing/Procedures: Your physician has requested that you have an echocardiogram. Echocardiography is a painless test that uses sound waves to create images of your heart. It provides your doctor with information about the size and shape of your heart and how well your heart's chambers and valves are working. This procedure takes approximately one hour. There are no restrictions for this procedure.  Follow-Up: Your physician wants you to follow-up in: 1 year with Dr. Berry. You will receive a reminder letter in the mail two months in advance. If you don't receive a letter, please call our office to schedule the follow-up appointment.  If you need a refill on your cardiac medications before your next appointment, please call your pharmacy.  

## 2017-07-03 NOTE — Assessment & Plan Note (Signed)
History of essential hypertension with blood pressure measured today at 126/72.  He is on diltiazem and losartan as well as metoprolol.  Continue current meds at current dosing

## 2017-07-09 ENCOUNTER — Other Ambulatory Visit: Payer: Self-pay

## 2017-07-09 ENCOUNTER — Ambulatory Visit (HOSPITAL_COMMUNITY): Payer: Medicare Other | Attending: Cardiovascular Disease

## 2017-07-09 DIAGNOSIS — E785 Hyperlipidemia, unspecified: Secondary | ICD-10-CM | POA: Insufficient documentation

## 2017-07-09 DIAGNOSIS — I10 Essential (primary) hypertension: Secondary | ICD-10-CM | POA: Diagnosis not present

## 2017-07-09 DIAGNOSIS — I482 Chronic atrial fibrillation, unspecified: Secondary | ICD-10-CM

## 2017-07-09 DIAGNOSIS — E119 Type 2 diabetes mellitus without complications: Secondary | ICD-10-CM | POA: Diagnosis not present

## 2017-07-09 DIAGNOSIS — I071 Rheumatic tricuspid insufficiency: Secondary | ICD-10-CM | POA: Diagnosis not present

## 2017-07-09 DIAGNOSIS — R011 Cardiac murmur, unspecified: Secondary | ICD-10-CM | POA: Insufficient documentation

## 2017-07-10 ENCOUNTER — Other Ambulatory Visit: Payer: Self-pay | Admitting: *Deleted

## 2017-07-10 DIAGNOSIS — I350 Nonrheumatic aortic (valve) stenosis: Secondary | ICD-10-CM

## 2017-07-23 ENCOUNTER — Telehealth: Payer: Self-pay | Admitting: Cardiovascular Disease

## 2017-07-23 NOTE — Telephone Encounter (Signed)
Turmeric is a natural anticoagulant.  He should NOT take it, as he is on warfarin.  Would increase bleed/bruise risk with no added benefit

## 2017-07-23 NOTE — Telephone Encounter (Signed)
New Message:     Pt c/o medication issue:  1. Name of Medication: [place] (Supplement)  2. How are you currently taking this medication (dosage and times per day)? 1x a day/500 mg  3. Are you having a reaction (difficulty breathing--STAT)? No  4. What is your medication issue? Pt's wife would like to know if there will be any interaction if he starts to take this medication with everything else he is taking.

## 2017-07-23 NOTE — Telephone Encounter (Signed)
Routed to [facility] to review/advise

## 2017-07-23 NOTE — Telephone Encounter (Signed)
Patients wife aware

## 2017-08-22 ENCOUNTER — Other Ambulatory Visit: Payer: Self-pay | Admitting: *Deleted

## 2017-08-22 MED ORDER — APIXABAN 5 MG PO TABS: 5.0000 mg | ORAL_TABLET | Freq: Two times a day (BID) | ORAL | 0 refills | Status: DC

## 2017-08-22 NOTE — Telephone Encounter (Signed)
Patients wife left a msg on the refill vm requesting a ninety day rx for eliquis [facility] sent [facility].

## 2017-08-28 ENCOUNTER — Other Ambulatory Visit: Payer: Self-pay | Admitting: *Deleted

## 2017-08-28 NOTE — Telephone Encounter (Signed)
Patient left a msg on the refill vm stating that he called last Thursday and requested a ninety day rx on the eliquis. He would like a call back at [phone]. Thanks, MI

## 2017-08-29 MED ORDER — APIXABAN 5 MG PO TABS: 5.0000 mg | ORAL_TABLET | Freq: Two times a day (BID) | ORAL | 1 refills | Status: DC

## 2017-08-29 NOTE — Telephone Encounter (Signed)
[place] TO PREFER PHARMACY. LMOM to confirm

## 2017-09-20 ENCOUNTER — Other Ambulatory Visit: Payer: Self-pay | Admitting: Cardiovascular Disease

## 2017-09-25 ENCOUNTER — Other Ambulatory Visit: Payer: Self-pay | Admitting: Cardiovascular Disease

## 2017-09-25 NOTE — Telephone Encounter (Signed)
Rx request sent to pharmacy.  

## 2017-10-22 ENCOUNTER — Ambulatory Visit: Payer: Self-pay | Admitting: Pharmacist Clinician (PhC)/ Clinical Pharmacy Specialist

## 2017-10-22 DIAGNOSIS — Z7901 Long term (current) use of anticoagulants: Secondary | ICD-10-CM

## 2017-10-22 DIAGNOSIS — I482 Chronic atrial fibrillation, unspecified: Secondary | ICD-10-CM

## 2017-11-22 ENCOUNTER — Telehealth: Payer: Self-pay | Admitting: Cardiovascular Disease

## 2017-11-22 NOTE — Telephone Encounter (Signed)
New message   Patient's wife needs to know what type of blood work needs to be done? Please call to discuss.

## 2017-11-22 NOTE — Telephone Encounter (Signed)
Spoke to [employer] and verbalized understanding.

## 2017-11-22 NOTE — Telephone Encounter (Signed)
lmtcb

## 2017-11-22 NOTE — Telephone Encounter (Signed)
Returned call to wife-she states patient was started on Eliquis and was suppose to get lab work completed.   She is unsure what this is and when this needs to be completed.    He has appt at PCP on 11/25 where he can get this done if needed.     Routed to [place] to review (BMET?)

## 2017-11-22 NOTE — Telephone Encounter (Signed)
Please tell them to disregard.  We have a BMET from May that is still fine for Eliquis dosing.  Just tell him that if his PCP does draw labs to get [place] a copy.

## 2017-12-24 ENCOUNTER — Other Ambulatory Visit: Payer: Self-pay | Admitting: Cardiovascular Disease

## 2018-02-24 ENCOUNTER — Other Ambulatory Visit: Payer: Self-pay | Admitting: Cardiovascular Disease

## 2018-03-28 ENCOUNTER — Other Ambulatory Visit: Payer: Self-pay | Admitting: Cardiovascular Disease

## 2018-05-25 ENCOUNTER — Other Ambulatory Visit: Payer: Self-pay | Admitting: Cardiovascular Disease

## 2018-05-25 NOTE — Telephone Encounter (Signed)
Lopressor refilled. 

## 2018-06-04 ENCOUNTER — Telehealth: Payer: Self-pay | Admitting: *Deleted

## 2018-06-04 NOTE — Telephone Encounter (Signed)
A message was left, re: follow up visit. 

## 2018-06-18 ENCOUNTER — Other Ambulatory Visit: Payer: Self-pay | Admitting: Cardiovascular Disease

## 2018-07-12 ENCOUNTER — Other Ambulatory Visit: Payer: Self-pay | Admitting: Cardiovascular Disease

## 2018-07-24 ENCOUNTER — Telehealth: Payer: Self-pay | Admitting: Cardiovascular Disease

## 2018-07-24 NOTE — Telephone Encounter (Signed)
New Message      [facility] Pre-operative Risk Assessment    Request for surgical clearance:  1. What type of surgery is being performed? Deep Impacted Wisdom Tooth with a cyst    2. When is this surgery scheduled? 09/08/18  3. What type of clearance is required (medical clearance vs. Pharmacy clearance to hold med vs. Both)? Both  4. Are there any medications that need to be held prior to surgery and how long? Hold Eliquis for 2 days  5. Practice name and name of physician performing surgery? [facility] Dr. Meade Maw    6. What is your office phone number [phone]   7.   What is your office fax number [phone]  8.   Anesthesia type (None, local, MAC, general) ? Fentanyl and versed   Avaletta L Williams 07/24/2018, 3:13 PM  _________________________________________________________________   (provider comments below)

## 2018-07-24 NOTE — Telephone Encounter (Signed)
PharmD to review eliquis. Patient's last visit is >1 year ago. Patient has visit with Dr. Gwenlyn Found on 8/18, cardiac clearance can be addressed at that time.

## 2018-07-25 NOTE — Telephone Encounter (Signed)
Patient with diagnosis of afib on Eliquis for anticoagulation.    Procedure: Deep Impacted Wisdom Tooth with a cyst   Date of procedure: 09/08/2018  CHADS2-VASc score of  3 ( HTN, AGE, DM2)  CrCl 48ml/min  Per office protocol, patient can hold Eliquis for 2 days prior to procedure.

## 2018-07-28 NOTE — Telephone Encounter (Signed)
   Primary Cardiologist: No primary care provider on file.  Chart reviewed as part of pre-operative protocol coverage. Given past medical history and time since last visit, based on ACC/AHA guidelines, OSBORN PULLIN would be at acceptable risk for the planned procedure without further cardiovascular testing.   See below for recommendations on anticoagulation.  Pt with chronic a fib and normal coronary arteries..   I will route this recommendation to the requesting party via Epic fax function and remove from pre-op pool.  Please call with questions.  Cecilie Kicks, NP 07/28/2018, 9:28 AM

## 2018-08-18 ENCOUNTER — Other Ambulatory Visit: Payer: Self-pay | Admitting: Cardiovascular Disease

## 2018-08-21 ENCOUNTER — Other Ambulatory Visit: Payer: Self-pay | Admitting: Cardiovascular Disease

## 2018-08-22 ENCOUNTER — Ambulatory Visit (HOSPITAL_COMMUNITY): Payer: Medicare Other | Attending: Internal Medicine

## 2018-08-22 ENCOUNTER — Other Ambulatory Visit: Payer: Self-pay

## 2018-08-22 DIAGNOSIS — I350 Nonrheumatic aortic (valve) stenosis: Secondary | ICD-10-CM | POA: Insufficient documentation

## 2018-08-25 ENCOUNTER — Telehealth: Payer: Self-pay

## 2018-08-25 NOTE — Telephone Encounter (Signed)
Prior Auth started for Rx Digoxin 23mcg tab. KEY: [id number]

## 2018-08-28 ENCOUNTER — Telehealth: Payer: Self-pay

## 2018-08-28 NOTE — Telephone Encounter (Signed)
Opened in error

## 2018-09-01 ENCOUNTER — Telehealth: Payer: Self-pay

## 2018-09-01 MED ORDER — DIGOXIN 250 MCG PO TABS: ORAL_TABLET | ORAL | 3 refills | Status: DC

## 2018-09-01 NOTE — Telephone Encounter (Signed)
Prior Auth has been approved for Rx Digoxin 247mcg

## 2018-09-02 ENCOUNTER — Ambulatory Visit (INDEPENDENT_AMBULATORY_CARE_PROVIDER_SITE_OTHER): Payer: Medicare Other | Admitting: Cardiovascular Disease

## 2018-09-02 ENCOUNTER — Other Ambulatory Visit: Payer: Self-pay

## 2018-09-02 ENCOUNTER — Encounter: Payer: Self-pay | Admitting: Cardiovascular Disease

## 2018-09-02 DIAGNOSIS — I350 Nonrheumatic aortic (valve) stenosis: Secondary | ICD-10-CM | POA: Insufficient documentation

## 2018-09-02 DIAGNOSIS — I10 Essential (primary) hypertension: Secondary | ICD-10-CM | POA: Diagnosis not present

## 2018-09-02 DIAGNOSIS — E785 Hyperlipidemia, unspecified: Secondary | ICD-10-CM

## 2018-09-02 DIAGNOSIS — G4733 Obstructive sleep apnea (adult) (pediatric): Secondary | ICD-10-CM | POA: Diagnosis not present

## 2018-09-02 DIAGNOSIS — I482 Chronic atrial fibrillation, unspecified: Secondary | ICD-10-CM

## 2018-09-02 HISTORY — DX: Nonrheumatic aortic (valve) stenosis: I35.0

## 2018-09-02 NOTE — Assessment & Plan Note (Signed)
History of dyslipidemia on pravastatin.  We will recheck a lipid liver profile today

## 2018-09-02 NOTE — Assessment & Plan Note (Signed)
History of chronic atrial fibrillation rate controlled on Eliquis oral anticoagulation. ?

## 2018-09-02 NOTE — Assessment & Plan Note (Signed)
History of essential hypertension with blood pressure measured today at 129/80.  He is on diltiazem and losartan.

## 2018-09-02 NOTE — Assessment & Plan Note (Signed)
History of obstructive sleep apnea on CPAP. 

## 2018-09-02 NOTE — Assessment & Plan Note (Signed)
History of mild aortic stenosis with recent echo performed 08/22/2018 revealing normal LV systolic function with an aortic valve area of 1.067 cm and a peak gradient of 25 mmHg.  We will recheck in 12 months

## 2018-09-02 NOTE — Patient Instructions (Signed)
Medication Instructions:  Your physician recommends that you continue on your current medications as directed. Please refer to the Current Medication list given to you today.  If you need a refill on your cardiac medications before your next appointment, please call your pharmacy.   Lab work: Your physician recommends that you return for lab work in 1-2 DAY: [place] If you have labs (blood work) drawn today and your tests are completely normal, you will receive your results only by: Marland Kitchen MyChart Message (if you have MyChart) OR . A paper copy in the mail If you have any lab test that is abnormal or we need to change your treatment, we will call you to review the results.  Testing/Procedures: Your physician has requested that you have an echocardiogram. Echocardiography is a painless test that uses sound waves to create images of your heart. It provides your doctor with information about the size and shape of your heart and how well your heart's chambers and valves are working. This procedure takes approximately one hour. There are no restrictions for this procedure. LOCATION: [facility]: [address], [place], [place] [address] TO BE SCHEDULED FOR 12 MONTHS   Follow-Up: At [facility], you and your health needs are our priority.  As part of our continuing mission to provide you with exceptional heart care, we have created designated Provider Care Teams.  These Care Teams include your primary Cardiologist (physician) and Advanced Practice Providers (APPs -  Physician Assistants and Nurse Practitioners) who all work together to provide you with the care you need, when you need it. You will need a follow up appointment in 12 months with Dr. Quay Burow.  Please call our office 2 months in advance to schedule this/each appointment.

## 2018-09-02 NOTE — Progress Notes (Signed)
09/02/2018 [name]   03-01-1950  [id number]  Primary Physician Andres Shad, MD Primary Cardiologist: Lorretta Harp MD Lupe Carney, [place]  HPI:  William Preston is a 68 y.o.  moderately-overweight married Caucasian male, father of 18, whom I last saw in the office  07/03/2017. He has a history of chronic AFib, rate controlled, on Coumadin anticoagulation with INRs in the 2 range followed here in the office by Erasmo Downer. He has had 2 failed attempts at cardioversion in the past. He has normal coronary arteries and normal LV function by catheterization performed by me May 25, 2008. His other problems include obstructive sleep apnea and on CPAP, hypertension, hyperlipidemia, and type 2 diabetes. He denies chest pain or shortness of breath. Since I seen him a year ago he's remained currently stable. [place] bladder stone surgically removed whichwent without complication. He did stop his Coumadin 5 days prior. Since I saw him last he's remained currently stable.  He and his wife had a 3-week trip last summer to the [place], [place] and [place] and are planning a trip to the [employer] [place] this summer.  He also visited the [facility] as well.  Since I saw him a year ago he is complained of occasional dizziness.  Denies chest pain or shortness of breath.  He has transitioned to Eliquis.   Current Meds  Medication Sig  . Ascorbic Acid (VITAMIN C) 1000 MG tablet Take 1,000 mg by mouth daily.  . cholecalciferol (VITAMIN D) 1000 units tablet Take 1,000 Units by mouth daily.  . digoxin (LANOXIN) 0.25 MG tablet Take 1/2 tablet daily. KEEP SCHEDULED OV  . diltiazem (CARDIZEM CD) 240 MG 24 hr capsule TAKE 1 CAPSULE BY MOUTH EVERY DAY  . diltiazem (CARDIZEM CD) 240 MG 24 hr capsule TAKE 1 CAPSULE BY MOUTH EVERY DAY  . doxazosin (CARDURA) 2 MG tablet Take 2 mg by mouth daily.  Marland Kitchen ELIQUIS 5 MG TABS tablet TAKE 1 TABLET BY MOUTH TWICE A DAY  . finasteride  (PROSCAR) 5 MG tablet Take 5 mg by mouth daily.  . furosemide (LASIX) 40 MG tablet TAKE 1/2 TABLET BY MOUTH EVERY DAY AS NEEDED  . glyBURIDE-metformin (GLUCOVANCE) 2.5-500 MG per tablet Take 1 tablet by mouth 3 (three) times daily with meals.   Marland Kitchen losartan (COZAAR) 50 MG tablet TAKE 1 TABLET BY MOUTH EVERY DAY  . metoprolol tartrate (LOPRESSOR) 25 MG tablet TAKE 1 TABLET BY MOUTH EVERY DAY  . Multiple Vitamins-Minerals (MULTIVITAMIN WITH MINERALS) tablet Take 1 tablet by mouth daily.  . pravastatin (PRAVACHOL) 80 MG tablet TAKE 1 TABLET BY MOUTH EVERY DAY  . vitamin B-12 (CYANOCOBALAMIN) 1000 MCG tablet Take 1,000 mcg by mouth daily.  . vitamin E 400 UNIT capsule Take 400 Units by mouth daily.     No Known Allergies  Social History   Socioeconomic History  . Marital status: Married    Spouse name: Not on file  . Number of children: Not on file  . Years of education: Not on file  . Highest education level: Not on file  Occupational History  . Occupation: retired    Comment: [employer] in [place]  . Financial resource strain: Not on file  . Food insecurity    Worry: Not on file    Inability: Not on file  . Transportation needs    Medical: Not on file    Non-medical: Not on file  Tobacco Use  .  Smoking status: Never Smoker  . Smokeless tobacco: Never Used  Substance and Sexual Activity  . Alcohol use: No  . Drug use: No  . Sexual activity: Not on file  Lifestyle  . Physical activity    Days per week: Not on file    Minutes per session: Not on file  . Stress: Not on file  Relationships  . Social Herbalist on phone: Not on file    Gets together: Not on file    Attends religious service: Not on file    Active member of club or organization: Not on file    Attends meetings of clubs or organizations: Not on file    Relationship status: Not on file  . Intimate partner violence    Fear of current or ex partner: Not on file    Emotionally abused:  Not on file    Physically abused: Not on file    Forced sexual activity: Not on file  Other Topics Concern  . Not on file  Social History Narrative  . Not on file     Review of Systems: General: negative for chills, fever, night sweats or weight changes.  Cardiovascular: negative for chest pain, dyspnea on exertion, edema, orthopnea, palpitations, paroxysmal nocturnal dyspnea or shortness of breath Dermatological: negative for rash Respiratory: negative for cough or wheezing Urologic: negative for hematuria Abdominal: negative for nausea, vomiting, diarrhea, bright red blood per rectum, melena, or hematemesis Neurologic: negative for visual changes, syncope, or dizziness All other systems reviewed and are otherwise negative except as noted above.    Blood pressure 129/80, pulse 67, height 5\' 9"  (1.753 m), weight 206 lb (93.4 kg), SpO2 95 %.  General appearance: alert and no distress Neck: no adenopathy, no carotid bruit, no JVD, supple, symmetrical, trachea midline and thyroid not enlarged, symmetric, no tenderness/mass/nodules Lungs: clear to auscultation bilaterally Heart: irregularly irregular rhythm Extremities: extremities normal, atraumatic, no cyanosis or edema Pulses: 2+ and symmetric Skin: Skin color, texture, turgor normal. No rashes or lesions Neurologic: Alert and oriented X 3, normal strength and tone. Normal symmetric reflexes. Normal coordination and gait  EKG atrial fibrillation with a ventricular response of 67, and septal Q waves.  I personally reviewed this EKG.  ASSESSMENT AND PLAN:   OSA- on C-pap History of obstructive sleep apnea on CPAP  HYPERTENSION, BENIGN History of essential hypertension with blood pressure measured today at 129/80.  He is on diltiazem and losartan.  Chronic atrial fibrillation History of chronic atrial fibrillation rate controlled on Eliquis oral anticoagulation  Dyslipidemia History of dyslipidemia on pravastatin.  We will  recheck a lipid liver profile today  Mild aortic stenosis History of mild aortic stenosis with recent echo performed 08/22/2018 revealing normal LV systolic function with an aortic valve area of 1.067 cm and a peak gradient of 25 mmHg.  We will recheck in 12 months      Lorretta Harp MD [facility], [facility] 09/02/2018 12:23 PM

## 2018-09-03 ENCOUNTER — Other Ambulatory Visit: Payer: Self-pay | Admitting: Cardiovascular Disease

## 2018-09-03 LAB — LIPID PANEL
Chol/HDL Ratio: 3.7 ratio (ref 0.0–5.0)
Cholesterol, Total: 121 mg/dL (ref 100–199)
HDL: 33 mg/dL — ABNORMAL LOW (ref >39)
LDL Calculated: 48 mg/dL (ref 0–99)
Triglycerides: 202 mg/dL — ABNORMAL HIGH (ref 0–149)
VLDL Cholesterol Cal: 40 mg/dL (ref 5–40)

## 2018-09-03 LAB — HEPATIC FUNCTION PANEL
ALT: 20 IU/L (ref 0–44)
AST: 20 IU/L (ref 0–40)
Albumin: 4.6 g/dL (ref 3.8–4.8)
Alkaline Phosphatase: 51 IU/L (ref 39–117)
Bilirubin Total: 0.6 mg/dL (ref 0.0–1.2)
Bilirubin, Direct: 0.2 mg/dL (ref 0.00–0.40)
Total Protein: 7 g/dL (ref 6.0–8.5)

## 2018-09-03 NOTE — Telephone Encounter (Signed)
Pt calling requesting a refill on these medications. Please address

## 2018-09-04 ENCOUNTER — Encounter: Payer: Self-pay | Admitting: *Deleted

## 2018-09-04 ENCOUNTER — Telehealth: Payer: Self-pay | Admitting: Cardiovascular Disease

## 2018-09-04 DIAGNOSIS — I350 Nonrheumatic aortic (valve) stenosis: Secondary | ICD-10-CM

## 2018-09-04 MED ORDER — DIGOXIN 250 MCG PO TABS: ORAL_TABLET | ORAL | 3 refills | Status: DC

## 2018-09-04 MED ORDER — APIXABAN 5 MG PO TABS: 5.0000 mg | ORAL_TABLET | Freq: Two times a day (BID) | ORAL | 1 refills | Status: DC

## 2018-09-04 MED ORDER — METOPROLOL TARTRATE 25 MG PO TABS: 25.0000 mg | ORAL_TABLET | Freq: Every day | ORAL | 3 refills | Status: DC

## 2018-09-04 NOTE — Telephone Encounter (Signed)
New Message     Pts wife is calling and says the pt has an Echo that is scheduled but he already had the echo She is wondering if he is suppose to have another one?    Please call

## 2018-09-04 NOTE — Addendum Note (Signed)
Addended by: Cristopher Estimable on: 09/04/2018 02:57 PM   Modules accepted: Orders

## 2018-09-04 NOTE — Telephone Encounter (Signed)
Spoke with pt wife, according to the office note the echo should be repeated in one year not now. Appointment cx and order changed.

## 2018-09-05 MED ORDER — PRAVASTATIN SODIUM 80 MG PO TABS: 80.0000 mg | ORAL_TABLET | Freq: Every day | ORAL | 3 refills | Status: DC

## 2018-09-09 ENCOUNTER — Other Ambulatory Visit (HOSPITAL_COMMUNITY): Payer: Medicare Other

## 2018-11-12 ENCOUNTER — Telehealth: Payer: Self-pay | Admitting: Cardiovascular Disease

## 2018-11-12 NOTE — Telephone Encounter (Signed)
Form completed and signed 10/23  Faxed to [place] on 10/28 at [phone]   Returned call. Unable to reach. Left message stating "form faxed and pt can f/u with company" and advised to call back for details if needed

## 2018-11-12 NOTE — Telephone Encounter (Signed)
Wife of the patient called. The patient is applying for assistance through the [employer] for his Eliquis. The wife faxed a Provider form to Dr. Gwenlyn Found on 11/03/18, and the company also faxed a form 11/05/18. The company has not received anything back from our office. The wife would like Dr. Gwenlyn Found to fill those out as soon as possible to complete the application . The wife says Dr. Gwenlyn Found will need to sign the form and send a 1 year script.  Everything on the patient's end and on the company's end is done, and they are just waiting on [name] signature and the rx.  If for any reason Dr. Gwenlyn Found did not get the form, please let the wife know, and she will have the company fax another form to the office.

## 2018-11-18 NOTE — Telephone Encounter (Signed)
Patient assistance has been approved 

## 2018-12-04 ENCOUNTER — Other Ambulatory Visit: Payer: Self-pay | Admitting: Cardiovascular Disease

## 2018-12-17 ENCOUNTER — Telehealth: Payer: Self-pay

## 2018-12-17 ENCOUNTER — Other Ambulatory Visit: Payer: Self-pay

## 2018-12-17 MED ORDER — LOSARTAN POTASSIUM 50 MG PO TABS: 50.0000 mg | ORAL_TABLET | Freq: Every day | ORAL | 3 refills | Status: DC

## 2018-12-17 MED ORDER — DILTIAZEM HCL ER COATED BEADS 240 MG PO CP24: ORAL_CAPSULE | ORAL | 3 refills | Status: DC

## 2018-12-17 NOTE — Telephone Encounter (Signed)
Called pt about Eliquis assistance paperwork. Pt wife asked for paperwork to be mailed so they can send it to [facility] once they have their information. Sent paperwork in the mail.

## 2019-02-23 ENCOUNTER — Telehealth: Payer: Self-pay | Admitting: Cardiovascular Disease

## 2019-02-23 NOTE — Telephone Encounter (Signed)
Patient's wife, Arville Go, is calling to request a prior authorization from Dr. Gwenlyn Found in order for the patient to receive digoxin (LANOXIN) 0.25 MG tablet [id number]  medication. Please contact [place] at [phone] for authorization and return call to patient to confirm.

## 2019-03-06 ENCOUNTER — Telehealth: Payer: Self-pay | Admitting: Cardiovascular Disease

## 2019-03-06 NOTE — Telephone Encounter (Signed)
   [facility] Pre-operative Risk Assessment    Request for surgical clearance:  1. What type of surgery is being performed? Astrology  Bladder removal  2. When is this surgery scheduled? 03/09/19  3. What type of clearance is required (medical clearance vs. Pharmacy clearance to hold med vs. Both) Both  4. Are there any medications that need to be held prior to surgery and how long? Elquis  Practice name and name of physician performing surgery? [facility] 5. What is your office phone number [phone]   7.   What is your office fax number [phone]  8.   Anesthesia type (None, local, MAC, general) ? General   William Preston 03/06/2019, 8:30 AM  _________________________________________________________________   (provider comments below)

## 2019-03-06 NOTE — Telephone Encounter (Signed)
   Primary Cardiologist: Quay Burow, MD  Chart reviewed as part of pre-operative protocol coverage. Patient was contacted 03/06/2019 in reference to pre-operative risk assessment for pending surgery as outlined below.  William Preston was last seen on 09/02/2018 by Dr. Gwenlyn Found.  Since that day, William Preston has done well from a cardiac standpoint. He has been walking 1.5 miles several days per week, including some hills on his route. He denies any chest pain or SOB with that activity. He can easily complete 4 METs without anginal complaints.  Therefore, based on ACC/AHA guidelines, the patient would be at acceptable risk for the planned procedure without further cardiovascular testing.   Per pharmacy recommendations, he can hold eliquis 2 days prior to his upcoming bladder surgery. Eliquis should be restarted when he is cleared to do so by his surgeon.  I will route this recommendation to the requesting party via Epic fax function and remove from pre-op pool.  Please call with questions.  Abigail Butts, PA-C 03/06/2019, 3:21 PM

## 2019-03-06 NOTE — Telephone Encounter (Signed)
Patient with diagnosis of afib on Eliquis for anticoagulation.    Procedure: Astrology  Bladder removal Date of procedure: 03/09/19  CHADS2-VASc score of  3 (HTN, AGE, DM2)  Normal renal function  Per office protocol, patient can hold Eliquis for 2 days prior to procedure.

## 2019-03-12 NOTE — Telephone Encounter (Signed)
Magda Paganini from Rx prior Josem Kaufmann is calling to check on the status of prior authorization of medication due to still not receiving it. Please advise.

## 2019-03-30 ENCOUNTER — Telehealth: Payer: Self-pay | Admitting: Cardiovascular Disease

## 2019-03-30 NOTE — Telephone Encounter (Signed)
New message   Patient needs prior authorization for   digoxin (LANOXIN) 0.25 MG tablet   Sent to [employer] the fax # is [phone]. The phone # is [phone].  Please call to discuss.

## 2019-04-01 NOTE — Telephone Encounter (Signed)
Prior authorization sent to [place] for Lanoxin.Prior auth approved Key # [id number] Case # [id number].Patient's wife notified of approval.

## 2019-04-02 NOTE — Telephone Encounter (Signed)
Digoxin has been approved

## 2019-07-22 ENCOUNTER — Other Ambulatory Visit: Payer: Self-pay | Admitting: Cardiovascular Disease

## 2019-09-03 ENCOUNTER — Ambulatory Visit (HOSPITAL_COMMUNITY): Payer: Medicare Other | Attending: Cardiology

## 2019-09-03 ENCOUNTER — Other Ambulatory Visit: Payer: Self-pay

## 2019-09-03 DIAGNOSIS — I350 Nonrheumatic aortic (valve) stenosis: Secondary | ICD-10-CM | POA: Diagnosis present

## 2019-09-03 LAB — ECHOCARDIOGRAM COMPLETE
AR max vel: 1.17 cm2
AV Area VTI: 1.29 cm2
AV Area mean vel: 1.08 cm2
AV Mean grad: 17.3 mmHg
AV Peak grad: 30.7 mmHg
Ao pk vel: 2.77 m/s
Area-P 1/2: 4.46 cm2
MV M vel: 5.08 m/s
MV Peak grad: 103.2 mmHg
Radius: 0.7 cm
S' Lateral: 3.33 cm

## 2019-09-08 ENCOUNTER — Encounter: Payer: Self-pay | Admitting: Cardiovascular Disease

## 2019-09-08 ENCOUNTER — Ambulatory Visit (INDEPENDENT_AMBULATORY_CARE_PROVIDER_SITE_OTHER): Payer: Medicare Other | Admitting: Cardiovascular Disease

## 2019-09-08 ENCOUNTER — Other Ambulatory Visit: Payer: Self-pay

## 2019-09-08 VITALS — BP 126/84 | HR 86 | Ht 69.0 in | Wt 203.6 lb

## 2019-09-08 DIAGNOSIS — I350 Nonrheumatic aortic (valve) stenosis: Secondary | ICD-10-CM

## 2019-09-08 DIAGNOSIS — E785 Hyperlipidemia, unspecified: Secondary | ICD-10-CM | POA: Diagnosis not present

## 2019-09-08 DIAGNOSIS — G4733 Obstructive sleep apnea (adult) (pediatric): Secondary | ICD-10-CM | POA: Diagnosis not present

## 2019-09-08 DIAGNOSIS — I482 Chronic atrial fibrillation, unspecified: Secondary | ICD-10-CM | POA: Diagnosis not present

## 2019-09-08 LAB — HEPATIC FUNCTION PANEL
ALT: 20 IU/L (ref 0–44)
AST: 21 IU/L (ref 0–40)
Albumin: 4.3 g/dL (ref 3.7–4.7)
Alkaline Phosphatase: 52 IU/L (ref 48–121)
Bilirubin Total: 0.5 mg/dL (ref 0.0–1.2)
Bilirubin, Direct: 0.18 mg/dL (ref 0.00–0.40)
Total Protein: 6.7 g/dL (ref 6.0–8.5)

## 2019-09-08 LAB — LIPID PANEL
Chol/HDL Ratio: 4.2 ratio (ref 0.0–5.0)
Cholesterol, Total: 147 mg/dL (ref 100–199)
HDL: 35 mg/dL — ABNORMAL LOW (ref >39)
LDL Chol Calc (NIH): 73 mg/dL (ref 0–99)
Triglycerides: 238 mg/dL — ABNORMAL HIGH (ref 0–149)
VLDL Cholesterol Cal: 39 mg/dL (ref 5–40)

## 2019-09-08 NOTE — Assessment & Plan Note (Signed)
History of essential hypertension a blood pressure measured today 126/84.  He is on diltiazem, losartan and metoprolol.

## 2019-09-08 NOTE — Assessment & Plan Note (Signed)
History of obstructive sleep apnea on CPAP. 

## 2019-09-08 NOTE — Assessment & Plan Note (Signed)
History of chronic A. fib rate controlled on Eliquis oral anticoagulation 

## 2019-09-08 NOTE — Patient Instructions (Signed)
Medication Instructions:  Your physician recommends that you continue on your current medications as directed. Please refer to the Current Medication list given to you today.  *If you need a refill on your cardiac medications before your next appointment, please call your pharmacy*   Lab Work: Lipid/Hepatic today  If you have labs (blood work) drawn today and your tests are completely normal, you will receive your results only by: Marland Kitchen MyChart Message (if you have MyChart) OR . A paper copy in the mail If you have any lab test that is abnormal or we need to change your treatment, we will call you to review the results.  Follow-Up: At [facility], you and your health needs are our priority.  As part of our continuing mission to provide you with exceptional heart care, we have created designated Provider Care Teams.  These Care Teams include your primary Cardiologist (physician) and Advanced Practice Providers (APPs -  Physician Assistants and Nurse Practitioners) who all work together to provide you with the care you need, when you need it.  We recommend signing up for the patient portal called "MyChart".  Sign up information is provided on this After Visit Summary.  MyChart is used to connect with patients for Virtual Visits (Telemedicine).  Patients are able to view lab/test results, encounter notes, upcoming appointments, etc.  Non-urgent messages can be sent to your provider as well.   To learn more about what you can do with MyChart, go to [email or web address].    Your next appointment:   12 month(s)  The format for your next appointment:   In Person  Provider:   Quay Burow, MD

## 2019-09-08 NOTE — Progress Notes (Signed)
09/08/2019 William Preston   09/23/1950  [id number]  Primary Physician William Shad, MD Primary Cardiologist: William Harp MD William Preston, [place]  HPI:  William Preston is a 69 y.o.  moderately-overweight married Caucasian male, father of 62, whom I last saw in the office  09/02/2018. He has a history of chronic AFib, rate controlled, on Coumadin anticoagulation with INRs in the 2 range followed here in the office by William Preston. He has had 2 failed attempts at cardioversion in the past. He has normal coronary arteries and normal LV function by catheterization performed by me May 25, 2008. His other problems include obstructive sleep apnea and on CPAP, hypertension, hyperlipidemia, and type 2 diabetes. He denies chest pain or shortness of breath. Since I seen him a year ago he's remained currently stable. [place] bladder stone surgically removed whichwent without complication. He did stop his Coumadin 5 days prior. Since I saw him last he's remained currently stable.He and his wife had a 3-week trip to the [place] in 2018, [place] and [place] and are planning a trip to the [employer] [place] in 2019.Marland Kitchen    He is planning a trip to [place] this coming fall..  Since I saw him a year ago he is done well.  He did have COVID-19 twice, once in March once on July 29 of this year.  He did have some headache and some myalgias.  He did transition to Eliquis from Coumadin.  He otherwise denies chest pain or shortness of breath.  Current Meds  Medication Sig  . apixaban (ELIQUIS) 5 MG TABS tablet Take 1 tablet (5 mg total) by mouth 2 (two) times daily.  . Ascorbic Acid (VITAMIN C) 1000 MG tablet Take 1,000 mg by mouth daily.  . cholecalciferol (VITAMIN D) 1000 units tablet Take 1,000 Units by mouth daily.  . digoxin (LANOXIN) 0.25 MG tablet TAKE 1/2 TABLET BY MOUTH EVERY DAY  . diltiazem (CARDIZEM CD) 240 MG 24 hr capsule TAKE 1 CAPSULE BY MOUTH EVERY DAY  . diltiazem  (CARDIZEM CD) 240 MG 24 hr capsule TAKE 1 CAPSULE BY MOUTH EVERY DAY  . doxazosin (CARDURA) 2 MG tablet Take 2 mg by mouth daily.  . finasteride (PROSCAR) 5 MG tablet Take 5 mg by mouth daily.  . furosemide (LASIX) 40 MG tablet TAKE 1/2 TABLET BY MOUTH EVERY DAY AS NEEDED  . glyBURIDE-metformin (GLUCOVANCE) 2.5-500 MG per tablet Take 1 tablet by mouth 3 (three) times daily with meals.   Marland Kitchen losartan (COZAAR) 50 MG tablet Take 1 tablet (50 mg total) by mouth daily.  . metoprolol tartrate (LOPRESSOR) 25 MG tablet Take 1 tablet (25 mg total) by mouth daily.  . Multiple Vitamins-Minerals (MULTIVITAMIN WITH MINERALS) tablet Take 1 tablet by mouth daily.  . pravastatin (PRAVACHOL) 80 MG tablet Take 1 tablet (80 mg total) by mouth daily.  . vitamin B-12 (CYANOCOBALAMIN) 1000 MCG tablet Take 1,000 mcg by mouth daily.  . vitamin E 400 UNIT capsule Take 400 Units by mouth daily.     No Known Allergies  Social History   Socioeconomic History  . Marital status: Married    Spouse name: Not on file  . Number of children: Not on file  . Years of education: Not on file  . Highest education level: Not on file  Occupational History  . Occupation: retired    Comment: [employer] in [place] Use  . Smoking status: Never Smoker  . Smokeless  tobacco: Never Used  Vaping Use  . Vaping Use: Never used  Substance and Sexual Activity  . Alcohol use: No  . Drug use: No  . Sexual activity: Not on file  Other Topics Concern  . Not on file  Social History Narrative  . Not on file   Social Determinants of Health   Financial Resource Strain:   . Difficulty of Paying Living Expenses: Not on file  Food Insecurity:   . Worried About Charity fundraiser in the Last Year: Not on file  . Ran Out of Food in the Last Year: Not on file  Transportation Needs:   . Lack of Transportation (Medical): Not on file  . Lack of Transportation (Non-Medical): Not on file  Physical Activity:   . Days of Exercise  per Week: Not on file  . Minutes of Exercise per Session: Not on file  Stress:   . Feeling of Stress : Not on file  Social Connections:   . Frequency of Communication with Friends and Family: Not on file  . Frequency of Social Gatherings with Friends and Family: Not on file  . Attends Religious Services: Not on file  . Active Member of Clubs or Organizations: Not on file  . Attends Archivist Meetings: Not on file  . Marital Status: Not on file  Intimate Partner Violence:   . Fear of Current or Ex-Partner: Not on file  . Emotionally Abused: Not on file  . Physically Abused: Not on file  . Sexually Abused: Not on file     Review of Systems: General: negative for chills, fever, night sweats or weight changes.  Cardiovascular: negative for chest pain, dyspnea on exertion, edema, orthopnea, palpitations, paroxysmal nocturnal dyspnea or shortness of breath Dermatological: negative for rash Respiratory: negative for cough or wheezing Urologic: negative for hematuria Abdominal: negative for nausea, vomiting, diarrhea, bright red blood per rectum, melena, or hematemesis Neurologic: negative for visual changes, syncope, or dizziness All other systems reviewed and are otherwise negative except as noted above.    Blood pressure 126/84, pulse 86, height 5\' 9"  (1.753 m), weight 203 lb 9.6 oz (92.4 kg).  General appearance: alert and no distress Neck: no adenopathy, no carotid bruit, no JVD, supple, symmetrical, trachea midline and thyroid not enlarged, symmetric, no tenderness/mass/nodules Lungs: clear to auscultation bilaterally Heart: irregularly irregular rhythm and Soft outflow tract murmur Extremities: extremities normal, atraumatic, no cyanosis or edema Pulses: 2+ and symmetric Skin: Skin color, texture, turgor normal. No rashes or lesions Neurologic: Alert and oriented X 3, normal strength and tone. Normal symmetric reflexes. Normal coordination and gait  EKG atrial  fibrillation with a ventricular spots of 86 and septal Q waves.  I personally reviewed this EKG.  ASSESSMENT AND PLAN:   OSA- on C-pap History of obstructive sleep apnea on CPAP  HYPERTENSION, BENIGN History of essential hypertension a blood pressure measured today 126/84.  He is on diltiazem, losartan and metoprolol.  Chronic atrial fibrillation History of chronic A. fib rate controlled on Eliquis oral anticoagulation.  Dyslipidemia History of dyslipidemia on statin therapy.  We will recheck a lipid liver profile today.  Mild aortic stenosis History of mild aortic stenosis on 2D echocardiogram performed 09/03/2019.  He does have a soft outflow tract murmur.  We will recheck a 2D echocardiogram in several years to follow.      William Harp MD FACP,FACC,FAHA, [facility] 09/08/2019 9:41 AM

## 2019-09-08 NOTE — Assessment & Plan Note (Signed)
History of dyslipidemia on statin therapy.  We will recheck a lipid liver profile today.

## 2019-09-08 NOTE — Assessment & Plan Note (Signed)
History of mild aortic stenosis on 2D echocardiogram performed 09/03/2019.  He does have a soft outflow tract murmur.  We will recheck a 2D echocardiogram in several years to follow.

## 2019-09-09 ENCOUNTER — Other Ambulatory Visit: Payer: Self-pay | Admitting: Cardiovascular Disease

## 2019-10-04 ENCOUNTER — Other Ambulatory Visit: Payer: Self-pay | Admitting: Cardiovascular Disease

## 2019-12-27 ENCOUNTER — Other Ambulatory Visit: Payer: Self-pay | Admitting: Cardiovascular Disease

## 2020-01-04 ENCOUNTER — Telehealth: Payer: Self-pay

## 2020-01-04 NOTE — Telephone Encounter (Signed)
[employer] pt assistance form completed and faxed for eliquis.

## 2020-01-19 ENCOUNTER — Telehealth: Payer: Self-pay | Admitting: Cardiovascular Disease

## 2020-01-19 NOTE — Telephone Encounter (Signed)
Patient's wife calling to update the patient's pharmacy. She states all future prescriptions need to go to [facility] [address] in [place] [address], Phone: [phone]

## 2020-01-19 NOTE — Telephone Encounter (Signed)
Pharmacy switched per pt's wife.

## 2020-02-09 ENCOUNTER — Telehealth: Payer: Self-pay | Admitting: Cardiovascular Disease

## 2020-02-09 MED ORDER — APIXABAN 5 MG PO TABS: 5.0000 mg | ORAL_TABLET | Freq: Two times a day (BID) | ORAL | 1 refills | Status: DC

## 2020-02-09 NOTE — Telephone Encounter (Signed)
*  STAT* If patient is at the pharmacy, call can be transferred to refill team.   1. Which medications need to be refilled? (please list name of each medication and dose if known) apixaban (ELIQUIS) 5 MG TABS tablet  2. Which pharmacy/location (including street and city if local pharmacy) is medication to be sent to? [place] [address]  3. Do they need a 30 day or 90 day supply?  90 day

## 2020-02-09 NOTE — Telephone Encounter (Signed)
Rx has been sent to the pharmacy electronically. ° °

## 2020-02-25 ENCOUNTER — Other Ambulatory Visit: Payer: Self-pay | Admitting: Cardiovascular Disease

## 2020-02-25 NOTE — Telephone Encounter (Signed)
*  STAT* If patient is at the pharmacy, call can be transferred to refill team.   1. Which medications need to be refilled? (please list name of each medication and dose if known) diltiazem (CARDIZEM CD) 240 MG 24 hr capsule  2. Which pharmacy/location (including street and city if local pharmacy) is medication to be sent to? [place] [address]  3. Do they need a 30 day or 90 day supply? [place]

## 2020-03-01 ENCOUNTER — Telehealth: Payer: Self-pay

## 2020-03-01 NOTE — Telephone Encounter (Signed)
Pt assistance from [facility] completed and faxed.

## 2020-03-03 ENCOUNTER — Other Ambulatory Visit: Payer: Self-pay

## 2020-03-03 MED ORDER — DILTIAZEM HCL ER COATED BEADS 240 MG PO CP24: 240.0000 mg | ORAL_CAPSULE | Freq: Every day | ORAL | 3 refills | Status: DC

## 2020-03-03 NOTE — Progress Notes (Signed)
Pt called requesting prescription for diltiazem to be sent to another pharmacy in [place]. Prescription sent and confirmed.

## 2020-04-15 ENCOUNTER — Telehealth: Payer: Self-pay | Admitting: Cardiovascular Disease

## 2020-04-15 NOTE — Telephone Encounter (Signed)
No contraindications or drug-interaction with cardiac medication. Okay to take as prescribed.

## 2020-04-15 NOTE — Telephone Encounter (Signed)
Fine with me as well.  JJB

## 2020-04-15 NOTE — Telephone Encounter (Signed)
Patient's wife states the patient's podiatrist recommended Gabapentin 300 MG once daily for neuropathy. She would like to know if Dr. Gwenlyn Found agrees with this. Please advise.

## 2020-04-15 NOTE — Telephone Encounter (Signed)
Pt informed of providers result & recommendations. Pt verbalized understanding. No further questions . ? ?

## 2020-06-21 ENCOUNTER — Other Ambulatory Visit: Payer: Self-pay | Admitting: Cardiovascular Disease

## 2020-07-04 ENCOUNTER — Other Ambulatory Visit: Payer: Self-pay | Admitting: Cardiovascular Disease

## 2020-08-31 ENCOUNTER — Encounter: Payer: Self-pay | Admitting: *Deleted

## 2020-09-01 ENCOUNTER — Ambulatory Visit (INDEPENDENT_AMBULATORY_CARE_PROVIDER_SITE_OTHER): Payer: Medicare Other | Admitting: Neurology

## 2020-09-01 ENCOUNTER — Encounter: Payer: Self-pay | Admitting: Neurology

## 2020-09-01 VITALS — BP 99/62 | HR 57 | Ht 69.0 in | Wt 202.6 lb

## 2020-09-01 DIAGNOSIS — R413 Other amnesia: Secondary | ICD-10-CM | POA: Diagnosis not present

## 2020-09-01 DIAGNOSIS — I4811 Longstanding persistent atrial fibrillation: Secondary | ICD-10-CM

## 2020-09-01 DIAGNOSIS — G4733 Obstructive sleep apnea (adult) (pediatric): Secondary | ICD-10-CM

## 2020-09-01 DIAGNOSIS — Z9989 Dependence on other enabling machines and devices: Secondary | ICD-10-CM

## 2020-09-01 DIAGNOSIS — R410 Disorientation, unspecified: Secondary | ICD-10-CM | POA: Diagnosis not present

## 2020-09-01 NOTE — Progress Notes (Signed)
Subjective:    Patient ID: William Preston is a 70 y.o. male.  HPI    Star Age, MD, PhD [facility] [address], [address] [place], [place] [address]  Dear William Preston,  I saw your patient, William Preston, upon your kind request in my neurologic clinic today for initial consultation of his confusion and staring spells.  The patient is accompanied by his wife today.  As you know, Mr. Papillion is a 70 year old right-handed gentleman with an underlying medical history of hypertension, hyperlipidemia, diabetes, atrial fibrillation, obstructive sleep apnea, on CPAP therapy, and mild obesity, who reports difficulty with her short-term memory for the past few years.  Symptoms date back to 2016 or earlier.  He started seeing a neurologist in 2016.  He has been forgetful, mostly short-term difficulty including misplacing things.  He has not had any difficulty driving or navigating.  Wife reports that he is a good driver.  He has not had any recent falls.  Never had any severe headaches or sudden onset of one-sided weakness or numbness or tingling or droopy face or slurring of speech.  He is fully compliant with his CPAP and per wife has done really well with it and is religious with its usage. He tries to hydrate well but could do a little better.  He is a non-smoker, he has not had any alcohol in about 50 years.  As far as exercise, he tries to walk on a regular basis.  He is in regular follow-up with ophthalmology every 6 months, he sees a specialist in [place], [place]. He has no obvious family history of dementia, mom lived to be 99 and father lived to be 47, both had heart disease.  He has 2 sisters and 1 brother.  1 sister is older and 1 is younger, brother is older.  None of them have memory loss as far as he knows.  His wife supplements his history, she reports that at the end of May he had a couple of episodes of confusion, he was zoned out, he would stare into space.  Some  days later, he started having swelling on one of his fingers and was treated for cellulitis with a 10-day course of clindamycin.  He had a similar cellulitis type change in his right toe afterwards.  He has been seeing a podiatrist.  His toe is healing. Wife reports that he has had diabetic neuropathy for years.  He has had hearing aids.  He is retired, he worked as a Designer, jewellery.  They have 3 children, 1 son lives in [place], 1 in [place], and daughter lives in [place].  I reviewed your office note from 06/22/2020.  He reported episodes of zoning out and staring.  He reported difficulty with word finding.  He also reported dizziness when standing.  Of note, he is on multiple medications including Cardizem, digoxin, Cozaar, furosemide, metoprolol, doxazosin, Eliquis, pravastatin, potassium, finasteride, glyburide. He had a remote head CT without contrast on 07/17/2005 and I reviewed the results: Impression: Normal head CT.  He had a recent brain MRI.  He had a brain MRI without contrast through [facility] on 07/06/2020 and I reviewed the results: Impression: No acute abnormalities.  Sinus disease right maxillary sinus.  Involutional changes. (In the body of the report there is mentioning of age-appropriate global atrophy.)  In the office on 06/22/2020 his sitting blood pressure was 110/82, standing 100/80.  He has recently seen a neurologist, prior  neurology records are not available for my review today.  We will request office records, he is willing to sign a release of information form.  He reports that he has been treated with supplements including over-the-counter vitamin D and vitamin B12. He requested a second opinion. He had blood work through your office on 04/06/2020 and I was able to review the results, CMP showed glucose of 109, BUN 16, creatinine 0.92, alk phos 56, AST 24, ALT 25.  Cholesterol panel showed total cholesterol of 133, triglycerides 193, HDL 34,  LDL 67.  A1c was 6.5,  His Past Medical History Is Significant For: Past Medical History:  Diagnosis Date   Chronic atrial fibrillation ([place])    DM (diabetes mellitus) ([place])    Hyperlipidemia    Hypertension    Memory loss    OSA on CPAP     His Past Surgical History Is Significant For: Past Surgical History:  Procedure Laterality Date   CARDIAC CATHETERIZATION  11 2005   normal   CARDIOVERSION  05/25/2008   successful   COLONOSCOPY N/A 06/20/2017   Procedure: COLONOSCOPY;  Surgeon: Danie Binder, MD;  [facility];  Service: Endoscopy;  Laterality: N/A;  12:15pm   NM MYOCAR PERF WALL MOTION  10/29/2007   low risk   POLYPECTOMY  06/20/2017   Procedure: POLYPECTOMY;  Surgeon: Danie Binder, MD;  Location: [facility];  Service: Endoscopy;;  ascending colon x3, transverse colon polyp (cb)   ruptured spleen     age 30, fell off a tractor that pushed him across a field   SPLENECTOMY, TOTAL  03/6642   UMBILICAL HERNIA REPAIR     US ECHOCARDIOGRAPHY  11/19/2008   LA mod-severely dilated, RA mildly dilated,mild MR,mild to mod TR,trace AI    His Family History Is Significant For: Family History  Problem Relation Age of Onset   Heart failure Mother    Heart failure Father    Hyperlipidemia Brother    Hyperlipidemia Sister    Colon cancer Neg Hx    Colon polyps Neg Hx     His Social History Is Significant For: Social History   Socioeconomic History   Marital status: Married    Spouse name: Not on file   Number of children: Not on file   Years of education: Not on file   Highest education level: Not on file  Occupational History   Occupation: retired    Comment: [employer] in [place]  Tobacco Use   Smoking status: Never   Smokeless tobacco: Never  Vaping Use   Vaping Use: Never used  Substance and Sexual Activity   Alcohol use: No   Drug use: No   Sexual activity: Not on file  Other Topics Concern   Not on file  Social History Narrative   Not on  file   Social Determinants of Health   Financial Resource Strain: Not on file  Food Insecurity: Not on file  Transportation Needs: Not on file  Physical Activity: Not on file  Stress: Not on file  Social Connections: Not on file    His Allergies Are:  No Known Allergies:   His Current Medications Are:  Outpatient Encounter Medications as of 09/01/2020  Medication Sig   apixaban (ELIQUIS) 5 MG TABS tablet Take 1 tablet (5 mg total) by mouth 2 (two) times daily.   Ascorbic Acid (VITAMIN C) 1000 MG tablet Take 1,000 mg by mouth daily.   cholecalciferol (VITAMIN D) 1000 units tablet Take  1,000 Units by mouth daily.   digoxin (LANOXIN) 0.25 MG tablet TAKE 1/2 TABLET BY MOUTH EVERY DAY   diltiazem (CARDIZEM CD) 240 MG 24 hr capsule TAKE 1 CAPSULE BY MOUTH EVERY DAY   diltiazem (CARDIZEM CD) 240 MG 24 hr capsule Take 1 capsule (240 mg total) by mouth daily.   doxazosin (CARDURA) 2 MG tablet Take 4 mg by mouth daily.   finasteride (PROSCAR) 5 MG tablet Take 5 mg by mouth daily.   furosemide (LASIX) 40 MG tablet TAKE 1/2 TABLET BY MOUTH EVERY DAY AS NEEDED   glyBURIDE-metformin (GLUCOVANCE) 2.5-500 MG per tablet Take 1 tablet by mouth 3 (three) times daily with meals.    losartan (COZAAR) 50 MG tablet TAKE 1 TABLET BY MOUTH EVERY DAY   metoprolol tartrate (LOPRESSOR) 25 MG tablet TAKE 1 TABLET BY MOUTH EVERY DAY   Multiple Vitamins-Minerals (MULTIVITAMIN WITH MINERALS) tablet Take 1 tablet by mouth daily.   pravastatin (PRAVACHOL) 80 MG tablet Take 1 tablet by mouth once daily   vitamin B-12 (CYANOCOBALAMIN) 1000 MCG tablet Take 1,000 mcg by mouth daily.   vitamin E 400 UNIT capsule Take 400 Units by mouth daily.   No facility-administered encounter medications on file as of 09/01/2020.  :   Review of Systems:  Out of a complete 14 point review of systems, all are reviewed and negative with the exception of these symptoms as listed below:   Review of Systems  Neurological:        Pt  is here for increased confusion and short time memory has worsen. Pt wife states they have seen another neurologist in [place] but was not pleased with care.    Objective:  Neurological Exam  Physical Exam Physical Examination:   Vitals:   09/01/20 1000  BP: 99/62  Pulse: (!) 57   General Examination: The patient is a very pleasant 70 y.o. male in no acute distress. He appears well-developed and well-nourished and well groomed.   HEENT: Normocephalic, atraumatic, pupils are equal, round and reactive to light and accommodation.  He is status post bilateral cataract repairs, tracking is well-preserved, no nystagmus, hearing is mildly impaired, he has bilateral hearing aids.  Face is symmetric with normal facial animation, normal facial sensation to light touch, temperature and vibration.  Airway examination reveals mild mouth dryness, tongue protrudes centrally and palate elevates symmetrically, mild airway crowding noted.  No carotid bruits, neck is supple with full range of motion.    Chest: Clear to auscultation without wheezing, rhonchi or crackles noted.  Heart: S1+S2+0, mildly irregular, 3 out of 6 systolic murmur noted.    Abdomen: Soft, non-tender and non-distended.  Extremities: There is trace pitting edema in the distal lower extremities bilaterally, left a little more noticeable than right. Pedal pulses are intact.  Skin: Warm and dry with mild reddish discoloration noted in both feet, spider veins left ankle, healing lesion second and third toe on the right.    Musculoskeletal: exam reveals no obvious joint deformities with the exception of mild left knee tenderness and swelling.  No redness.  Neurologically:  Mental status: The patient is awake, alert and oriented in all 4 spheres. His immediate and remote memory, attention, language skills and fund of knowledge are appropriate. There is no evidence of aphasia, agnosia, apraxia or anomia. Speech is clear with normal  prosody and enunciation. Thought process is linear. Mood is normal and affect is normal.   MMSE - Mini Mental State Exam 09/01/2020  Orientation to  time 5  Orientation to Place 5  Registration 3  Attention/ Calculation 4  Recall 3  Language- name 2 objects 2  Language- repeat 1  Language- follow 3 step command 1  Language- read & follow direction 1  Write a sentence 1  Copy design 0  Total score 26   On 09/01/2020: CDT: 4/4, AFT: 16/min.     Cranial nerves II - XII are as described above under HEENT exam. In addition: shoulder shrug is normal with equal shoulder height noted. Motor exam: Normal bulk, strength and tone is noted. There is no drift, tremor or rebound.  Reflexes are 1+ in the upper extremities and trace in the knees, absent in the ankles. Babinski: Toes are flexor bilaterally. Fine motor skills and coordination: intact with normal finger taps, normal hand movements, normal rapid alternating patting, normal foot taps and normal foot agility.  Cerebellar testing: No dysmetria or intention tremor on finger to nose testing. Heel to shin is unremarkable bilaterally. There is no truncal or gait ataxia.  Sensory exam: intact to light touch, vibration, temperature sense in the upper extremities but decreased to temperature and vibration up to the ankles bilaterally.   Gait, station and balance: He stands slowly, he walks slightly wider base, he walks slowly, no walking aid, no shuffling, has preserved arm swing.    Assessment and Plan:   In summary, MISHON BLUBAUGH is a very pleasant 70 y.o.-year old male with an underlying medical history of hypertension, hyperlipidemia, diabetes, atrial fibrillation, obstructive sleep apnea, on CPAP therapy, and mild obesity, who presents for evaluation of his memory loss of approximately 6 years duration.  He reports difficulty with short-term memory, particularly forgetfulness and sometimes misplacing things.  In May 2022 he had a spell of a few  days where he was more confused, his wife felt that he was not as alert and would stare into space.  No convulsions were noted, no abnormal twitching.  Incidentally, he was found to have cellulitis and was treated with antibiotics.  He improved and did not have any recurrent spells like this.  He has recently undergone a brain MRI without contrast with age-appropriate findings which is rather favorable.  He does not have a telltale or strong family history of dementia thankfully.  He does have vascular risk factors.  We talked about his complex medical history quite a bit today.  Sleep apnea is on treatment.  He is working on lifestyle modification, maintaining good diabetes control.  In fact, his wife reports that 7 years ago his A1c was around 12.  He has s Memory scores are only mildly abnormal.  We talked about his risk factors, we talked about the importance of good hydration, good nutrition, especially regular exercise.  He is going to work on maintaining more physical activity.  He is in regular follow-up with his cardiologist and ophthalmologist and with your office.  We will go ahead and do some additional blood work today.  We will request office notes from his neurologist.  I suggested we proceed with an EEG and call him with his blood test results and EEG results.  I talked to him about pursuing more in-depth memory evaluation with the help of a neuropsychologist.  They would like to think about it.  I explained this consultation to them in detail.  They can always call us back if they decide to pursue this.  For now, we can notify them by phone call of his test results and  I suggested he follow with your office as scheduled.  I would be happy to see him back as needed.  I answered all the questions today and the patient and his wife were in agreement with this approach.   Thank you very much for allowing me to participate in the care of this nice patient. If I can be of any further assistance to you  please do not hesitate to call me at [phone].  Sincerely,   Star Age, MD, PhD

## 2020-09-01 NOTE — Patient Instructions (Addendum)
You have complaints of memory loss: memory loss or changes in cognitive function can have many reasons and does not always mean you have dementia. Conditions that can contribute to subjective or objective memory loss include: depression, stress, poor sleep from insomnia or sleep apnea, dehydration, fluctuation in blood sugar values, thyroid or electrolyte dysfunction and certain vitamin deficiencies. Dementia can be caused by stroke, brain atherosclerosis or brain vascular disease due to vascular risk factors (smoking, high blood pressure, high cholesterol, obesity and uncontrolled diabetes), certain degenerative brain disorders (including Parkinson's disease and Multiple sclerosis) and by Alzheimer's disease or other, more rare and sometimes hereditary causes.   We will do some additional testing: blood work (which we will do today).   Your recent brain MRI was reassuring.   We will do an EEG (brainwave test), which we will schedule. We will call you with the results.  We do not have to start any new medication.   I would like to consider a formal cognitive test called neuropsychological evaluation which is done by a licensed neuropsychologist.  I can make a referral in that regard.  Please let me know if you would like to proceed with this.  For now, we will call you with brainwave test results and blood test results.  Your memory loss is rather mild at this point, which, of course is reassuring.   We can follow-up as needed.

## 2020-09-05 ENCOUNTER — Telehealth: Payer: Self-pay | Admitting: *Deleted

## 2020-09-05 ENCOUNTER — Telehealth: Payer: Self-pay | Admitting: Neurology

## 2020-09-05 LAB — TSH: TSH: 3.32 u[IU]/mL (ref 0.450–4.500)

## 2020-09-05 LAB — COMPREHENSIVE METABOLIC PANEL
ALT: 18 IU/L (ref 0–44)
AST: 17 IU/L (ref 0–40)
Albumin/Globulin Ratio: 2.2 (ref 1.2–2.2)
Albumin: 4.4 g/dL (ref 3.7–4.7)
Alkaline Phosphatase: 51 IU/L (ref 44–121)
BUN/Creatinine Ratio: 24 (ref 10–24)
BUN: 20 mg/dL (ref 8–27)
Bilirubin Total: 0.6 mg/dL (ref 0.0–1.2)
CO2: 21 mmol/L (ref 20–29)
Calcium: 9.7 mg/dL (ref 8.6–10.2)
Chloride: 103 mmol/L (ref 96–106)
Creatinine, Ser: 0.84 mg/dL (ref 0.76–1.27)
Globulin, Total: 2 g/dL (ref 1.5–4.5)
Glucose: 108 mg/dL — ABNORMAL HIGH (ref 65–99)
Potassium: 4.5 mmol/L (ref 3.5–5.2)
Sodium: 139 mmol/L (ref 134–144)
Total Protein: 6.4 g/dL (ref 6.0–8.5)
eGFR: 93 mL/min/{1.73_m2} (ref >59)

## 2020-09-05 LAB — B12 AND FOLATE PANEL
Folate: >20 ng/mL (ref >3.0)
Vitamin B-12: 1219 pg/mL (ref 232–1245)

## 2020-09-05 LAB — VITAMIN B1: Thiamine: 201.3 nmol/L — ABNORMAL HIGH (ref 66.5–200.0)

## 2020-09-05 LAB — RPR: RPR Ser Ql: NONREACTIVE

## 2020-09-05 LAB — HGB A1C W/O EAG: Hgb A1c MFr Bld: 6.6 % — ABNORMAL HIGH (ref 4.8–5.6)

## 2020-09-05 NOTE — Telephone Encounter (Signed)
Spoke with the patient who placed the phone on speaker phone so his wife could listen and.  Advised that so far his labs are good including thyroid screening, vitamin B12, A1c was below 7 at 6.6.  Advised that vitamin B1 level is pending and we will update him if it comes back abnormal.  He verbalized understanding and appreciation.  His EEG is next week on 8/31.  I let him know we will call him with Results.

## 2020-09-05 NOTE — Telephone Encounter (Signed)
EEG order sent to [facility], they will reach out to pt to schedule. 

## 2020-09-05 NOTE — Telephone Encounter (Signed)
-----   Message from Star Age, MD sent at 09/02/2020 12:57 PM EDT ----- Please advise patient or his wife that labs look good, including Thyroid screening, vit B12.  A1c was below 7 at 6.6. vit B1 level pending, we will update, if it comes back abn.

## 2020-09-07 ENCOUNTER — Ambulatory Visit (INDEPENDENT_AMBULATORY_CARE_PROVIDER_SITE_OTHER): Payer: Medicare Other | Admitting: Cardiovascular Disease

## 2020-09-07 ENCOUNTER — Other Ambulatory Visit: Payer: Self-pay

## 2020-09-07 ENCOUNTER — Encounter: Payer: Self-pay | Admitting: Cardiovascular Disease

## 2020-09-07 VITALS — BP 130/83 | HR 102 | Ht 70.0 in | Wt 202.2 lb

## 2020-09-07 DIAGNOSIS — E785 Hyperlipidemia, unspecified: Secondary | ICD-10-CM

## 2020-09-07 DIAGNOSIS — I350 Nonrheumatic aortic (valve) stenosis: Secondary | ICD-10-CM

## 2020-09-07 DIAGNOSIS — I482 Chronic atrial fibrillation, unspecified: Secondary | ICD-10-CM | POA: Diagnosis not present

## 2020-09-07 MED ORDER — PRAVASTATIN SODIUM 80 MG PO TABS: 80.0000 mg | ORAL_TABLET | Freq: Every day | ORAL | 0 refills | Status: DC

## 2020-09-07 MED ORDER — LOSARTAN POTASSIUM 50 MG PO TABS: 50.0000 mg | ORAL_TABLET | Freq: Every day | ORAL | 3 refills | Status: DC

## 2020-09-07 MED ORDER — FUROSEMIDE 40 MG PO TABS: ORAL_TABLET | ORAL | 2 refills | Status: DC

## 2020-09-07 MED ORDER — METOPROLOL TARTRATE 25 MG PO TABS
25.0000 mg | ORAL_TABLET | Freq: Every day | ORAL | 3 refills | Status: DC
Start: 2020-09-07 — End: 2021-08-23

## 2020-09-07 NOTE — Assessment & Plan Note (Signed)
History of dyslipidemia on statin therapy with lipid profile performed 09/08/2019 revealing total cholesterol 147, LDL 73 and HDL 35.

## 2020-09-07 NOTE — Assessment & Plan Note (Signed)
History of essential hypertension blood pressure measured at 130/83.  He is on diltiazem, losartan and metoprolol.

## 2020-09-07 NOTE — Patient Instructions (Signed)
Medication Instructions:  The current medical regimen is effective;  continue present plan and medications.  *If you need a refill on your cardiac medications before your next appointment, please call your pharmacy*  Testing:  Echocardiogram (in one year before follow up) - Your physician has requested that you have an echocardiogram. Echocardiography is a painless test that uses sound waves to create images of your heart. It provides your doctor with information about the size and shape of your heart and how well your heart's chambers and valves are working. This procedure takes approximately one hour. There are no restrictions for this procedure. This will be performed at our [facility] location - [address], [address].   Follow-Up: At [facility], you and your health needs are our priority.  As part of our continuing mission to provide you with exceptional heart care, we have created designated Provider Care Teams.  These Care Teams include your primary Cardiologist (physician) and Advanced Practice Providers (APPs -  Physician Assistants and Nurse Practitioners) who all work together to provide you with the care you need, when you need it.  We recommend signing up for the patient portal called "MyChart".  Sign up information is provided on this After Visit Summary.  MyChart is used to connect with patients for Virtual Visits (Telemedicine).  Patients are able to view lab/test results, encounter notes, upcoming appointments, etc.  Non-urgent messages can be sent to your provider as well.   To learn more about what you can do with MyChart, go to [email or web address].    Your next appointment:   12 month(s)  The format for your next appointment:   In Person  Provider:   Quay Burow, MD

## 2020-09-07 NOTE — Progress Notes (Signed)
09/07/2020 [name]   1950-11-26  [id number]  Primary Physician Andres Shad, MD Primary Cardiologist: Lorretta Harp MD Lupe Carney, [place]  HPI:  William Preston is a 70 y.o.  moderately-overweight married Caucasian male, father of 63, whom I last saw in the office    09/08/2019. He has a history of chronic AFib, rate controlled, on Coumadin anticoagulation with INRs in the 2 range followed here in the office by Erasmo Downer. He has had 2 failed attempts at cardioversion in the past. He has normal coronary arteries and normal LV function by catheterization performed by me May 25, 2008. His other problems include obstructive sleep apnea and on CPAP, hypertension, hyperlipidemia, and type 2 diabetes. He denies chest pain or shortness of breath. Since I seen him a year ago he's remained currently stable. He had a bladder stone surgically removed which went without complication. He did stop his Coumadin 5 days prior. Since I saw him last he's remained currently stable.  He and his wife had a 3-week trip to the [place] in 2018, [place] and [place] and are planning a trip to the [employer] [place] in 2019.[place] Kitchen    He is planning a trip to [place] this coming fall.Marland Kitchen   He did have COVID-19 twice, once in March once on August 13 2019 he did have some headache and some myalgias.  He did transition to Eliquis from Coumadin.  He otherwise denies chest pain or shortness of breath.  Since I saw him a year ago he is done well.  He is asymptomatic.  He is planning his trip to [place] next month.   Current Meds  Medication Sig   apixaban (ELIQUIS) 5 MG TABS tablet Take 1 tablet (5 mg total) by mouth 2 (two) times daily.   Ascorbic Acid (VITAMIN C) 1000 MG tablet Take 1,000 mg by mouth daily.   cholecalciferol (VITAMIN D) 1000 units tablet Take 1,000 Units by mouth daily.   digoxin (LANOXIN) 0.25 MG tablet TAKE 1/2 TABLET BY MOUTH EVERY DAY   diltiazem (CARDIZEM CD) 240 MG  24 hr capsule TAKE 1 CAPSULE BY MOUTH EVERY DAY   diltiazem (CARDIZEM CD) 240 MG 24 hr capsule Take 1 capsule (240 mg total) by mouth daily.   doxazosin (CARDURA) 2 MG tablet Take 4 mg by mouth daily.   finasteride (PROSCAR) 5 MG tablet Take 5 mg by mouth daily.   glyBURIDE-metformin (GLUCOVANCE) 2.5-500 MG per tablet Take 1 tablet by mouth 3 (three) times daily with meals.    Multiple Vitamins-Minerals (MULTIVITAMIN WITH MINERALS) tablet Take 1 tablet by mouth daily.   vitamin B-12 (CYANOCOBALAMIN) 1000 MCG tablet Take 1,000 mcg by mouth daily.   vitamin E 400 UNIT capsule Take 400 Units by mouth daily.   [DISCONTINUED] furosemide (LASIX) 40 MG tablet TAKE 1/2 TABLET BY MOUTH EVERY DAY AS NEEDED   [DISCONTINUED] losartan (COZAAR) 50 MG tablet TAKE 1 TABLET BY MOUTH EVERY DAY   [DISCONTINUED] metoprolol tartrate (LOPRESSOR) 25 MG tablet TAKE 1 TABLET BY MOUTH EVERY DAY   [DISCONTINUED] pravastatin (PRAVACHOL) 80 MG tablet Take 1 tablet by mouth once daily     No Known Allergies  Social History   Socioeconomic History   Marital status: Married    Spouse name: Not on file   Number of children: Not on file   Years of education: Not on file   Highest education level: Not on file  Occupational History  Occupation: retired    Comment: [employer] in [place]  Tobacco Use   Smoking status: Never   Smokeless tobacco: Never  Vaping Use   Vaping Use: Never used  Substance and Sexual Activity   Alcohol use: No   Drug use: No   Sexual activity: Not on file  Other Topics Concern   Not on file  Social History Narrative   Not on file   Social Determinants of Health   Financial Resource Strain: Not on file  Food Insecurity: Not on file  Transportation Needs: Not on file  Physical Activity: Not on file  Stress: Not on file  Social Connections: Not on file  Intimate Partner Violence: Not on file     Review of Systems: General: negative for chills, fever, night sweats or weight  changes.  Cardiovascular: negative for chest pain, dyspnea on exertion, edema, orthopnea, palpitations, paroxysmal nocturnal dyspnea or shortness of breath Dermatological: negative for rash Respiratory: negative for cough or wheezing Urologic: negative for hematuria Abdominal: negative for nausea, vomiting, diarrhea, bright red blood per rectum, melena, or hematemesis Neurologic: negative for visual changes, syncope, or dizziness All other systems reviewed and are otherwise negative except as noted above.    Blood pressure 130/83, pulse (!) 102, height '5\' 10"'$  (1.778 m), weight 202 lb 3.2 oz (91.7 kg), SpO2 94 %.  General appearance: alert and no distress Neck: no adenopathy, no carotid bruit, no JVD, supple, symmetrical, trachea midline, and thyroid not enlarged, symmetric, no tenderness/mass/nodules Lungs: clear to auscultation bilaterally Heart: Irregularly irregular rhythm with a 2/6 systolic ejection murmur at the base consistent with aortic stenosis. Extremities: 1+ peripheral edema bilaterally Pulses: 2+ and symmetric Skin: Skin color, texture, turgor normal. No rashes or lesions Neurologic: Grossly normal  EKG atrial fibrillation with a ventricular sponsor 102 and evidence of LVH.  I personally reviewed this EKG.  ASSESSMENT AND PLAN:   HYPERTENSION, BENIGN History of essential hypertension blood pressure measured at 130/83.  He is on diltiazem, losartan and metoprolol.  Chronic atrial fibrillation History of chronic A. fib rate controlled on Eliquis oral anticoagulation.  Dyslipidemia History of dyslipidemia on statin therapy with lipid profile performed 09/08/2019 revealing total cholesterol 147, LDL 73 and HDL 35.  Mild aortic stenosis History of aortic stenosis with a valve area of 1.3 cm and a peak gradient of 30 mmHg by 2D echo performed 09/03/2019.  This will be repeated in 1 year.     Lorretta Harp MD FACP,FACC,FAHA, [facility] 09/07/2020 3:37 PM

## 2020-09-07 NOTE — Assessment & Plan Note (Signed)
History of chronic A. fib rate controlled on Eliquis oral anticoagulation 

## 2020-09-07 NOTE — Assessment & Plan Note (Signed)
History of aortic stenosis with a valve area of 1.3 cm and a peak gradient of 30 mmHg by 2D echo performed 09/03/2019.  This will be repeated in 1 year.

## 2020-09-14 ENCOUNTER — Telehealth: Payer: Self-pay | Admitting: *Deleted

## 2020-09-14 ENCOUNTER — Ambulatory Visit (INDEPENDENT_AMBULATORY_CARE_PROVIDER_SITE_OTHER): Payer: Medicare Other

## 2020-09-14 ENCOUNTER — Other Ambulatory Visit: Payer: Self-pay

## 2020-09-14 DIAGNOSIS — R413 Other amnesia: Secondary | ICD-10-CM

## 2020-09-14 DIAGNOSIS — R410 Disorientation, unspecified: Secondary | ICD-10-CM

## 2020-09-14 DIAGNOSIS — I4811 Longstanding persistent atrial fibrillation: Secondary | ICD-10-CM

## 2020-09-14 DIAGNOSIS — G4733 Obstructive sleep apnea (adult) (pediatric): Secondary | ICD-10-CM

## 2020-09-14 NOTE — Progress Notes (Signed)
EEG completed with photic stimulation without hyperventilation at [facility]. Full report to follow by [facility].

## 2020-09-14 NOTE — Procedures (Signed)
    History:  William Preston is a 70 year old gentleman with a history of hypertension, dyslipidemia, diabetes, atrial fibrillation, sleep apnea and some short-term memory issues.  He has had episodes of confusion and staring spells and currently is being evaluated for possible seizure type events.  This is a routine EEG.  No skull defects are noted.  Medications include Eliquis, vitamin D, vitamin C, digoxin, Cardizem, Cardura, finasteride, Lasix, Glucovance, Cozaar, Lopressor, multivitamins, Pravachol, and vitamin B-12.  EEG classification: Normal awake and drowsy  Description of the recording: The background rhythms of this recording consists of a fairly well modulated medium amplitude alpha rhythm of 11 Hz that is reactive to eye opening and closure. As the record progresses, the patient appears to remain in the waking state throughout the recording. Photic stimulation was performed, resulting in a bilateral and symmetric photic driving response. Hyperventilation was also performed, resulting in a minimal buildup of the background rhythm activities without significant slowing seen. Toward the end of the recording, the patient enters the drowsy state with slight symmetric slowing seen. The patient never enters stage II sleep. At no time during the recording does there appear to be evidence of spike or spike wave discharges or evidence of focal slowing. EKG monitor shows an irregular rhythm with a heart rate of 66.  Impression: This is a normal EEG recording in the waking and drowsy state. No evidence of ictal or interictal discharges are seen.

## 2020-09-14 NOTE — Telephone Encounter (Signed)
Athar. I spoke with the patient and his wife is nonspecific.  Discussed the EEG results per Dr. Patient verbalized understanding.  His wife stated they are still thinking about whether or not they want to proceed with the neuropsychology testing.  I asked him to let us know when they decide.  They verbalized appreciation and understanding.

## 2020-09-14 NOTE — Telephone Encounter (Signed)
-----   Message from Star Age, MD sent at 09/14/2020  4:12 PM EDT ----- Please call and advise the patient that the EEG or brain wave test we performed was reported as normal in the awake and drowsy states. We checked for abnormal electrical discharges in the brain waves and the report suggested normal findings. No further action is required on this test at this time. Please inquire if you would like to proceed with the referral to neuropsychology for more in-depth memory testing.  We had talked about it during the appointment recently.  If he would like to go ahead, I can make a referral.

## 2020-10-10 ENCOUNTER — Ambulatory Visit: Payer: Medicare Other | Admitting: Gastroenterology

## 2020-11-08 ENCOUNTER — Telehealth: Payer: Self-pay | Admitting: Neurology

## 2020-11-08 DIAGNOSIS — R413 Other amnesia: Secondary | ICD-10-CM

## 2020-11-08 NOTE — Telephone Encounter (Signed)
Spoke with pt's wife Pricilla Holm (on [place]) and advised we place order for the formal neurocognitive testing. She said [facility] would be fine.

## 2020-11-08 NOTE — Telephone Encounter (Signed)
Pt's wife called asking about neuropsychology referral and states her husband is ready now. Requesting a call back.

## 2020-11-09 ENCOUNTER — Telehealth: Payer: Self-pay | Admitting: *Deleted

## 2020-11-09 NOTE — Telephone Encounter (Signed)
Neurology & Sleep notes received on 11/09/20

## 2020-11-10 ENCOUNTER — Encounter: Payer: Self-pay | Admitting: Psychology

## 2020-12-11 ENCOUNTER — Encounter: Payer: Self-pay | Admitting: Gastroenterology

## 2020-12-11 NOTE — Progress Notes (Signed)
Referring Provider: Andres Shad, MD Primary Care Physician:  Andres Shad, MD Primary Gastroenterologist:  Dr. Abbey Chatters  Chief Complaint  Patient presents with   Consult    Due for TCS    HPI:   William Preston is a 70 y.o. male presenting today on recall for surveillance colonoscopy.  Last colonoscopy 06/20/2017: 6 polyps removed (tubular adenomas), diverticulosis in the descending, sigmoid, and rectosigmoid colon, external and internal hemorrhoids, redundant left colon.  Recommended repeat in 3 years.  Today:  Reports he is feeling well overall.  Denies abdominal pain, BRBPR, melena, unintentional weight loss.  Chronically, his bowels do fluctuate from [facility] 4 to [place] 7.  2-3 times a week, he will have a [place] 7 stool.  This typically occurs in the morning about 30 minutes after taking his morning medications.  He only has 1 bowel movement.  No persistent diarrhea throughout the day.    Denies any upper GI symptoms such as nausea, vomiting, GERD, or dysphagia.  FHx: No colon cancer or polyps.  Occasionally, he can feel irregular HR. No CP or SOB.   Past Medical History:  Diagnosis Date   Aortic stenosis    Mild on ECHO in August 2021   Chronic atrial fibrillation ([place])    DM (diabetes mellitus) ([place])    Hyperlipidemia    Hypertension    Memory loss    OSA on CPAP     Past Surgical History:  Procedure Laterality Date   CARDIAC CATHETERIZATION  11/2003   normal   CARDIOVERSION  05/25/2008   successful   COLONOSCOPY N/A 06/20/2017   Surgeon: Danie Binder, MD; 6 polyps removed (tubular adenomas), diverticulosis in the descending, sigmoid, and rectosigmoid colon, external and internal hemorrhoids, redundant left colon.  Recommended repeat in 3 years.   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 180 tablet 1   Ascorbic Acid (VITAMIN C) 1000 MG tablet Take 1,000 mg by mouth daily.     cholecalciferol (VITAMIN D) 1000 units tablet Take 1,000 Units by mouth daily.     digoxin (LANOXIN) 0.25 MG tablet TAKE 1/2 TABLET BY MOUTH EVERY DAY 45 tablet 3   diltiazem (CARDIZEM CD) 240 MG 24 hr capsule Take 1 capsule (240 mg total) by mouth daily. 90 capsule 3   doxazosin (CARDURA) 2 MG tablet Take 4 mg by mouth daily.     finasteride (PROSCAR) 5 MG tablet Take 5 mg by mouth daily.  0   furosemide (LASIX) 40 MG tablet TAKE 1/2 TABLET BY MOUTH EVERY DAY AS NEEDED 15 tablet 2   glyBURIDE-metformin (GLUCOVANCE) 2.5-500 MG per tablet Take 1 tablet by mouth 3 (three) times daily with meals.      losartan (COZAAR) 50 MG tablet Take 1 tablet (50 mg total) by mouth daily.  90 tablet 3   metoprolol tartrate (LOPRESSOR) 25 MG tablet Take 1 tablet (25 mg total) by mouth daily. 90 tablet 3   Multiple Vitamins-Minerals (MULTIVITAMIN WITH MINERALS) tablet Take 1 tablet by mouth daily.     pravastatin (PRAVACHOL) 80 MG tablet Take 1 tablet (80 mg total) by mouth daily. 90 tablet 0   vitamin B-12 (CYANOCOBALAMIN) 1000 MCG tablet Take 1,000 mcg by mouth daily.     vitamin E 400 UNIT capsule Take 400 Units by mouth daily.     No current facility-administered medications for this visit.    Allergies as of 12/12/2020   (No Known Allergies)    Family History  Problem Relation Age of Onset   Heart failure Mother    Heart failure Father    Hyperlipidemia Brother     Hyperlipidemia Sister    Colon cancer Neg Hx    Colon polyps Neg Hx     Social History   Socioeconomic History   Marital status: Married    Spouse name: Not on file   Number of children: Not on file   Years of education: Not on file   Highest education level: Not on file  Occupational History   Occupation: retired    Comment: [employer] in [place]  Tobacco Use   Smoking status: Never   Smokeless tobacco: Never  Vaping Use   Vaping Use: Never used  Substance and Sexual Activity   Alcohol use: No   Drug use: No   Sexual activity: Not on file  Other Topics Concern   Not on file  Social History Narrative   Not on file   Social Determinants of Health   Financial Resource Strain: Not on file  Food Insecurity: Not on file  Transportation Needs: Not on file  Physical Activity: Not on file  Stress: Not on file  Social Connections: Not on file  Intimate Partner Violence: Not on file    Review of Systems: Gen: Denies any fever, chills, cold or flulike symptoms, presyncope, syncope. CV: Denies chest pain.  Admits to occasional sensation of irregular heart rate. Resp: Denies shortness of breath or cough. GI: See HPI GU : Denies urinary burning, urinary frequency, urinary hesitancy MS: Denies joint pain Derm: Denies rash Psych: Denies depression, anxiety Heme: See HPI  Physical Exam: BP 103/67   Pulse (!) 103   Temp (!) 96.9 F (36.1 C)   Ht 5\' 10"  (1.778 m)   Wt 201 lb 9.6 oz (91.4 kg)   BMI 28.93 kg/m  General:   Alert and oriented. Pleasant and cooperative. Well-nourished and well-developed.  Head:  Normocephalic and atraumatic. Eyes:  Without icterus, sclera clear and conjunctiva pink.  Ears:  Normal auditory acuity. Lungs:  Clear to auscultation bilaterally. No wheezes, rales, or rhonchi. No distress.  Heart: Irregularly irregular rate and rhythm.  No tachycardia.  Mild, harsh systolic murmur at second right intercostal space. Abdomen:  +BS, soft, non-tender  and non-distended. No HSM noted. No guarding or rebound. No masses appreciated.  Rectal:  Deferred  Msk:  Symmetrical without gross deformities. Normal posture. Extremities:  With trace LE edema. Neurologic:  Alert and  oriented x4;  grossly normal neurologically. Skin:  Intact without significant lesions or rashes. Psych:  Normal mood and affect.    Assessment: 70 year old male with history of chronic atrial fibrillation on Eliquis, mild aortic stenosis, type 2 diabetes, HTN, HLD, sleep apnea on CPAP, adenomatous colon polyps, presenting today to discuss scheduling surveillance colonoscopy.  Last colonoscopy  in June 2019 with 6 polyps removed (tubular adenomas), diverticulosis, external and internal hemorrhoids, redundant left colon, with recommendations to repeat in 3 years.  He has no significant upper or lower GI symptoms at this time.  No alarm symptoms.  No family history of colon cancer.   Of note, after his last colonoscopy in 2019, he did have some rectal bleeding.  Encouragingly, his hemoglobin remained within normal limits and bleeding self resolved.  He was on warfarin at that time.   Plan:  Proceed with colonoscopy with propofol with Dr. Abbey Chatters in the near future. The risks, benefits, and alternatives have been discussed with the patient in detail. The patient states understanding and desires to proceed. ASA 3 We will reach out to his cardiologist to get the okay to hold Eliquis x48 hours prior to procedure. See separate instructions for diabetes medication adjustments. Follow-up as needed.   Aliene Altes, PA-C [facility] 12/12/2020

## 2020-12-12 ENCOUNTER — Encounter: Payer: Self-pay | Admitting: Gastroenterology

## 2020-12-12 ENCOUNTER — Telehealth: Payer: Self-pay

## 2020-12-12 ENCOUNTER — Encounter: Payer: Self-pay | Admitting: Internal Medicine

## 2020-12-12 ENCOUNTER — Ambulatory Visit (INDEPENDENT_AMBULATORY_CARE_PROVIDER_SITE_OTHER): Payer: Medicare Other | Admitting: Gastroenterology

## 2020-12-12 ENCOUNTER — Other Ambulatory Visit: Payer: Self-pay

## 2020-12-12 VITALS — BP 103/67 | HR 103 | Temp 96.9°F | Ht 70.0 in | Wt 201.6 lb

## 2020-12-12 DIAGNOSIS — Z860100 Personal history of colon polyps, unspecified: Secondary | ICD-10-CM | POA: Insufficient documentation

## 2020-12-12 DIAGNOSIS — Z7901 Long term (current) use of anticoagulants: Secondary | ICD-10-CM

## 2020-12-12 DIAGNOSIS — Z01818 Encounter for other preprocedural examination: Secondary | ICD-10-CM

## 2020-12-12 HISTORY — DX: Personal history of colon polyps, unspecified: Z86.0100

## 2020-12-12 HISTORY — DX: Personal history of colonic polyps: Z86.010

## 2020-12-12 NOTE — Telephone Encounter (Signed)
Patient with diagnosis of atrial fibrillation on Eliquis for anticoagulation.    Procedure: colonoscopy Date of procedure: TBD   CHA2DS2-VASc Score = 3   This indicates a 3.2% annual risk of stroke. The patient's score is based upon: CHF History: 0 HTN History: 1 Diabetes History: 1 Stroke History: 0 Vascular Disease History: 0 Age Score: 1 Gender Score: 0   CrCl 90 (with adjusted body weight)   Patient has no CBC on record since 2019.  Will need to have that drawn to confirm clearance

## 2020-12-12 NOTE — Telephone Encounter (Signed)
Attention: Preop   We would like to request holding the following medication for patient please.  Procedure: Colonoscopy  Date: TBD  Medication to hold: Eliquis x 48 hrs  Surgeon: Dr. Carver  Phone: [phone]  Fax:  [phone]  Type of Anesthesia: Propofol         

## 2020-12-12 NOTE — Patient Instructions (Signed)
We will arrange for you to have a colonoscopy in January with Dr. Abbey Chatters. We are reaching out to your cardiologist to get the okay to hold Eliquis for 48 hours prior to your procedure.  1 day prior to procedure: Take one half dose of Glucovance 3 times daily with meals. Day of procedure: Do not take any morning diabetes medications.  While you are on clear liquids, keep a close check on your blood sugars and correct any low blood sugars with approved sugary clear liquids.  We will plan to see you back as needed.  Do not hesitate to call if you have any questions or concerns.  It was a pleasure to meet you today!  I hope you have a very Merry Christmas!  Aliene Altes, PA-C [facility]

## 2020-12-13 NOTE — Addendum Note (Signed)
Addended by: Gaetano Net on: 12/13/2020 08:11 AM   Modules accepted: Orders

## 2020-12-13 NOTE — Telephone Encounter (Signed)
Pt has been scheduled to come in for a CBC 12/21/2020.

## 2020-12-21 ENCOUNTER — Other Ambulatory Visit: Payer: Medicare Other | Admitting: *Deleted

## 2020-12-21 ENCOUNTER — Other Ambulatory Visit: Payer: Self-pay

## 2020-12-21 ENCOUNTER — Other Ambulatory Visit: Payer: Self-pay | Admitting: *Deleted

## 2020-12-21 DIAGNOSIS — Z01818 Encounter for other preprocedural examination: Secondary | ICD-10-CM

## 2020-12-22 LAB — CBC
Hematocrit: 40.8 % (ref 37.5–51.0)
Hemoglobin: 13.6 g/dL (ref 13.0–17.7)
MCH: 29.1 pg (ref 26.6–33.0)
MCHC: 33.3 g/dL (ref 31.5–35.7)
MCV: 87 fL (ref 79–97)
Platelets: 185 10*3/uL (ref 150–450)
RBC: 4.68 x10E6/uL (ref 4.14–5.80)
RDW: 13.3 % (ref 11.6–15.4)
WBC: 7 10*3/uL (ref 3.4–10.8)

## 2020-12-22 NOTE — Telephone Encounter (Signed)
Reviewed.  Okay to proceed with scheduling colonoscopy as planned and hold Eliquis x48 hours prior to procedure.

## 2020-12-22 NOTE — Telephone Encounter (Addendum)
   Name: William Preston  DOB: [birth year]  MRN: [id number]   Primary Cardiologist: Quay Burow, MD  Chart reviewed as part of pre-operative protocol coverage. Patient was contacted 12/22/2020 in reference to pre-operative risk assessment for pending surgery as outlined below.  William Preston was last seen on 08/2020 by Dr. Gwenlyn Found, history reviewed. Reached out to patient to ensure no new updates to afib or recent DCCVs, LMTCB with wife.  Addendum: Pt/wife called back but was on another call at the time. I tried to call them back. Twice they picked up and hung back up. On third try I left another VMTCB.  2nd addendum: I reached out to patient for update on how he is doing. The patient affirms he has been doing well without any new cardiac symptoms. Therefore, based on ACC/AHA guidelines, the patient would be at acceptable risk for the planned procedure without further cardiovascular testing. The patient was advised that if he develops new symptoms prior to surgery to contact our office to arrange for a follow-up visit, and he verbalized understanding. Per pharmacy team, Ok to hold Eliquis for 2 days prior to procedure as requested.  Will route this bundled recommendation to requesting provider via Epic fax function. Please call with questions.   Charlie Pitter, PA-C 12/22/2020, 8:15 AM

## 2020-12-22 NOTE — Telephone Encounter (Signed)
Pt had CBC checked yesterday, plt stable at 185K. Previous CHADS2VASc score calculated at 3, no hx of stroke.  Ok to hold Eliquis for 2 days prior to procedure as requested.

## 2020-12-23 NOTE — Telephone Encounter (Signed)
Noted. Per encounter form patient wants January. Will call once we receive that schedule

## 2020-12-29 ENCOUNTER — Telehealth: Payer: Self-pay | Admitting: *Deleted

## 2020-12-29 MED ORDER — PEG 3350-KCL-NA BICARB-NACL 420 G PO SOLR: ORAL | 0 refills | Status: DC

## 2020-12-29 NOTE — Addendum Note (Signed)
Addended by: Cheron Every on: 12/29/2020 03:49 PM   Modules accepted: Orders

## 2020-12-29 NOTE — Telephone Encounter (Signed)
Called pt to schedule TCS with propofol, ASA 3. He is not at home and he will call back.

## 2020-12-29 NOTE — Telephone Encounter (Signed)
Patient spouse called back. He has been scheduled for 1/16 at 7:30am. Aware will mail prep instructions with pre-op appt. Will send rx for prep to pharmacy.

## 2020-12-30 ENCOUNTER — Encounter: Payer: Self-pay | Admitting: *Deleted

## 2021-01-11 ENCOUNTER — Telehealth: Payer: Self-pay | Admitting: Cardiovascular Disease

## 2021-01-11 NOTE — Telephone Encounter (Signed)
Patient's wife called stating William Preston tested positive for COVID last night and she would like to get him a script for Paxlovid.

## 2021-01-11 NOTE — Telephone Encounter (Signed)
Spoke with wife of patient. She stated that yesterday, patient had a cough and body aches; no fever. She home-tested him for COVID with an "immediate positive result". She wanted a prescription for Paxlovid. Their PCP office is closed due to broken water pipes. I recommended she take patient to an urgent care to be seen and get a prescription. She voiced understanding.

## 2021-01-23 NOTE — Patient Instructions (Signed)
William Preston  01/23/2021     @PREFPERIOPPHARMACY @   Your procedure is scheduled on  01/30/2021.   Report to [facility] at  [phone]  A.M.   Call this number if you have problems the morning of surgery:  [phone]   Remember:  Follow the diet and prep instructions given to you by the office.    Your last dose of eliquis should be 01/27/2021.    DO NOT take any medications for diabetes the morning of your procedure.    Take these medicines the morning of surgery with A SIP OF WATER                        digoxin, diltiazem, proscar, metoprolol.     Do not wear jewelry, make-up or nail polish.  Do not wear lotions, powders, or perfumes, or deodorant.  Do not shave 48 hours prior to surgery.  Men may shave face and neck.  Do not bring valuables to the hospital.  [facility] is not responsible for any belongings or valuables.  Contacts, dentures or bridgework may not be worn into surgery.  Leave your suitcase in the car.  After surgery it may be brought to your room.  For patients admitted to the hospital, discharge time will be determined by your treatment team.  Patients discharged the day of surgery will not be allowed to drive home and must have someone with them for 24 hours.    Special instructions:   DO NOT smoke tobacco or vape for 24 hours before your procedure.  Please read over the following fact sheets that you were given. Anesthesia Post-op Instructions and Care and Recovery After Surgery      Colonoscopy, Adult, Care After This sheet gives you information about how to care for yourself after your procedure. Your health care provider may also give you more specific instructions. If you have problems or questions, contact your health care provider. What can I expect after the procedure? After the procedure, it is common to have: A small amount of blood in your stool for 24 hours after the procedure. Some gas. Mild cramping or bloating of your  abdomen. Follow these instructions at home: Eating and drinking  Drink enough fluid to keep your urine pale yellow. Follow instructions from your health care provider about eating or drinking restrictions. Resume your normal diet as instructed by your health care provider. Avoid heavy or fried foods that are hard to digest. Activity Rest as told by your health care provider. Avoid sitting for a long time without moving. Get up to take short walks every 1-2 hours. This is important to improve blood flow and breathing. Ask for help if you feel weak or unsteady. Return to your normal activities as told by your health care provider. Ask your health care provider what activities are safe for you. Managing cramping and bloating  Try walking around when you have cramps or feel bloated. Apply heat to your abdomen as told by your health care provider. Use the heat source that your health care provider recommends, such as a moist heat pack or a heating pad. Place a towel between your skin and the heat source. Leave the heat on for 20-30 minutes. Remove the heat if your skin turns bright red. This is especially important if you are unable to feel pain, heat, or cold. You may have a greater risk of getting burned. General instructions If  you were given a sedative during the procedure, it can affect you for several hours. Do not drive or operate machinery until your health care provider says that it is safe. For the first 24 hours after the procedure: Do not sign important documents. Do not drink alcohol. Do your regular daily activities at a slower pace than normal. Eat soft foods that are easy to digest. Take over-the-counter and prescription medicines only as told by your health care provider. Keep all follow-up visits as told by your health care provider. This is important. Contact a health care provider if: You have blood in your stool 2-3 days after the procedure. Get help right away if you  have: More than a small spotting of blood in your stool. Large blood clots in your stool. Swelling of your abdomen. Nausea or vomiting. A fever. Increasing pain in your abdomen that is not relieved with medicine. Summary After the procedure, it is common to have a small amount of blood in your stool. You may also have mild cramping and bloating of your abdomen. If you were given a sedative during the procedure, it can affect you for several hours. Do not drive or operate machinery until your health care provider says that it is safe. Get help right away if you have a lot of blood in your stool, nausea or vomiting, a fever, or increased pain in your abdomen. This information is not intended to replace advice given to you by your health care provider. Make sure you discuss any questions you have with your health care provider. Document Revised: 11/07/2018 Document Reviewed: 07/28/2018 Elsevier Patient Education  [place] After This sheet gives you information about how to care for yourself after your procedure. Your health care provider may also give you more specific instructions. If you have problems or questions, contact your health care provider. What can I expect after the procedure? After the procedure, it is common to have: Tiredness. Forgetfulness about what happened after the procedure. Impaired judgment for important decisions. Nausea or vomiting. Some difficulty with balance. Follow these instructions at home: For the time period you were told by your health care provider:   Rest as needed. Do not participate in activities where you could fall or become injured. Do not drive or use machinery. Do not drink alcohol. Do not take sleeping pills or medicines that cause drowsiness. Do not make important decisions or sign legal documents. Do not take care of children on your own. Eating and drinking Follow the diet that is recommended by  your health care provider. Drink enough fluid to keep your urine pale yellow. If you vomit: Drink water, juice, or soup when you can drink without vomiting. Make sure you have little or no nausea before eating solid foods. General instructions Have a responsible adult stay with you for the time you are told. It is important to have someone help care for you until you are awake and alert. Take over-the-counter and prescription medicines only as told by your health care provider. If you have sleep apnea, surgery and certain medicines can increase your risk for breathing problems. Follow instructions from your health care provider about wearing your sleep device: Anytime you are sleeping, including during daytime naps. While taking prescription pain medicines, sleeping medicines, or medicines that make you drowsy. Avoid smoking. Keep all follow-up visits as told by your health care provider. This is important. Contact a health care provider if: You keep feeling nauseous or  you keep vomiting. You feel light-headed. You are still sleepy or having trouble with balance after 24 hours. You develop a rash. You have a fever. You have redness or swelling around the IV site. Get help right away if: You have trouble breathing. You have new-onset confusion at home. Summary For several hours after your procedure, you may feel tired. You may also be forgetful and have poor judgment. Have a responsible adult stay with you for the time you are told. It is important to have someone help care for you until you are awake and alert. Rest as told. Do not drive or operate machinery. Do not drink alcohol or take sleeping pills. Get help right away if you have trouble breathing, or if you suddenly become confused. This information is not intended to replace advice given to you by your health care provider. Make sure you discuss any questions you have with your health care provider. Document Revised: 09/17/2019  Document Reviewed: 12/04/2018 Elsevier Patient Education  2022 [employer].

## 2021-01-25 ENCOUNTER — Encounter (HOSPITAL_COMMUNITY): Payer: Self-pay

## 2021-01-25 ENCOUNTER — Other Ambulatory Visit: Payer: Self-pay

## 2021-01-25 ENCOUNTER — Encounter (HOSPITAL_COMMUNITY)
Admission: RE | Admit: 2021-01-25 | Discharge: 2021-01-25 | Disposition: A | Discharge status: Home / Self Care | Payer: Medicare Other | Source: Ambulatory Visit | Attending: Internal Medicine | Admitting: Internal Medicine

## 2021-01-25 VITALS — BP 116/77 | HR 78 | Temp 97.6°F | Resp 18 | Ht 69.0 in | Wt 194.0 lb

## 2021-01-25 DIAGNOSIS — E119 Type 2 diabetes mellitus without complications: Secondary | ICD-10-CM | POA: Diagnosis not present

## 2021-01-25 DIAGNOSIS — Z01812 Encounter for preprocedural laboratory examination: Secondary | ICD-10-CM | POA: Diagnosis not present

## 2021-01-25 LAB — BASIC METABOLIC PANEL
Anion gap: 7 (ref 5–15)
BUN: 21 mg/dL (ref 8–23)
CO2: 25 mmol/L (ref 22–32)
Calcium: 9.6 mg/dL (ref 8.9–10.3)
Chloride: 106 mmol/L (ref 98–111)
Creatinine, Ser: 0.86 mg/dL (ref 0.61–1.24)
GFR, Estimated: >60 mL/min (ref >60)
Glucose, Bld: 130 mg/dL — ABNORMAL HIGH (ref 70–99)
Potassium: 4.2 mmol/L (ref 3.5–5.1)
Sodium: 138 mmol/L (ref 135–145)

## 2021-01-30 ENCOUNTER — Ambulatory Visit (HOSPITAL_COMMUNITY)
Admission: RE | Admit: 2021-01-30 | Discharge: 2021-01-30 | Disposition: A | Discharge status: Home / Self Care | Payer: Medicare Other | Source: Ambulatory Visit | Attending: Internal Medicine | Admitting: Internal Medicine

## 2021-01-30 ENCOUNTER — Encounter (HOSPITAL_COMMUNITY)
Admission: RE | Disposition: A | Discharge status: Home / Self Care | Payer: Self-pay | Source: Ambulatory Visit | Attending: Internal Medicine

## 2021-01-30 ENCOUNTER — Ambulatory Visit (HOSPITAL_COMMUNITY): Payer: Medicare Other | Admitting: Anesthesiology

## 2021-01-30 ENCOUNTER — Encounter (HOSPITAL_COMMUNITY): Payer: Self-pay

## 2021-01-30 ENCOUNTER — Other Ambulatory Visit: Payer: Self-pay

## 2021-01-30 DIAGNOSIS — K635 Polyp of colon: Secondary | ICD-10-CM | POA: Insufficient documentation

## 2021-01-30 DIAGNOSIS — G4730 Sleep apnea, unspecified: Secondary | ICD-10-CM | POA: Insufficient documentation

## 2021-01-30 DIAGNOSIS — E119 Type 2 diabetes mellitus without complications: Secondary | ICD-10-CM | POA: Insufficient documentation

## 2021-01-30 DIAGNOSIS — D123 Benign neoplasm of transverse colon: Secondary | ICD-10-CM

## 2021-01-30 DIAGNOSIS — K648 Other hemorrhoids: Secondary | ICD-10-CM | POA: Insufficient documentation

## 2021-01-30 DIAGNOSIS — I350 Nonrheumatic aortic (valve) stenosis: Secondary | ICD-10-CM | POA: Diagnosis not present

## 2021-01-30 DIAGNOSIS — Z09 Encounter for follow-up examination after completed treatment for conditions other than malignant neoplasm: Secondary | ICD-10-CM | POA: Diagnosis present

## 2021-01-30 DIAGNOSIS — K5730 Diverticulosis of large intestine without perforation or abscess without bleeding: Secondary | ICD-10-CM | POA: Diagnosis not present

## 2021-01-30 DIAGNOSIS — I10 Essential (primary) hypertension: Secondary | ICD-10-CM | POA: Diagnosis not present

## 2021-01-30 DIAGNOSIS — I4891 Unspecified atrial fibrillation: Secondary | ICD-10-CM | POA: Insufficient documentation

## 2021-01-30 DIAGNOSIS — D122 Benign neoplasm of ascending colon: Secondary | ICD-10-CM | POA: Diagnosis not present

## 2021-01-30 HISTORY — PX: POLYPECTOMY: SHX5525

## 2021-01-30 HISTORY — PX: COLONOSCOPY WITH PROPOFOL: SHX5780

## 2021-01-30 LAB — GLUCOSE, CAPILLARY: Glucose-Capillary: 146 mg/dL — ABNORMAL HIGH (ref 70–99)

## 2021-01-30 SURGERY — COLONOSCOPY WITH PROPOFOL
Anesthesia: General

## 2021-01-30 MED ORDER — PHENYLEPHRINE 40 MCG/ML (10ML) SYRINGE FOR IV PUSH (FOR BLOOD PRESSURE SUPPORT)
PREFILLED_SYRINGE | INTRAVENOUS | Status: AC
  Filled 2021-01-30: qty 10

## 2021-01-30 MED ORDER — LIDOCAINE HCL (CARDIAC) PF 100 MG/5ML IV SOSY
PREFILLED_SYRINGE | INTRAVENOUS | Status: DC | PRN
  Administered 2021-01-30: 50 mg via INTRAVENOUS

## 2021-01-30 MED ORDER — PROPOFOL 10 MG/ML IV BOLUS
INTRAVENOUS | Status: DC | PRN
  Administered 2021-01-30: 50 mg via INTRAVENOUS
  Administered 2021-01-30: 100 mg via INTRAVENOUS
  Administered 2021-01-30: 30 mg via INTRAVENOUS
  Administered 2021-01-30: 20 mg via INTRAVENOUS

## 2021-01-30 MED ORDER — LACTATED RINGERS IV SOLN: INTRAVENOUS | Status: DC | PRN

## 2021-01-30 MED ORDER — PHENYLEPHRINE 40 MCG/ML (10ML) SYRINGE FOR IV PUSH (FOR BLOOD PRESSURE SUPPORT)
PREFILLED_SYRINGE | INTRAVENOUS | Status: DC | PRN
  Administered 2021-01-30 (×2): 80 ug via INTRAVENOUS

## 2021-01-30 NOTE — Transfer of Care (Signed)
Immediate Anesthesia Transfer of Care Note  Patient: William Preston  Procedure(s) Performed: COLONOSCOPY WITH PROPOFOL POLYPECTOMY  Patient Location: Short Stay  Anesthesia Type:General  Level of Consciousness: awake and oriented  Airway & Oxygen Therapy: Patient Spontanous Breathing  Post-op Assessment: Report given to RN and Post -op Vital signs reviewed and stable  Post vital signs: Reviewed and stable  Last Vitals:  Vitals Value Taken Time  BP    Temp    Pulse    Resp    SpO2      Last Pain:  Vitals:   01/30/21 0735  TempSrc:   PainSc: 0-No pain         Complications: No notable events documented.

## 2021-01-30 NOTE — Op Note (Signed)
[facility] Patient Name: William Preston Procedure Date: 01/30/2021 7:05 AM MRN: [id number] Date of Birth: Aug 29, 1950 Attending MD: Elon Alas. Abbey Chatters DO CSN: [id number] Age: 71 Admit Type: Outpatient Procedure:                Colonoscopy Indications:              Surveillance: Personal history of adenomatous                            polyps on last colonoscopy 3 years ago Providers:                Elon Alas. Abbey Chatters, DO, Charlsie Quest. Theda Sers RN, RN,                            Raphael Gibney, Technician Referring MD:              Medicines:                See the Anesthesia note for documentation of the                            administered medications Complications:            No immediate complications. Estimated Blood Loss:     Estimated blood loss was minimal. Procedure:                Pre-Anesthesia Assessment:                           - The anesthesia plan was to use monitored                            anesthesia care (MAC).                           After obtaining informed consent, the colonoscope                            was passed under direct vision. Throughout the                            procedure, the patient's blood pressure, pulse, and                            oxygen saturations were monitored continuously. The                            [id number]-HQ190L ([id number]) scope was introduced through                            the anus and advanced to the the cecum, identified                            by appendiceal orifice and ileocecal valve. The                            colonoscopy was performed  without difficulty. The                            patient tolerated the procedure well. The quality                            of the bowel preparation was evaluated using the                            BBPS [facility] Bowel Preparation Scale) with scores                            of: Right Colon = 2 (minor amount of residual                            staining, small  fragments of stool and/or opaque                            liquid, but mucosa seen well), Transverse Colon = 2                            (minor amount of residual staining, small fragments                            of stool and/or opaque liquid, but mucosa seen                            well) and Left Colon = 2 (minor amount of residual                            staining, small fragments of stool and/or opaque                            liquid, but mucosa seen well). The total BBPS score                            equals 6. The quality of the bowel preparation was                            fair. Scope In: 7:38:25 AM Scope Out: 7:54:49 AM Scope Withdrawal Time: 0 hours 14 minutes 12 seconds  Total Procedure Duration: 0 hours 16 minutes 24 seconds  Findings:      The perianal and digital rectal examinations were normal.      Non-bleeding internal hemorrhoids were found during endoscopy.      Scattered small and large-mouthed diverticula were found in the entire       colon.      Three sessile polyps were found in the ascending colon. The polyps were       6 to 8 mm in size. These polyps were removed with a cold snare.       Resection and retrieval were complete.      A 3 mm polyp was found in the transverse colon. The polyp was sessile.  The polyp was removed with a cold snare. Resection and retrieval were       complete.      The exam was otherwise without abnormality. Impression:               - Preparation of the colon was fair.                           - Non-bleeding internal hemorrhoids.                           - Diverticulosis in the entire examined colon.                           - Three 6 to 8 mm polyps in the ascending colon,                            removed with a cold snare. Resected and retrieved.                           - One 3 mm polyp in the transverse colon, removed                            with a cold snare. Resected and retrieved.                            - The examination was otherwise normal. Moderate Sedation:      Per Anesthesia Care Recommendation:           - Patient has a contact number available for                            emergencies. The signs and symptoms of potential                            delayed complications were discussed with the                            patient. Return to normal activities tomorrow.                            Written discharge instructions were provided to the                            patient.                           - Resume previous diet.                           - Continue present medications.                           - Await pathology results.                           - Repeat colonoscopy  in 3 years for surveillance.                           - Return to GI clinic PRN. Procedure Code(s):        --- Professional ---                           [phone], Colonoscopy, flexible; with removal of                            tumor(s), polyp(s), or other lesion(s) by snare                            technique Diagnosis Code(s):        --- Professional ---                           K63.5, Polyp of colon                           Z86.010, Personal history of colonic polyps                           K64.8, Other hemorrhoids                           K57.30, Diverticulosis of large intestine without                            perforation or abscess without bleeding CPT copyright 2019 American Medical Association. All rights reserved. The codes documented in this report are preliminary and upon coder review may  be revised to meet current compliance requirements. Elon Alas. Abbey Chatters, DO [place] Abbey Chatters, DO 01/30/2021 8:01:46 AM This report has been signed electronically. Number of Addenda: 0

## 2021-01-30 NOTE — Anesthesia Postprocedure Evaluation (Signed)
Anesthesia Post Note  Patient: William Preston  Procedure(s) Performed: COLONOSCOPY WITH PROPOFOL POLYPECTOMY  Patient location during evaluation: Endoscopy Anesthesia Type: General Level of consciousness: awake and alert Pain management: pain level controlled Vital Signs Assessment: post-procedure vital signs reviewed and stable Respiratory status: spontaneous breathing, nonlabored ventilation, respiratory function stable and patient connected to nasal cannula oxygen Cardiovascular status: blood pressure returned to baseline and stable Postop Assessment: no apparent nausea or vomiting Anesthetic complications: no   No notable events documented.   Last Vitals:  Vitals:   01/30/21 0710 01/30/21 0758  BP: (!) 157/98 108/72  Pulse: 89 68  Resp: (!) 23 17  Temp:  36.7 C  SpO2:  96%    Last Pain:  Vitals:   01/30/21 0758  TempSrc: Oral  PainSc: 0-No pain                 Trixie Rude

## 2021-01-30 NOTE — Anesthesia Preprocedure Evaluation (Signed)
Anesthesia Evaluation  Patient identified by MRN, date of birth, ID band Patient awake    Reviewed: Allergy & Precautions, Patient's Chart, lab work & pertinent test results  Airway Mallampati: II       Dental no notable dental hx.    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) AS  Rhythm:Irregular     Neuro/Psych negative neurological ROS     GI/Hepatic negative GI ROS, Neg liver ROS,   Endo/Other  negative endocrine ROSdiabetes  Renal/GU negative Renal ROS     Musculoskeletal negative musculoskeletal ROS (+)   Abdominal Normal abdominal exam  (+)   Peds  Hematology negative hematology ROS (+)   Anesthesia Other Findings   Reproductive/Obstetrics                             Anesthesia Physical Anesthesia Plan  ASA: 2  Anesthesia Plan: General   Post-op Pain Management:    Induction: Intravenous  PONV Risk Score and Plan:   Airway Management Planned: Nasal Cannula  Additional Equipment:   Intra-op Plan:   Post-operative Plan:   Informed Consent: I have reviewed the patients History and Physical, chart, labs and discussed the procedure including the risks, benefits and alternatives for the proposed anesthesia with the patient or authorized representative who has indicated his/her understanding and acceptance.       Plan Discussed with: CRNA  Anesthesia Plan Comments:         Anesthesia Quick Evaluation

## 2021-01-30 NOTE — Discharge Instructions (Addendum)
°  Colonoscopy Discharge Instructions  Read the instructions outlined below and refer to this sheet in the next few weeks. These discharge instructions provide you with general information on caring for yourself after you leave the hospital. Your doctor may also give you specific instructions. While your treatment has been planned according to the most current medical practices available, unavoidable complications occasionally occur.   ACTIVITY You may resume your regular activity, but move at a slower pace for the next 24 hours.  Take frequent rest periods for the next 24 hours.  Walking will help get rid of the air and reduce the bloated feeling in your belly (abdomen).  No driving for 24 hours (because of the medicine (anesthesia) used during the test).   Do not sign any important legal documents or operate any machinery for 24 hours (because of the anesthesia used during the test).  NUTRITION Drink plenty of fluids.  You may resume your normal diet as instructed by your doctor.  Begin with a light meal and progress to your normal diet. Heavy or fried foods are harder to digest and may make you feel sick to your stomach (nauseated).  Avoid alcoholic beverages for 24 hours or as instructed.  MEDICATIONS You may resume your normal medications unless your doctor tells you otherwise.  WHAT YOU CAN EXPECT TODAY Some feelings of bloating in the abdomen.  Passage of more gas than usual.  Spotting of blood in your stool or on the toilet paper.  IF YOU HAD POLYPS REMOVED DURING THE COLONOSCOPY: No aspirin products for 7 days or as instructed.  No alcohol for 7 days or as instructed.  Eat a soft diet for the next 24 hours.  FINDING OUT THE RESULTS OF YOUR TEST Not all test results are available during your visit. If your test results are not back during the visit, make an appointment with your caregiver to find out the results. Do not assume everything is normal if you have not heard from your  caregiver or the medical facility. It is important for you to follow up on all of your test results.  SEEK IMMEDIATE MEDICAL ATTENTION IF: You have more than a spotting of blood in your stool.  Your belly is swollen (abdominal distention).  You are nauseated or vomiting.  You have a temperature over 101.  You have abdominal pain or discomfort that is severe or gets worse throughout the day.    Your colonoscopy revealed 4 polyp(s) which I removed successfully. Await pathology results, my office will contact you. I recommend repeating colonoscopy in 3 years for surveillance purposes.   You also have diverticulosis and internal hemorrhoids. I would recommend increasing fiber in your diet or adding OTC Benefiber/Metamucil. Be sure to drink at least 4 to 6 glasses of water daily. Follow-up with GI as needed.   I hope you have a great rest of your week!  Elon Alas. Abbey Chatters, D.O. Gastroenterology and Hepatology [facility]

## 2021-01-30 NOTE — H&P (Signed)
Primary Care Physician:  Andres Shad, MD Primary Gastroenterologist:  Dr. Abbey Chatters  Pre-Procedure History & Physical: HPI:  William Preston is a 71 y.o. male is here for a colonoscopy to be performed for surveillance purposes. Last colonoscopy 06/20/2017: 6 polyps removed (tubular adenomas), diverticulosis in the descending, sigmoid, and rectosigmoid colon, external and internal hemorrhoids, redundant left colon.  Recommended repeat in 3 years.  Past Medical History:  Diagnosis Date   Aortic stenosis    Mild on ECHO in August 2021   Chronic atrial fibrillation ([place])    DM (diabetes mellitus) ([place])    Hyperlipidemia    Hypertension    Memory loss    OSA on CPAP     Past Surgical History:  Procedure Laterality Date   CARDIAC CATHETERIZATION  11/2003   normal   CARDIOVERSION  05/25/2008   successful   CATARACT EXTRACTION Bilateral    COLONOSCOPY N/A 06/20/2017   Surgeon: Danie Binder, MD; 6 polyps removed (tubular adenomas), diverticulosis in the descending, sigmoid, and rectosigmoid colon, external and internal hemorrhoids, redundant left colon.  Recommended repeat in 3 years.   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 Authorizing Provider  apixaban (ELIQUIS) 5 MG TABS tablet Take 1 tablet (5 mg total) by mouth 2 (two) times daily. 02/09/20  Yes Lorretta Harp, MD  Ascorbic Acid (VITAMIN C) 1000 MG tablet Take 1,000 mg by mouth daily.   Yes [provider]  cholecalciferol (VITAMIN D) 1000 units tablet Take 1,000 Units by mouth daily.   Yes [provider]  digoxin (LANOXIN) 0.25 MG tablet TAKE 1/2 TABLET BY MOUTH EVERY DAY 06/21/20  Yes Lorretta Harp, MD  diltiazem (CARDIZEM CD) 240 MG 24 hr capsule Take 1 capsule (240 mg total) by mouth daily. 03/03/20  Yes Lorretta Harp, MD  doxazosin (CARDURA) 4 MG tablet Take 4 mg by mouth daily.   Yes [provider]  finasteride (PROSCAR) 5 MG tablet Take 5 mg by mouth daily. 04/05/16  Yes [provider]  furosemide (LASIX) 40 MG tablet TAKE 1/2 TABLET BY MOUTH EVERY DAY AS NEEDED 09/07/20  Yes Lorretta Harp, MD  glyBURIDE-metformin (GLUCOVANCE) 2.5-500 MG per tablet Take 1 tablet by mouth 3 (three) times daily with meals.    Yes [provider]  losartan (COZAAR) 50 MG tablet Take 1 tablet (50 mg total) by mouth daily. 09/07/20  Yes Lorretta Harp, MD  metoprolol tartrate (LOPRESSOR) 25 MG tablet Take 1 tablet (25 mg total) by mouth daily. 09/07/20  Yes Lorretta Harp, MD  Multiple Vitamins-Minerals (MULTIVITAMIN WITH MINERALS) tablet Take 1 tablet by mouth daily.   Yes [provider]  Multiple Vitamins-Minerals (PRESERVISION AREDS 2 PO) Take 1 capsule by mouth in the morning and at bedtime.   Yes [provider]  polyethylene glycol-electrolytes (NULYTELY) 420 g solution As directed 12/29/20  Yes  Eloise Harman, DO  pravastatin (PRAVACHOL) 80 MG tablet Take 1 tablet (80 mg total) by mouth daily. 09/07/20  Yes Lorretta Harp, MD  vitamin B-12 (CYANOCOBALAMIN) 1000 MCG tablet Take 1,000 mcg by mouth daily.   Yes [provider]  vitamin E 400 UNIT capsule Take 400 Units by mouth daily.   Yes [provider]    Allergies as of 12/29/2020   (No Known Allergies)    Family History  Problem Relation Age of Onset   Heart failure Mother    Heart failure Father    Hyperlipidemia Brother    Hyperlipidemia  Sister    Colon cancer Neg Hx    Colon polyps Neg Hx     Social History   Socioeconomic History   Marital status: Married    Spouse name: Not on file   Number of children: Not on file   Years of education: Not on file   Highest education level: Not on file  Occupational History   Occupation: retired    Comment: [employer] in [place]  Tobacco Use   Smoking status: Never   Smokeless tobacco: Never  Vaping Use   Vaping Use: Never used  Substance and Sexual Activity   Alcohol use: No   Drug use: No   Sexual activity: Yes  Other Topics Concern   Not on file  Social History Narrative   Not on file   Social Determinants of Health   Financial Resource Strain: Not on file  Food Insecurity: Not on file  Transportation Needs: Not on file  Physical Activity: Not on file  Stress: Not on file  Social Connections: Not on file  Intimate Partner Violence: Not on file    Review of Systems: See HPI, otherwise negative ROS  Physical Exam: Vital signs in last 24 hours: Temp:  [98.7 F (37.1 C)] 98.7 F (37.1 C) (01/16 0707) Pulse Rate:  [89] 89 (01/16 0710) Resp:  [23] 23 (01/16 0710) BP: (157)/(98) 157/98 (01/16 0710)   General:   Alert,  Well-developed, well-nourished, pleasant and cooperative in NAD Head:  Normocephalic and atraumatic. Eyes:  Sclera clear, no icterus.   Conjunctiva pink. Ears:  Normal auditory acuity. Nose:  No deformity, discharge,  or lesions. Mouth:  No deformity or lesions, dentition normal. Neck:  Supple; no masses or thyromegaly. Lungs:  Clear throughout to auscultation.   No wheezes, crackles, or rhonchi. No acute distress. Heart:  Regular rate and rhythm; no murmurs, clicks, rubs,  or gallops. Abdomen:  Soft, nontender and nondistended. No masses, hepatosplenomegaly or hernias noted. Normal bowel sounds, without guarding, and without rebound.   Msk:  Symmetrical without gross deformities. Normal posture. Extremities:  Without clubbing or  edema. Neurologic:  Alert and  oriented x4;  grossly normal neurologically. Skin:  Intact without significant lesions or rashes. Cervical Nodes:  No significant cervical adenopathy. Psych:  Alert and cooperative. Normal mood and affect.  Impression/Plan: William Preston is here for a colonoscopy to be performed for surveillance purposes. Last colonoscopy 06/20/2017: 6 polyps removed (tubular adenomas), diverticulosis in the descending, sigmoid, and rectosigmoid colon, external and internal hemorrhoids, redundant left colon.  Recommended repeat in 3 years.  The risks of the procedure including infection, bleed, or perforation as well as benefits, limitations, alternatives and imponderables have been reviewed with the patient. Questions have been answered. All parties agreeable.

## 2021-01-31 LAB — SURGICAL PATHOLOGY

## 2021-02-01 ENCOUNTER — Encounter (HOSPITAL_COMMUNITY): Payer: Self-pay | Admitting: Internal Medicine

## 2021-02-01 ENCOUNTER — Telehealth: Payer: Self-pay | Admitting: Cardiovascular Disease

## 2021-02-01 MED ORDER — APIXABAN 5 MG PO TABS: 5.0000 mg | ORAL_TABLET | Freq: Two times a day (BID) | ORAL | 2 refills | Status: DC

## 2021-02-01 NOTE — Telephone Encounter (Signed)
Refill sent.

## 2021-02-01 NOTE — Telephone Encounter (Signed)
° °*  STAT* If patient is at the pharmacy, call can be transferred to refill team.   1. Which medications need to be refilled? (please list name of each medication and dose if known) apixaban (ELIQUIS) 5 MG TABS tablet  2. Which pharmacy/location (including street and city if local pharmacy) is medication to be sent to? [facility] [address]  3. Do they need a 30 day or 90 day supply? 90 days

## 2021-02-07 ENCOUNTER — Other Ambulatory Visit: Payer: Self-pay | Admitting: Cardiovascular Disease

## 2021-04-15 ENCOUNTER — Other Ambulatory Visit: Payer: Self-pay | Admitting: Cardiovascular Disease

## 2021-05-08 ENCOUNTER — Other Ambulatory Visit: Payer: Self-pay | Admitting: Cardiovascular Disease

## 2021-05-17 ENCOUNTER — Telehealth: Payer: Self-pay | Admitting: Cardiovascular Disease

## 2021-05-17 NOTE — Telephone Encounter (Signed)
Wife of the patient called. The wife contacted William Preston this morning regarding his Eliquis application and was told that the application is still missing the page with [name] portion.  ? ?Please let the wife know what she needs to do next  ?

## 2021-05-17 NOTE — Telephone Encounter (Signed)
Contacted patient wife, she advised that she faxed over the paperwork to [place] on 04/24- they are missing Dr.Berry's portion. I advised I would send a message to his nurse- she was out of office today and would return tomorrow to review and see about sending that in.  ? ?Patient wife verbalized understanding, thankful for call. ? ?

## 2021-05-18 ENCOUNTER — Other Ambulatory Visit: Payer: Self-pay | Admitting: Cardiovascular Disease

## 2021-05-19 ENCOUNTER — Ambulatory Visit: Payer: Medicare Other

## 2021-05-19 ENCOUNTER — Encounter: Payer: Self-pay | Admitting: Psychology

## 2021-05-19 ENCOUNTER — Ambulatory Visit (INDEPENDENT_AMBULATORY_CARE_PROVIDER_SITE_OTHER): Payer: Medicare Other | Admitting: Psychology

## 2021-05-19 DIAGNOSIS — Z8616 Personal history of COVID-19: Secondary | ICD-10-CM | POA: Insufficient documentation

## 2021-05-19 DIAGNOSIS — R4189 Other symptoms and signs involving cognitive functions and awareness: Secondary | ICD-10-CM

## 2021-05-19 DIAGNOSIS — I482 Chronic atrial fibrillation, unspecified: Secondary | ICD-10-CM

## 2021-05-19 NOTE — Telephone Encounter (Signed)
Late entry: Pt assistance completed and signed. Fax to [employer] on 05/18/21. With confirmation of successful fax. ? ?Called pt's wife to let her know about this. Left message on voicemail.  ?

## 2021-05-19 NOTE — Progress Notes (Signed)
? ?  Psychometrician Note ?  ?Cognitive testing was administered to William Preston by Cruzita Lederer, B.S. (psychometrist) under the supervision of Dr. Christia Reading, Ph.D., licensed psychologist on 05/19/2021. William Preston did not appear overtly distressed by the testing session per behavioral observation or responses across self-report questionnaires. Rest breaks were offered.  ?  ?The battery of tests administered was selected by Dr. Christia Reading, Ph.D. with consideration to William Preston current level of functioning, the nature of his symptoms, emotional and behavioral responses during interview, level of literacy, observed level of motivation/effort, and the nature of the referral question. This battery was communicated to the psychometrist. Communication between Dr. Christia Reading, Ph.D. and the psychometrist was ongoing throughout the evaluation and Dr. Christia Reading, Ph.D. was immediately accessible at all times. Dr. Christia Reading, Ph.D. provided supervision to the psychometrist on the date of this service to the extent necessary to assure the quality of all services provided.  ?  ?William Preston will return within approximately 1-2 weeks for an interactive feedback session with William Preston at which time his test performances, clinical impressions, and treatment recommendations will be reviewed in detail. William Preston understands he can contact our office should he require our assistance before this time. ? ?A total of 135 minutes of billable time were spent face-to-face with William Preston by the psychometrist. This includes both test administration and scoring time. Billing for these services is reflected in the clinical report generated by Dr. Christia Reading, Ph.D. ? ?This note reflects time spent with the psychometrician and does not include test scores or any clinical interpretations made by William Preston. The full report will follow in a separate note.  ?

## 2021-05-19 NOTE — Progress Notes (Signed)
? ?NEUROPSYCHOLOGICAL EVALUATION ?[place]. [facility] ?[place] Department of Neurology ? ?Date of Evaluation: May 19, 2021 ? ?Reason for Referral:  ? ?William Preston is a 71 y.o. right-handed Caucasian male referred by  Star Age, M.D. , to characterize his current cognitive functioning and assist with diagnostic clarity and treatment planning in the context of subjective cognitive decline.  ? ?Assessment and Plan:  ? ?Clinical Impression(s): ?Mr. William Preston's pattern of performance is suggestive of a few isolated deficits, including a spatial cognitive flexibility task, across yes/no recognition trials throughout memory tasks, and motor functioning when using his non-dominant (left) hand. Performance variability was also exhibited across semantic fluency, as well as both encoding (i.e., learning) and retrieval aspects of memory. Performances were appropriate relative to age-matched peers across processing speed, attention/concentration, other executive functioning tasks, receptive language, phonemic fluency, confrontation naming, visuospatial abilities, and motor functioning using his dominant (right) hand. [name] denied difficulties completing instrumental activities of daily living (ADLs) independently. At the present time, I do not feel that Mr. William Preston quite meets diagnostic criteria for a formal cognitive disorder diagnosis. However, he is closer to the cut-off of where a mild cognitive impairment conceptualization could be argued. ? ?The etiology for isolated weaknesses and performance variability is unclear. Across memory tasks, poor discriminability across yes/no recognition tasks represented his only consistent weakness. His performances suggest difficulty differentiating sources of information as nearly all false positive errors were information presented to him at some point or very similar to correct information. I do not believe that scores currently suggest rapid forgetting or a memory  storage deficit, especially as retention percentages at a delay were appropriate throughout. Current memory scores are not consistent with Alzheimer's disease. However, given said variability and he and his wife's report of slow decline since 2014, monitoring over time will be important to see if further decline occurs.  ? ?Said variability could certainly be influenced by vascular causes as he has several conditions in his medical history (e.g., hypertension, atrial fibrillation, aortic stenosis, dyslipidemia, type II diabetes). His recent brain MRI revealed age-appropriate vascular changes and no prior stroke history. Cognitive and behavioral characteristics are not concerning for Lewy body dementia, frontotemporal dementia, or another more rare parkinsonian condition. ? ?Curiously, there is some evidence to suggest localized mild right frontal dysfunction. This would be evidenced by isolated weaknesses across a spatial sequencing task Naval architect Fluency, condition 3) and diminished motor abilities when using his left hand. The clinical significance of these findings remains unclear at the present time. Continued medical monitoring will be important moving forward. ? ?Recommendations: ?A repeat neuropsychological evaluation in 18-24 months (or sooner if functional decline is noted) is recommended to assess the trajectory of future cognitive decline should it occur. This will also aid in future efforts towards improved diagnostic clarity. ? ?[name] is encouraged to attend to lifestyle factors for brain health (e.g., regular physical exercise, good nutrition habits, regular participation in cognitively-stimulating activities, and general stress management techniques), which are likely to have benefits for both emotional adjustment and cognition. Optimal control of vascular risk factors (including safe cardiovascular exercise and adherence to dietary recommendations) is encouraged. Likewise, continued  compliance with his CPAP machine will also be important. Continued participation in activities which provide mental stimulation and social interaction is also recommended.  ? ?When learning new information, he would benefit from information being broken up into small, manageable pieces. He may also find it helpful to articulate the material in his own words  and in a context to promote encoding at the onset of a new task. This material may need to be repeated multiple times to promote encoding. ? ?Memory can be improved using internal strategies such as rehearsal, repetition, chunking, mnemonics, association, and imagery. External strategies such as written notes in a consistently used memory journal, visual and nonverbal auditory cues such as a calendar on the refrigerator or appointments with alarm, such as on a cell phone, can also help maximize recall.   ? ?To address problems with processing speed, he may wish to consider: ?  -Ensuring that he is alerted when essential material or instructions are being presented ?  -Adjusting the speed at which new information is presented ?  -Allowing for more time in comprehending, processing, and responding in conversation ? ?To address problems with fluctuating attention, he may wish to consider: ?  -Avoiding external distractions when needing to concentrate ?  -Limiting exposure to fast paced environments with multiple sensory demands ?  -Writing down complicated information and using checklists ?  -Attempting and completing one task at a time (i.e., no multi-tasking) ?  -Verbalizing aloud each step of a task to maintain focus ?  -Reducing the amount of information considered at one time ? ?Review of Records:  ? ?Mr. William Preston was seen by [facility] Star Age, M.D.) on 09/01/2020 for an evaluation of confusion and staring spells. Briefly, they reported that towards the end of May, Mr. William Preston had a couple episodes of confusion, zoning out, and starting  blankly into space. Several days later, he started experiencing swelling in one of his fingers and was ultimately treated for cellulitis with a 10-day course of clindamycin. A similar cellulitis type change in his right toe was also noted afterwards. Regarding cognition, primary concerns surrounded short-term memory loss, with dysfunction noted as far back as 2016 if not earlier. Trouble with ADLs was denied. Performance on a brief cognitive screening instrument (MMSE) was 26/30. Ultimately, Mr. Mccready was referred for a comprehensive neuropsychological evaluation to characterize his cognitive abilities and to assist with diagnostic clarity and treatment planning.  ? ?Brain MRI on 07/06/2020 was unremarkable with age-appropriate findings. EEG on 09/14/2020 was unremarkable.  ? ?Past Medical History:  ?Diagnosis Date  ? Cardiac murmur 11/06/2012  ? Chronic atrial fibrillation 04/20/2008  ? Dyslipidemia 11/06/2012  ? Essential hypertension, benign 04/20/2008  ? History of colonic polyps 12/12/2020  ? History of COVID-19   ? Long term (current) use of anticoagulants 04/01/2012  ? Mild aortic stenosis 09/02/2018  ? ECHO in august 2021  ? Obstructive sleep apnea 04/20/2008  ? nightly CPAP use  ? Type II diabetes mellitus 11/06/2012  ?  ?Past Surgical History:  ?Procedure Laterality Date  ? CARDIAC CATHETERIZATION  11/2003  ? normal  ? CARDIOVERSION  05/25/2008  ? successful  ? CATARACT EXTRACTION Bilateral   ? COLONOSCOPY N/A 06/20/2017  ? Surgeon: Danie Binder, MD; 6 polyps removed (tubular adenomas), diverticulosis in the descending, sigmoid, and rectosigmoid colon, external and internal hemorrhoids, redundant left colon.  Recommended repeat in 3 years.  ? COLONOSCOPY WITH PROPOFOL N/A 01/30/2021  ? Procedure: COLONOSCOPY WITH PROPOFOL;  Surgeon: Eloise Harman, DO;  Location: [facility];  Service: Endoscopy;  Laterality: N/A;  7:30am  ? NM MYOCAR PERF WALL MOTION  10/29/2007  ? low risk  ? POLYPECTOMY   06/20/2017  ? Procedure: POLYPECTOMY;  Surgeon: Danie Binder, MD;  Location: [facility];  Service: Endoscopy;;  ascending colon  x3, transverse colon polyp (cb)  ? POLYPECTOMY  01/30/2021  ? Procedure: POLYPECTOMY;

## 2021-05-26 ENCOUNTER — Encounter: Payer: Medicare Other | Admitting: Psychology

## 2021-05-29 ENCOUNTER — Ambulatory Visit (INDEPENDENT_AMBULATORY_CARE_PROVIDER_SITE_OTHER): Payer: Medicare Other | Admitting: Psychology

## 2021-05-29 DIAGNOSIS — R4189 Other symptoms and signs involving cognitive functions and awareness: Secondary | ICD-10-CM

## 2021-05-29 DIAGNOSIS — I482 Chronic atrial fibrillation, unspecified: Secondary | ICD-10-CM | POA: Diagnosis not present

## 2021-05-29 NOTE — Progress Notes (Signed)
? ?  Neuropsychology Feedback Session ?[place]. [facility] ?[place] Department of Neurology ? ?Reason for Referral:  ? ?William Preston is a 71 y.o. right-handed Caucasian male referred by  Star Age, M.D. , to characterize his current cognitive functioning and assist with diagnostic clarity and treatment planning in the context of subjective cognitive decline.  ? ?Feedback:  ? ?William Preston completed a comprehensive neuropsychological evaluation on 05/19/2021. Please refer to that encounter for the full report and recommendations. Briefly, results suggested a few isolated deficits, including a spatial cognitive flexibility task, across yes/no recognition trials throughout memory tasks, and motor functioning when using his non-dominant (left) hand. Performance variability was also exhibited across semantic fluency, as well as both encoding (i.e., learning) and retrieval aspects of memory. The etiology for isolated weaknesses and performance variability is unclear. Across memory tasks, poor discriminability across yes/no recognition tasks represented his only consistent weakness. His performances suggest difficulty differentiating sources of information as nearly all false positive errors were information presented to him at some point or very similar to correct information. I do not believe that scores currently suggest rapid forgetting or a memory storage deficit, especially as retention percentages at a delay were appropriate throughout. Current memory scores are not consistent with Alzheimer's disease. However, given said variability and he and his wife's report of slow decline since 2014, monitoring over time will be important to see if further decline occurs.  ? ?William Preston was accompanied by his wife during the current feedback session. Content of the current session focused on the results of his neuropsychological evaluation. William Preston was given the opportunity to ask questions and his questions were  answered. He was encouraged to reach out should additional questions arise. A copy of his report was provided at the conclusion of the visit.  ? ?  ? ?30 minutes were spent conducting the current feedback session with William Preston, billed as one unit [phone].  ?

## 2021-07-04 ENCOUNTER — Encounter: Payer: Medicare Other | Admitting: Psychology

## 2021-07-26 ENCOUNTER — Other Ambulatory Visit: Payer: Self-pay | Admitting: Cardiovascular Disease

## 2021-07-26 MED ORDER — PRAVASTATIN SODIUM 80 MG PO TABS: 80.0000 mg | ORAL_TABLET | Freq: Every day | ORAL | 0 refills | Status: DC

## 2021-07-31 ENCOUNTER — Ambulatory Visit (HOSPITAL_COMMUNITY): Payer: Medicare Other | Attending: Internal Medicine

## 2021-07-31 DIAGNOSIS — I350 Nonrheumatic aortic (valve) stenosis: Secondary | ICD-10-CM | POA: Diagnosis present

## 2021-07-31 LAB — ECHOCARDIOGRAM COMPLETE
AR max vel: 1.02 cm2
AV Area VTI: 1.03 cm2
AV Area mean vel: 0.99 cm2
AV Mean grad: 19 mmHg
AV Peak grad: 30.7 mmHg
Ao pk vel: 2.77 m/s
Area-P 1/2: 3.68 cm2
S' Lateral: 3.1 cm

## 2021-08-09 ENCOUNTER — Other Ambulatory Visit: Payer: Self-pay

## 2021-08-09 DIAGNOSIS — I350 Nonrheumatic aortic (valve) stenosis: Secondary | ICD-10-CM

## 2021-08-23 ENCOUNTER — Other Ambulatory Visit: Payer: Self-pay | Admitting: Cardiovascular Disease

## 2021-09-13 ENCOUNTER — Encounter: Payer: Self-pay | Admitting: Cardiovascular Disease

## 2021-09-13 ENCOUNTER — Ambulatory Visit: Payer: Medicare Other | Attending: Cardiovascular Disease | Admitting: Cardiovascular Disease

## 2021-09-13 DIAGNOSIS — I350 Nonrheumatic aortic (valve) stenosis: Secondary | ICD-10-CM

## 2021-09-13 DIAGNOSIS — G4733 Obstructive sleep apnea (adult) (pediatric): Secondary | ICD-10-CM | POA: Diagnosis not present

## 2021-09-13 DIAGNOSIS — I482 Chronic atrial fibrillation, unspecified: Secondary | ICD-10-CM

## 2021-09-13 DIAGNOSIS — E785 Hyperlipidemia, unspecified: Secondary | ICD-10-CM | POA: Diagnosis not present

## 2021-09-13 DIAGNOSIS — I10 Essential (primary) hypertension: Secondary | ICD-10-CM | POA: Diagnosis not present

## 2021-09-13 MED ORDER — PRAVASTATIN SODIUM 80 MG PO TABS: 80.0000 mg | ORAL_TABLET | Freq: Every day | ORAL | 3 refills | Status: DC

## 2021-09-13 MED ORDER — METOPROLOL TARTRATE 25 MG PO TABS: 25.0000 mg | ORAL_TABLET | Freq: Every day | ORAL | 3 refills | Status: DC

## 2021-09-13 MED ORDER — DIGOXIN 250 MCG PO TABS: ORAL_TABLET | ORAL | 3 refills | Status: DC

## 2021-09-13 MED ORDER — DILTIAZEM HCL ER 240 MG PO CP24: 240.0000 mg | ORAL_CAPSULE | Freq: Every day | ORAL | 3 refills | Status: DC

## 2021-09-13 MED ORDER — LOSARTAN POTASSIUM 50 MG PO TABS: 50.0000 mg | ORAL_TABLET | Freq: Every day | ORAL | 3 refills | Status: DC

## 2021-09-13 MED ORDER — APIXABAN 5 MG PO TABS: 5.0000 mg | ORAL_TABLET | Freq: Two times a day (BID) | ORAL | 3 refills | Status: DC

## 2021-09-13 NOTE — Progress Notes (Signed)
09/13/2021 William Preston   1950-06-16  [id number]  Primary Physician Andres Shad, MD Primary Cardiologist: Lorretta Harp MD Lupe Carney, [place]  HPI:  William Preston is a 71 y.o.  moderately-overweight married Caucasian male, father of 50, whom I last saw in the office  09/03/2020. He has a history of chronic AFib, rate controlled, on Coumadin anticoagulation with INRs in the 2 range followed here in the office by Erasmo Downer. He has had 2 failed attempts at cardioversion in the past. He has normal coronary arteries and normal LV function by catheterization performed by me May 25, 2008. His other problems include obstructive sleep apnea and on CPAP, hypertension, hyperlipidemia, and type 2 diabetes. He denies chest pain or shortness of breath. Since I seen him a year ago he's remained currently stable. He had a bladder stone surgically removed which went without complication. He did stop his Coumadin 5 days prior. Since I saw him last he's remained currently stable.  He and his wife had a 3-week trip to the [place] in [place], [place] and [place] and are planning a trip to the [employer] [place] in 2019.[place]    He is planning a trip to [place] this coming fall.[place] Kitchen   He did have COVID-19 twice, once in March once on August 13 2019 he did have some headache and some myalgias.  He did transition to Eliquis from Coumadin.    Since I saw him a year ago he did go to [place] in [place].  He is planning a trip to [place] in the upcoming future.  He is completely asymptomatic.   Current Meds  Medication Sig   apixaban (ELIQUIS) 5 MG TABS tablet Take 1 tablet (5 mg total) by mouth 2 (two) times daily.   Ascorbic Acid (VITAMIN C) 1000 MG tablet Take 1,000 mg by mouth daily.   cholecalciferol (VITAMIN D) 1000 units tablet Take 1,000 Units by mouth daily.   digoxin (LANOXIN) 0.25 MG tablet Take 1/2 (one-half) tablet by mouth once daily    diltiazem (CARDIZEM CD) 240 MG 24 hr capsule Take 1 capsule (240 mg total) by mouth daily.   diltiazem (DILT-XR) 240 MG 24 hr capsule Take 1 capsule by mouth once daily   doxazosin (CARDURA) 4 MG tablet Take 4 mg by mouth daily.   finasteride (PROSCAR) 5 MG tablet Take 5 mg by mouth daily.   furosemide (LASIX) 40 MG tablet TAKE 1/2 (ONE-HALF) TABLET BY MOUTH ONCE DAILY AS NEEDED   glyBURIDE-metformin (GLUCOVANCE) 2.5-500 MG per tablet Take 1 tablet by mouth 3 (three) times daily with meals.    losartan (COZAAR) 50 MG tablet Take 1 tablet (50 mg total) by mouth daily.   metoprolol tartrate (LOPRESSOR) 25 MG tablet Take 1 tablet by mouth once daily   Multiple Vitamins-Minerals (MULTIVITAMIN WITH MINERALS) tablet Take 1 tablet by mouth daily.   Multiple Vitamins-Minerals (PRESERVISION AREDS 2 PO) Take 1 capsule by mouth in the morning and at bedtime.   pravastatin (PRAVACHOL) 80 MG tablet Take 1 tablet (80 mg total) by mouth daily.   vitamin B-12 (CYANOCOBALAMIN) 1000 MCG tablet Take 1,000 mcg by mouth daily.   vitamin E 400 UNIT capsule Take 400 Units by mouth daily.     No Known Allergies  Social History   Socioeconomic History   Marital status: Married    Spouse name: Not on file   Number of children: Not on file  Years of education: 64   Highest education level: Some college, no degree  Occupational History   Occupation: Retired    Comment: [employer] [place]   Smoking status: Never   Smokeless tobacco: Never  Vaping Use   Vaping Use: Never used  Substance and Sexual Activity   Alcohol use: No   Drug use: No   Sexual activity: Yes  Other Topics Concern   Not on file  Social History Narrative   Not on file   Social Determinants of Health   Financial Resource Strain: Not on file  Food Insecurity: Not on file  Transportation Needs: Not on file  Physical Activity: Not on file  Stress: Not on file  Social Connections: Not on file  Intimate Partner  Violence: Not on file     Review of Systems: General: negative for chills, fever, night sweats or weight changes.  Cardiovascular: negative for chest pain, dyspnea on exertion, edema, orthopnea, palpitations, paroxysmal nocturnal dyspnea or shortness of breath Dermatological: negative for rash Respiratory: negative for cough or wheezing Urologic: negative for hematuria Abdominal: negative for nausea, vomiting, diarrhea, bright red blood per rectum, melena, or hematemesis Neurologic: negative for visual changes, syncope, or dizziness All other systems reviewed and are otherwise negative except as noted above.    Blood pressure 120/78, pulse 85, height '5\' 9"'$  (1.753 m), weight 207 lb (93.9 kg).  General appearance: alert and no distress Neck: no adenopathy, no JVD, supple, symmetrical, trachea midline, thyroid not enlarged, symmetric, no tenderness/mass/nodules, and bilateral carotid bruits Lungs: clear to auscultation bilaterally Heart: irregularly irregular rhythm and 2/6 systolic ejection murmur at the base consistent with aortic stenosis Extremities: extremities normal, atraumatic, no cyanosis or edema Pulses: 2+ and symmetric Skin: Skin color, texture, turgor normal. No rashes or lesions Neurologic: Grossly normal  EKG atrial fibrillation with a ventricular sponsor of 85 and septal Q waves.  I personally reviewed this EKG.  ASSESSMENT AND PLAN:   Essential hypertension, benign History of essential hypertension a blood pressure measured today at 120/78.  He is on diltiazem losartan and metoprolol.  Chronic atrial fibrillation (HCC) History of persistent A-fib rate controlled on Eliquis oral anticoagulation.  Dyslipidemia History of dyslipidemia on statin therapy with recent lipid profile performed by his PCP 07/20/2021 revealing total cholesterol 151, LDL of 82 and HDL 38  Obstructive sleep apnea History of obstructive sleep apnea on CPAP  Mild aortic stenosis History of mild  aortic stenosis which is progressed to moderate by recent echo performed 07/31/2021.  His valve area is now 1.03 cm with a peak gradient of 30 mmHg.  He is currently asymptomatic.  We will continue to follow this on an annual basis.     Lorretta Harp MD FACP,FACC,FAHA, [facility] 09/13/2021 2:21 PM

## 2021-09-13 NOTE — Assessment & Plan Note (Signed)
History of mild aortic stenosis which is progressed to moderate by recent echo performed 07/31/2021.  His valve area is now 1.03 cm with a peak gradient of 30 mmHg.  He is currently asymptomatic.  We will continue to follow this on an annual basis.

## 2021-09-13 NOTE — Assessment & Plan Note (Signed)
History of dyslipidemia on statin therapy with recent lipid profile performed by his PCP 07/20/2021 revealing total cholesterol 151, LDL of 82 and HDL 38

## 2021-09-13 NOTE — Assessment & Plan Note (Signed)
History of essential hypertension a blood pressure measured today at 120/78.  He is on diltiazem losartan and metoprolol.

## 2021-09-13 NOTE — Assessment & Plan Note (Signed)
History of obstructive sleep apnea on CPAP. 

## 2021-09-13 NOTE — Patient Instructions (Signed)
Medication Instructions:  Your physician recommends that you continue on your current medications as directed. Please refer to the Current Medication list given to you today.  *If you need a refill on your cardiac medications before your next appointment, please call your pharmacy*   Testing/Procedures: Your physician has requested that you have a carotid duplex. This test is an ultrasound of the carotid arteries in your neck. It looks at blood flow through these arteries that supply the brain with blood. Allow one hour for this exam. There are no restrictions or special instructions. This procedure will be done at [address]  Your physician has requested that you have an echocardiogram. Echocardiography is a painless test that uses sound waves to create images of your heart. It provides your doctor with information about the size and shape of your heart and how well your heart's chambers and valves are working. This procedure takes approximately one hour. There are no restrictions for this procedure. To be done in July 2024. This procedure will be done at [address]   Follow-Up: At [facility], you and your health needs are our priority.  As part of our continuing mission to provide you with exceptional heart care, we have created designated Provider Care Teams.  These Care Teams include your primary Cardiologist (physician) and Advanced Practice Providers (APPs -  Physician Assistants and Nurse Practitioners) who all work together to provide you with the care you need, when you need it.  We recommend signing up for the patient portal called "MyChart".  Sign up information is provided on this After Visit Summary.  MyChart is used to connect with patients for Virtual Visits (Telemedicine).  Patients are able to view lab/test results, encounter notes, upcoming appointments, etc.  Non-urgent messages can be sent to your provider as well.   To learn more about  what you can do with MyChart, go to [email or web address].    Your next appointment:   12 month(s)  The format for your next appointment:   In Person  Provider:   Quay Burow, MD

## 2021-09-13 NOTE — Assessment & Plan Note (Signed)
History of persistent A-fib rate controlled on Eliquis oral anticoagulation. 

## 2021-09-22 ENCOUNTER — Other Ambulatory Visit: Payer: Self-pay | Admitting: Cardiovascular Disease

## 2021-10-03 ENCOUNTER — Encounter: Payer: Self-pay | Admitting: Cardiovascular Disease

## 2021-10-06 ENCOUNTER — Ambulatory Visit (HOSPITAL_COMMUNITY)
Admission: RE | Admit: 2021-10-06 | Discharge: 2021-10-06 | Disposition: A | Discharge status: Home / Self Care | Payer: Medicare Other | Source: Ambulatory Visit | Attending: Cardiovascular Disease | Admitting: Cardiovascular Disease

## 2021-10-06 DIAGNOSIS — I482 Chronic atrial fibrillation, unspecified: Secondary | ICD-10-CM | POA: Diagnosis present

## 2021-10-06 DIAGNOSIS — I10 Essential (primary) hypertension: Secondary | ICD-10-CM | POA: Diagnosis present

## 2021-10-06 DIAGNOSIS — R55 Syncope and collapse: Secondary | ICD-10-CM | POA: Diagnosis not present

## 2021-10-06 DIAGNOSIS — G4733 Obstructive sleep apnea (adult) (pediatric): Secondary | ICD-10-CM | POA: Diagnosis present

## 2021-10-06 DIAGNOSIS — E785 Hyperlipidemia, unspecified: Secondary | ICD-10-CM | POA: Diagnosis present

## 2021-10-06 DIAGNOSIS — I350 Nonrheumatic aortic (valve) stenosis: Secondary | ICD-10-CM | POA: Diagnosis present

## 2021-10-20 ENCOUNTER — Other Ambulatory Visit: Payer: Self-pay | Admitting: Cardiovascular Disease

## 2021-10-24 ENCOUNTER — Other Ambulatory Visit: Payer: Self-pay | Admitting: Cardiovascular Disease

## 2021-10-31 ENCOUNTER — Encounter: Payer: Self-pay | Admitting: Cardiovascular Disease

## 2021-10-31 MED ORDER — PRAVASTATIN SODIUM 80 MG PO TABS: 80.0000 mg | ORAL_TABLET | Freq: Every day | ORAL | 3 refills | Status: DC

## 2022-04-13 LAB — LAB REPORT - SCANNED
A1c: 6.9
EGFR: 90

## 2022-04-24 ENCOUNTER — Encounter: Payer: Self-pay | Admitting: Cardiovascular Disease

## 2022-05-07 MED ORDER — APIXABAN 5 MG PO TABS: 5.0000 mg | ORAL_TABLET | Freq: Two times a day (BID) | ORAL | 3 refills | Status: DC

## 2022-08-10 ENCOUNTER — Ambulatory Visit (HOSPITAL_COMMUNITY): Payer: Medicare Other | Attending: Cardiovascular Disease

## 2022-08-10 DIAGNOSIS — I350 Nonrheumatic aortic (valve) stenosis: Secondary | ICD-10-CM | POA: Diagnosis not present

## 2022-08-10 LAB — ECHOCARDIOGRAM COMPLETE
AR max vel: 0.65 cm2
AV Area VTI: 0.7 cm2
AV Area mean vel: 0.7 cm2
AV Mean grad: 26 mmHg
AV Peak grad: 48.8 mmHg
Ao pk vel: 3.49 m/s
Area-P 1/2: 4.24 cm2
S' Lateral: 2.9 cm

## 2022-09-18 ENCOUNTER — Ambulatory Visit: Payer: Medicare Other | Admitting: Cardiovascular Disease

## 2022-09-21 ENCOUNTER — Ambulatory Visit: Payer: Medicare Other | Attending: Cardiovascular Disease | Admitting: Cardiovascular Disease

## 2022-09-21 ENCOUNTER — Encounter: Payer: Self-pay | Admitting: Cardiovascular Disease

## 2022-09-21 VITALS — BP 120/60 | HR 75 | Ht 69.0 in | Wt 200.0 lb

## 2022-09-21 DIAGNOSIS — I482 Chronic atrial fibrillation, unspecified: Secondary | ICD-10-CM | POA: Diagnosis present

## 2022-09-21 DIAGNOSIS — G4733 Obstructive sleep apnea (adult) (pediatric): Secondary | ICD-10-CM | POA: Insufficient documentation

## 2022-09-21 DIAGNOSIS — I350 Nonrheumatic aortic (valve) stenosis: Secondary | ICD-10-CM | POA: Diagnosis present

## 2022-09-21 DIAGNOSIS — I10 Essential (primary) hypertension: Secondary | ICD-10-CM | POA: Diagnosis present

## 2022-09-21 MED ORDER — LOSARTAN POTASSIUM 50 MG PO TABS: 50.0000 mg | ORAL_TABLET | Freq: Every day | ORAL | 3 refills | Status: DC

## 2022-09-21 MED ORDER — DIGOXIN 250 MCG PO TABS: ORAL_TABLET | ORAL | 3 refills | Status: DC

## 2022-09-21 MED ORDER — METOPROLOL TARTRATE 25 MG PO TABS: 25.0000 mg | ORAL_TABLET | Freq: Every day | ORAL | 0 refills | Status: DC

## 2022-09-21 MED ORDER — PRAVASTATIN SODIUM 80 MG PO TABS: 80.0000 mg | ORAL_TABLET | Freq: Every day | ORAL | 3 refills | Status: DC

## 2022-09-21 MED ORDER — FUROSEMIDE 40 MG PO TABS: ORAL_TABLET | ORAL | 0 refills | Status: DC

## 2022-09-21 MED ORDER — DILTIAZEM HCL ER 240 MG PO CP24: 240.0000 mg | ORAL_CAPSULE | Freq: Every day | ORAL | 3 refills | Status: DC

## 2022-09-21 NOTE — Progress Notes (Signed)
09/21/2022 Tawni Millers [place]   11/09/1950  [id number]  Primary Physician [name], MD Primary Cardiologist: Runell Gess MD Nicholes Calam[place], [place]  HPI:  William Preston is a 72 y.o.    moderately-overweight married Caucasian male, father of 3, whom I last saw in the office 09/13/2021. He has a history of chronic AFib, rate controlled, on Eliquis anticoagulation.  He is accompanied by his wife Marvia Pickles [place] [address] ago today.  He has had 2 failed attempts at cardioversion in the past. He has normal coronary arteries and normal LV function by catheterization performed by me May 25, 2008. His other problems include obstructive sleep apnea and on CPAP, hypertension, hyperlipidemia, and type 2 diabetes. He denies chest pain or shortness of breath. Since I seen him a year ago he's remained currently stable. He had a bladder stone surgically removed which went without complication. He did stop his Coumadin 5 days prior. Since I saw him last he's remained currently stable.  He and his wife had a 3-week trip to the [address] in 2018, [place] and [place] and are planning a trip to the [employer] parks in 2019.[place] Kitchen    He is planning a trip to [place] this coming fall.Mar[place] Kitchen   He did have COVID-19 twice, once in March once on August 13 2019 he did have some headache and some myalgias.  He did transition to Eliquis from Coumadin.     Since I saw him a year ago he did go to the [place] in [place] and had an episode of syncope at [employer] probably from dehydration.  Otherwise, he denies chest pain or shortness of breath.  His most recent 2D echo performed 08/10/2022 has shown progression of the aortic stenosis from mild to now moderate.   Current Meds  Medication Sig   apixaban (ELIQUIS) 5 MG TABS tablet Take 1 tablet (5 mg total) by mouth 2 (two) times daily.   Ascorbic Acid (VITAMIN C) 1000 MG tablet Take 1,000 mg by mouth daily.   cholecalciferol (VITAMIN D)  1000 units tablet Take 1,000 Units by mouth daily.   digoxin (LANOXIN) 0.25 MG tablet Take 1/2 (one-half) tablet by mouth once daily   diltiazem (CARDIZEM CD) 240 MG 24 hr capsule Take 1 capsule (240 mg total) by mouth daily.   diltiazem (DILT-XR) 240 MG 24 hr capsule Take 1 capsule (240 mg total) by mouth daily.   doxazosin (CARDURA) 4 MG tablet Take 4 mg by mouth daily.   finasteride (PROSCAR) 5 MG tablet Take 5 mg by mouth daily.   glyBURIDE (DIABETA) 2.5 MG tablet Take 2.5 mg by mouth 3 (three) times daily.   glyBURIDE-metformin (GLUCOVANCE) 2.5-500 MG per tablet Take 1 tablet by mouth 3 (three) times daily with meals.    losartan (COZAAR) 50 MG tablet Take 1 tablet (50 mg total) by mouth daily.   metFORMIN (GLUCOPHAGE) 500 MG tablet Take 500 mg by mouth 3 (three) times daily.   metoprolol tartrate (LOPRESSOR) 25 MG tablet Take 1 tablet by mouth once daily   Multiple Vitamins-Minerals (MULTIVITAMIN WITH MINERALS) tablet Take 1 tablet by mouth daily.   Multiple Vitamins-Minerals (PRESERVISION AREDS 2 PO) Take 1 capsule by mouth in the morning and at bedtime.   pravastatin (PRAVACHOL) 80 MG tablet Take 1 tablet (80 mg total) by mouth daily.   vitamin B-12 (CYANOCOBALAMIN) 1000 MCG tablet Take 1,000 mcg by mouth daily.   vitamin E 400 UNIT  capsule Take 400 Units by mouth daily.   [DISCONTINUED] furosemide (LASIX) 40 MG tablet TAKE 1/2 (ONE-HALF) TABLET BY MOUTH ONCE DAILY AS NEEDED     No Known Allergies  Social History   Socioeconomic History   Marital status: Married    Spouse name: Not on file   Number of children: Not on file   Years of education: 14   Highest education level: Some college, no degree  Occupational History   Occupation: Retired    Comment: [employer] in [employer]  Tobacco Use   Smoking status: Never   Smokeless tobacco: Never  Vaping Use   Vaping status: Never Used  Substance and Sexual Activity   Alcohol use: No   Drug use: No   Sexual activity: Yes   Other Topics Concern   Not on file  Social History Narrative   Not on file   Social Determinants of Health   Financial Resource Strain: Not on file  Food Insecurity: Not on file  Transportation Needs: Not on file  Physical Activity: Not on file  Stress: Not on file  Social Connections: Not on file  Intimate Partner Violence: Not on file     Review of Systems: General: negative for chills, fever, night sweats or weight changes.  Cardiovascular: negative for chest pain, dyspnea on exertion, edema, orthopnea, palpitations, paroxysmal nocturnal dyspnea or shortness of breath Dermatological: negative for rash Respiratory: negative for cough or wheezing Urologic: negative for hematuria Abdominal: negative for nausea, vomiting, diarrhea, bright red blood per rectum, melena, or hematemesis Neurologic: negative for visual changes, syncope, or dizziness All other systems reviewed and are otherwise negative except as noted above.    Blood pressure 120/60, pulse 75, height 5\' 9"  (1.753 m), weight 200 lb (90.7 kg), SpO2 95%.  General appearance: alert and no distress Neck: no adenopathy, no JVD, supple, symmetrical, trachea midline, thyroid not enlarged, symmetric, no tenderness/mass/nodules, and bilateral carotid bruits versus transmitted murmur. Lungs: clear to auscultation bilaterally Heart: irregularly irregular rhythm and 2/6 outflow tract murmur consistent with aortic stenosis. Extremities: 1-2+ left ankle edema. Pulses: 2+ and symmetric Skin: Skin color, texture, turgor normal. No rashes or lesions Neurologic: Grossly normal  EKG EKG Interpretation Date/Time:  Friday September 21 2022 10:21:37 EDT Ventricular Rate:  75 PR Interval:    QRS Duration:  92 QT Interval:  350 QTC Calculation: 390 R Axis:   14  Text Interpretation: Atrial fibrillation Anterior infarct (cited on or before 24-Jan-2009) When compared with ECG of 24-Jan-2009 14:50, Questionable change in initial  forces of Anterior leads Confirmed by Nanetta Batty [id number]) on 09/21/2022 10:40:12 AM    ASSESSMENT AND PLAN:   Essential hypertension, benign History of essential hypertension with blood pressure measured today at 120/60.  He is on diltiazem, losartan and metoprolol.  Chronic atrial fibrillation (HCC) History of persistent A-fib rate controlled on Eliquis oral anticoagulation.  Dyslipidemia History of lipidemia on statin therapy with lipid profile performed by his PCP 04/12/2022 revealing a total cholesterol of 147, LDL 76 and HDL of 38.  Obstructive sleep apnea History of obstructive sleep apnea on CPAP which he benefits from.  Mild aortic stenosis History of mild aortic stenosis which has progressed to moderate by his most recent 2D echo performed 08/10/2022.  LV function was normal.  He had dilated atria bilaterally and an aortic valve his mean gradient has increased from 20 to 26 mm with a valve area of 0.7 cm.  He is essentially asymptomatic from this.  Will continue to  follow this on annual basis.     Runell Gess MD FACP,FACC,FAHA, [facility] 09/21/2022 10:54 AM

## 2022-09-21 NOTE — Patient Instructions (Signed)
Medication Instructions:  Your physician recommends that you continue on your current medications as directed. Please refer to the Current Medication list given to you today.  *If you need a refill on your cardiac medications before your next appointment, please call your pharmacy*  Testing/Procedures: ECHO in July of 2025 Your physician has requested that you have an echocardiogram. Echocardiography is a painless test that uses sound waves to create images of your heart. It provides your doctor with information about the size and shape of your heart and how well your heart's chambers and valves are working. This procedure takes approximately one hour. There are no restrictions for this procedure. Please do NOT wear cologne, perfume, aftershave, or lotions (deodorant is allowed). Please arrive 15 minutes prior to your appointment time.  Follow-Up: At [facility], you and your health needs are our priority.  As part of our continuing mission to provide you with exceptional heart care, we have created designated Provider Care Teams.  These Care Teams include your primary Cardiologist (physician) and Advanced Practice Providers (APPs -  Physician Assistants and Nurse Practitioners) who all work together to provide you with the care you need, when you need it.  Your next appointment:   1 year(s)  Provider:   Nanetta Batty, MD

## 2022-09-21 NOTE — Assessment & Plan Note (Signed)
History of persistent A-fib rate controlled on Eliquis oral anticoagulation. 

## 2022-09-21 NOTE — Assessment & Plan Note (Signed)
History of lipidemia on statin therapy with lipid profile performed by his PCP 04/12/2022 revealing a total cholesterol of 147, LDL 76 and HDL of 38.

## 2022-09-21 NOTE — Assessment & Plan Note (Signed)
History of essential hypertension with blood pressure measured today at 120/60.  He is on diltiazem, losartan and metoprolol.

## 2022-09-21 NOTE — Assessment & Plan Note (Signed)
History of obstructive sleep apnea on CPAP which he benefits from 

## 2022-09-21 NOTE — Assessment & Plan Note (Signed)
History of mild aortic stenosis which has progressed to moderate by his most recent 2D echo performed 08/10/2022.  LV function was normal.  He had dilated atria bilaterally and an aortic valve his mean gradient has increased from 20 to 26 mm with a valve area of 0.7 cm.  He is essentially asymptomatic from this.  Will continue to follow this on annual basis.

## 2022-09-27 ENCOUNTER — Encounter: Payer: Self-pay | Admitting: Cardiovascular Disease

## 2022-09-27 DIAGNOSIS — I482 Chronic atrial fibrillation, unspecified: Secondary | ICD-10-CM

## 2022-09-27 DIAGNOSIS — I350 Nonrheumatic aortic (valve) stenosis: Secondary | ICD-10-CM

## 2022-09-27 DIAGNOSIS — I10 Essential (primary) hypertension: Secondary | ICD-10-CM

## 2022-09-28 MED ORDER — FUROSEMIDE 40 MG PO TABS: 40.0000 mg | ORAL_TABLET | Freq: Every day | ORAL | 3 refills | Status: DC

## 2023-01-27 ENCOUNTER — Other Ambulatory Visit: Payer: Self-pay | Admitting: Cardiovascular Disease

## 2023-05-28 ENCOUNTER — Telehealth: Payer: Self-pay | Admitting: Cardiovascular Disease

## 2023-05-28 MED ORDER — APIXABAN 5 MG PO TABS: 5.0000 mg | ORAL_TABLET | Freq: Two times a day (BID) | ORAL | 1 refills | Status: DC

## 2023-05-28 NOTE — Telephone Encounter (Signed)
*  STAT* If patient is at the pharmacy, call can be transferred to refill team.   1. Which medications need to be refilled? (please list name of each medication and dose if known) apixaban  (ELIQUIS ) 5 MG TABS tablet   2. Which pharmacy/location (including street and city if local pharmacy) is medication to be sent to?  [facility] - [place], [place] - [address]    3. Do they need a 30 day or 90 day supply? 90

## 2023-05-28 NOTE — Addendum Note (Signed)
 Addended by: Lia Rede on: 05/28/2023 03:24 PM   Modules accepted: Orders

## 2023-05-28 NOTE — Telephone Encounter (Signed)
 Prescription refill request for Eliquis  received. Indication: AF Last office visit:  09/21/22  Aleda Ammon MD Scr: 0.92 on 04/29/23  [facility] Age: 73 Weight: 90.7kg  Based on above findings Eliquis  5mg  twice daily is the appropriate dose.  Refill approved.

## 2023-07-16 ENCOUNTER — Ambulatory Visit (HOSPITAL_COMMUNITY)
Admission: RE | Admit: 2023-07-16 | Discharge: 2023-07-16 | Disposition: A | Discharge status: Home / Self Care | Payer: Medicare Other | Source: Ambulatory Visit | Attending: Cardiology | Admitting: Cardiology

## 2023-07-16 DIAGNOSIS — I482 Chronic atrial fibrillation, unspecified: Secondary | ICD-10-CM | POA: Diagnosis present

## 2023-07-16 DIAGNOSIS — I350 Nonrheumatic aortic (valve) stenosis: Secondary | ICD-10-CM

## 2023-07-16 DIAGNOSIS — I10 Essential (primary) hypertension: Secondary | ICD-10-CM

## 2023-07-16 LAB — ECHOCARDIOGRAM COMPLETE
AR max vel: 0.67 cm2
AV Area VTI: 0.74 cm2
AV Area mean vel: 0.76 cm2
AV Mean grad: 30 mmHg
AV Peak grad: 57.7 mmHg
Ao pk vel: 3.8 m/s
Area-P 1/2: 4.68 cm2
S' Lateral: 3.2 cm

## 2023-07-17 ENCOUNTER — Ambulatory Visit: Payer: Self-pay | Admitting: Cardiovascular Disease

## 2023-08-23 ENCOUNTER — Other Ambulatory Visit: Payer: Self-pay | Admitting: Cardiovascular Disease

## 2023-09-23 ENCOUNTER — Encounter: Payer: Self-pay | Admitting: Cardiovascular Disease

## 2023-09-23 ENCOUNTER — Ambulatory Visit: Payer: PRIVATE HEALTH INSURANCE | Attending: Cardiovascular Disease | Admitting: Cardiovascular Disease

## 2023-09-23 VITALS — BP 118/62 | HR 61 | Ht 69.0 in | Wt 191.7 lb

## 2023-09-23 DIAGNOSIS — E785 Hyperlipidemia, unspecified: Secondary | ICD-10-CM | POA: Insufficient documentation

## 2023-09-23 DIAGNOSIS — I482 Chronic atrial fibrillation, unspecified: Secondary | ICD-10-CM | POA: Insufficient documentation

## 2023-09-23 DIAGNOSIS — I350 Nonrheumatic aortic (valve) stenosis: Secondary | ICD-10-CM | POA: Insufficient documentation

## 2023-09-23 DIAGNOSIS — I10 Essential (primary) hypertension: Secondary | ICD-10-CM | POA: Insufficient documentation

## 2023-09-23 MED ORDER — LOSARTAN POTASSIUM 50 MG PO TABS: 50.0000 mg | ORAL_TABLET | Freq: Every day | ORAL | 3 refills | Status: AC

## 2023-09-23 MED ORDER — APIXABAN 5 MG PO TABS: 5.0000 mg | ORAL_TABLET | Freq: Two times a day (BID) | ORAL | 1 refills | Status: DC

## 2023-09-23 MED ORDER — DILTIAZEM HCL ER COATED BEADS 240 MG PO CP24: 240.0000 mg | ORAL_CAPSULE | Freq: Every day | ORAL | 3 refills | Status: AC

## 2023-09-23 MED ORDER — FUROSEMIDE 40 MG PO TABS: 40.0000 mg | ORAL_TABLET | Freq: Every day | ORAL | 3 refills | Status: AC

## 2023-09-23 MED ORDER — METOPROLOL TARTRATE 25 MG PO TABS: 25.0000 mg | ORAL_TABLET | Freq: Every day | ORAL | 3 refills | Status: DC

## 2023-09-23 MED ORDER — PRAVASTATIN SODIUM 80 MG PO TABS: 80.0000 mg | ORAL_TABLET | Freq: Every day | ORAL | 4 refills | Status: AC

## 2023-09-23 NOTE — Assessment & Plan Note (Signed)
 History of aortic stenosis that has demonstrated some progression.  His most recent 2D echo performed 07/16/2023 revealed normal LV systolic function, moderate concentric LVH with severe aortic stenosis.  Valve area is 0.74 cm with a peak gradient of 57 mmHg.  Right heart systolic pressure was 44 mmHg.  He has complained of increasing dyspnea on exertion and some dizziness.  I suspect that he is at the point where he will need this addressed.  He may be a candidate for TAVR.  I am referring him to our structural heart team to perform right left heart cath and further evaluation as necessary.

## 2023-09-23 NOTE — Assessment & Plan Note (Signed)
 History of essential hypertension blood pressure measured today at 118/62.  He is on diltiazem , losartan  and metoprolol .

## 2023-09-23 NOTE — Assessment & Plan Note (Addendum)
 History of dyslipidemia on statin therapy followed by his PCP.  His most recent lipid profile performed by his PCP 04/12/2022 revealed total cholesterol of 147, LDL 76 and HDL 38.

## 2023-09-23 NOTE — Progress Notes (Signed)
 09/23/2023 William Preston   Jun 20, 1950  [id number]  Primary Physician Marchelle Clem Pitts, MD Primary Cardiologist: Dorn [name], [place]  HPI:  William Preston is a 73 y.o.  moderately-overweight married Caucasian male, father of 3, whom I last saw in the office 09/21/2022. He has a history of chronic AFib, rate controlled, on Eliquis  anticoagulation.  He is accompanied by his wife William Preston today.  He has had 2 failed attempts at cardioversion in the past. He has normal coronary arteries and normal LV function by catheterization performed by me May 25, 2008. His other problems include obstructive sleep apnea and on CPAP, hypertension, hyperlipidemia, and type 2 diabetes. He denies chest pain or shortness of breath. Since I seen him a year ago he's remained currently stable. He had a bladder stone surgically removed which went without complication. He did stop his Coumadin  5 days prior. Since I saw him last he's remained currently stable.  He and his wife had a 3-week trip to the [place] in 2018, [place]  and [place]  and [place]    He is planning a trip to [place] this coming fall.[place]   He did have COVID-19 twice, once in March once on August 13 2019 he did have some headache and some myalgias.  He did transition to Eliquis  from Coumadin .     He did go to the [place] in [place]  and had an episode of syncope at a restaurant probably from dehydration.   Since I saw him a year ago he has noticed increasing dyspnea on exertion and some dizziness.  He denies chest pain.  His most recent 2D echo performed 07/16/2023 revealed normal LV systolic function, moderate concentric LVH with severe aortic stenosis.  Valve area was measured at 0.74 cm with a peak gradient of 57 mmHg and moderately elevated RV systolic pressure.  Current Meds  Medication Sig   apixaban  (ELIQUIS ) 5 MG TABS tablet Take 1 tablet (5 mg total)  by mouth 2 (two) times daily.   Ascorbic Acid (VITAMIN C) 1000 MG tablet Take 1,000 mg by mouth daily.   cholecalciferol (VITAMIN D) 1000 units tablet Take 1,000 Units by mouth daily.   digoxin  (LANOXIN ) 0.25 MG tablet Take 1/2 (one-half) tablet by mouth once daily   diltiazem  (CARDIZEM  CD) 240 MG 24 hr capsule Take 1 capsule (240 mg total) by mouth daily.   doxazosin (CARDURA) 4 MG tablet Take 4 mg by mouth daily.   finasteride (PROSCAR) 5 MG tablet Take 5 mg by mouth daily.   furosemide  (LASIX ) 40 MG tablet Take 1 tablet (40 mg total) by mouth daily.   glyBURIDE (DIABETA) 2.5 MG tablet Take 2.5 mg by mouth 3 (three) times daily.   ketoconazole (NIZORAL) 2 % cream Apply 1 Application topically daily.   losartan  (COZAAR ) 50 MG tablet Take 1 tablet (50 mg total) by mouth daily.   metFORMIN (GLUCOPHAGE) 500 MG tablet Take 500 mg by mouth 3 (three) times daily.   metoprolol  tartrate (LOPRESSOR ) 25 MG tablet Take 1 tablet by mouth once daily   Multiple Vitamins-Minerals (MULTIVITAMIN WITH MINERALS) tablet Take 1 tablet by mouth daily.   Multiple Vitamins-Minerals (PRESERVISION AREDS 2 PO) Take 1 capsule by mouth in the morning and at bedtime.   pravastatin  (PRAVACHOL ) 80 MG tablet Take 1 tablet (80 mg total) by mouth daily.   vitamin B-12 (CYANOCOBALAMIN) 1000 MCG tablet Take  1,000 mcg by mouth daily.   vitamin E 400 UNIT capsule Take 400 Units by mouth daily.     No Known Allergies  Social History   Socioeconomic History   Marital status: Married    Spouse name: Not on file   Number of children: Not on file   Years of education: 14   Highest education level: Some college, no degree  Occupational History   Occupation: Retired    Comment: [employer] in [employer]  Tobacco Use   Smoking status: Never   Smokeless tobacco: Never  Vaping Use   Vaping status: Never Used  Substance and Sexual Activity   Alcohol use: No   Drug use: No   Sexual activity: Yes  Other Topics Concern   Not on  file  Social History Narrative   Not on file   Social Drivers of Health   Financial Resource Strain: Not on file  Food Insecurity: Not on file  Transportation Needs: Not on file  Physical Activity: Not on file  Stress: Not on file  Social Connections: Not on file  Intimate Partner Violence: Not on file     Review of Systems: General: negative for chills, fever, night sweats or weight changes.  Cardiovascular: negative for chest pain, dyspnea on exertion, edema, orthopnea, palpitations, paroxysmal nocturnal dyspnea or shortness of breath Dermatological: negative for rash Respiratory: negative for cough or wheezing Urologic: negative for hematuria Abdominal: negative for nausea, vomiting, diarrhea, bright red blood per rectum, melena, or hematemesis Neurologic: negative for visual changes, syncope, or dizziness All other systems reviewed and are otherwise negative except as noted above.    Blood pressure 118/62, pulse 61, height 5' 9 (1.753 m), weight 191 lb 11.2 oz (87 kg), SpO2 98%.  General appearance: alert and no distress Neck: no adenopathy, no carotid bruit, no JVD, supple, symmetrical, trachea midline, and thyroid not enlarged, symmetric, no tenderness/mass/nodules Lungs: clear to auscultation bilaterally Heart: irregularly irregular rhythm and 2/6 outflow tract murmur consistent with aortic stenosis. Extremities: extremities normal, atraumatic, no cyanosis or edema Pulses: 2+ and symmetric Skin: Skin color, texture, turgor normal. No rashes or lesions Neurologic: Grossly normal  EKG EKG Interpretation Date/Time:  Monday September 23 2023 10:32:22 EDT Ventricular Rate:  61 PR Interval:    QRS Duration:  106 QT Interval:  386 QTC Calculation: 388 R Axis:   -24  Text Interpretation: Atrial fibrillation Minimal voltage criteria for LVH, may be normal variant ( Cornell product ) Septal infarct (cited on or before 24-Jan-2009) When compared with ECG of 21-Sep-2022  10:21, No significant change was found Confirmed by Court Carrier [phone]) on 09/23/2023 11:04:16 AM    ASSESSMENT AND PLAN:   Essential hypertension, benign History of essential hypertension blood pressure measured today at 118/62.  He is on diltiazem , losartan  and metoprolol .  Chronic atrial fibrillation (HCC) History of persistent A-fib rate controlled on Eliquis  oral anticoagulation.  Dyslipidemia History of dyslipidemia on statin therapy followed by his PCP.  His most recent lipid profile performed by his PCP 04/12/2022 revealed total cholesterol of 147, LDL 76 and HDL 38.  Mild aortic stenosis History of aortic stenosis that has demonstrated some progression.  His most recent 2D echo performed 07/16/2023 revealed normal LV systolic function, moderate concentric LVH with severe aortic stenosis.  Valve area is 0.74 cm with a peak gradient of 57 mmHg.  Right heart systolic pressure was 44 mmHg.  He has complained of increasing dyspnea on exertion and some dizziness.  I suspect that he  is at the point where he will need this addressed.  He may be a candidate for TAVR.  I am referring him to our structural heart team to perform right left heart cath and further evaluation as necessary.     Dorn DOROTHA Lesches MD FACP,FACC,FAHA, [facility] 09/23/2023 11:17 AM

## 2023-09-23 NOTE — Assessment & Plan Note (Signed)
History of persistent A-fib rate controlled on Eliquis oral anticoagulation. 

## 2023-09-23 NOTE — H&P (View-Only) (Signed)
 09/23/2023 William Preston   1950/03/15  [id number]  Primary Physician Marchelle Clem Pitts, MD Primary Cardiologist: William JINNY Lesches MD William Preston, [place]  HPI:  William Preston is a 73 y.o.  moderately-overweight married Caucasian male, father of 3, whom I last saw in the office 09/21/2022. He has a history of chronic AFib, rate controlled, on Eliquis  anticoagulation.  He is accompanied by his wife William Preston today.  He has had 2 failed attempts at cardioversion in the past. He has normal coronary arteries and normal LV function by catheterization performed by me May 25, 2008. His other problems include obstructive sleep apnea and on CPAP, hypertension, hyperlipidemia, and type 2 diabetes. He denies chest pain or shortness of breath. Since I seen him a year ago he's remained currently stable. He had a bladder stone surgically removed which went without complication. He did stop his Coumadin  5 days prior. Since I saw him last he's remained currently stable.  He and his wife had a 3-week trip to the [place] in 2018, [place]  and [place]  and [place]    He is planning a trip to [place] this coming fall.SABRA   He did have COVID-19 twice, once in March once on August 13 2019 he did have some headache and some myalgias.  He did transition to Eliquis  from Coumadin .     [place] did go [place]  and had an episode of syncope at a restaurant probably from dehydration.   Since I saw him a year ago he has noticed increasing dyspnea on exertion and some dizziness.  He denies chest pain.  His most recent 2D echo performed 07/16/2023 revealed normal LV systolic function, moderate concentric LVH with severe aortic stenosis.  Valve area was measured at 0.74 cm with a peak gradient of 57 mmHg and moderately elevated RV systolic pressure.  Current Meds  Medication Sig   apixaban  (ELIQUIS ) 5 MG TABS tablet Take 1 tablet (5 mg total)  by mouth 2 (two) times daily.   Ascorbic Acid (VITAMIN C) 1000 MG tablet Take 1,000 mg by mouth daily.   cholecalciferol (VITAMIN D) 1000 units tablet Take 1,000 Units by mouth daily.   digoxin  (LANOXIN ) 0.25 MG tablet Take 1/2 (one-half) tablet by mouth once daily   diltiazem  (CARDIZEM  CD) 240 MG 24 hr capsule Take 1 capsule (240 mg total) by mouth daily.   doxazosin (CARDURA) 4 MG tablet Take 4 mg by mouth daily.   finasteride (PROSCAR) 5 MG tablet Take 5 mg by mouth daily.   furosemide  (LASIX ) 40 MG tablet Take 1 tablet (40 mg total) by mouth daily.   glyBURIDE (DIABETA) 2.5 MG tablet Take 2.5 mg by mouth 3 (three) times daily.   ketoconazole (NIZORAL) 2 % cream Apply 1 Application topically daily.   losartan  (COZAAR ) 50 MG tablet Take 1 tablet (50 mg total) by mouth daily.   metFORMIN (GLUCOPHAGE) 500 MG tablet Take 500 mg by mouth 3 (three) times daily.   metoprolol  tartrate (LOPRESSOR ) 25 MG tablet Take 1 tablet by mouth once daily   Multiple Vitamins-Minerals (MULTIVITAMIN WITH MINERALS) tablet Take 1 tablet by mouth daily.   Multiple Vitamins-Minerals (PRESERVISION AREDS 2 PO) Take 1 capsule by mouth in the morning and at bedtime.   pravastatin  (PRAVACHOL ) 80 MG tablet Take 1 tablet (80 mg total) by mouth daily.   vitamin B-12 (CYANOCOBALAMIN) 1000 MCG tablet Take  1,000 mcg by mouth daily.   vitamin E 400 UNIT capsule Take 400 Units by mouth daily.     No Known Allergies  Social History   Socioeconomic History   Marital status: Married    Spouse name: Not on file   Number of children: Not on file   Years of education: 14   Highest education level: Some college, no degree  Occupational History   Occupation: Retired    Comment: [employer] in [employer]  Tobacco Use   Smoking status: Never   Smokeless tobacco: Never  Vaping Use   Vaping status: Never Used  Substance and Sexual Activity   Alcohol use: No   Drug use: No   Sexual activity: Yes  Other Topics Concern   Not on  file  Social History Narrative   Not on file   Social Drivers of Health   Financial Resource Strain: Not on file  Food Insecurity: Not on file  Transportation Needs: Not on file  Physical Activity: Not on file  Stress: Not on file  Social Connections: Not on file  Intimate Partner Violence: Not on file     Review of Systems: General: negative for chills, fever, night sweats or weight changes.  Cardiovascular: negative for chest pain, dyspnea on exertion, edema, orthopnea, palpitations, paroxysmal nocturnal dyspnea or shortness of breath Dermatological: negative for rash Respiratory: negative for cough or wheezing Urologic: negative for hematuria Abdominal: negative for nausea, vomiting, diarrhea, bright red blood per rectum, melena, or hematemesis Neurologic: negative for visual changes, syncope, or dizziness All other systems reviewed and are otherwise negative except as noted above.    Blood pressure 118/62, pulse 61, height 5' 9 (1.753 m), weight 191 lb 11.2 oz (87 kg), SpO2 98%.  General appearance: alert and no distress Neck: no adenopathy, no carotid bruit, no JVD, supple, symmetrical, trachea midline, and thyroid not enlarged, symmetric, no tenderness/mass/nodules Lungs: clear to auscultation bilaterally Heart: irregularly irregular rhythm and 2/6 outflow tract murmur consistent with aortic stenosis. Extremities: extremities normal, atraumatic, no cyanosis or edema Pulses: 2+ and symmetric Skin: Skin color, texture, turgor normal. No rashes or lesions Neurologic: Grossly normal  EKG EKG Interpretation Date/Time:  Monday September 23 2023 10:32:22 EDT Ventricular Rate:  61 PR Interval:    QRS Duration:  106 QT Interval:  386 QTC Calculation: 388 R Axis:   -24  Text Interpretation: Atrial fibrillation Minimal voltage criteria for LVH, may be normal variant ( Cornell product ) Septal infarct (cited on or before 24-Jan-2009) When compared with ECG of 21-Sep-2022  10:21, No significant change was found Confirmed by William Preston [phone]) on 09/23/2023 11:04:16 AM    ASSESSMENT AND PLAN:   Essential hypertension, benign History of essential hypertension blood pressure measured today at 118/62.  He is on diltiazem , losartan  and metoprolol .  Chronic atrial fibrillation (HCC) History of persistent A-fib rate controlled on Eliquis  oral anticoagulation.  Dyslipidemia History of dyslipidemia on statin therapy followed by his PCP.  His most recent lipid profile performed by his PCP 04/12/2022 revealed total cholesterol of 147, LDL 76 and HDL 38.  Mild aortic stenosis History of aortic stenosis that has demonstrated some progression.  His most recent 2D echo performed 07/16/2023 revealed normal LV systolic function, moderate concentric LVH with severe aortic stenosis.  Valve area is 0.74 cm with a peak gradient of 57 mmHg.  Right heart systolic pressure was 44 mmHg.  He has complained of increasing dyspnea on exertion and some dizziness.  I suspect that he  is at the point where he will need this addressed.  He may be a candidate for TAVR.  I am referring him to our structural heart team to perform right left heart cath and further evaluation as necessary.     William DOROTHA Lesches MD FACP,FACC,FAHA, [facility] 09/23/2023 11:17 AM

## 2023-09-23 NOTE — Patient Instructions (Signed)
 Medication Instructions:  Your physician recommends that you continue on your current medications as directed. Please refer to the Current Medication list given to you today.  *If you need a refill on your cardiac medications before your next appointment, please call your pharmacy*  Lab Work: Your physician recommends that you have labs drawn today: BMET & CBC  If you have labs (blood work) drawn today and your tests are completely normal, you will receive your results only by: MyChart Message (if you have MyChart) OR A paper copy in the mail If you have any lab test that is abnormal or we need to change your treatment, we will call you to review the results.  Testing/Procedures: See below  Follow-Up: At [facility], you and your health needs are our priority.  As part of our continuing mission to provide you with exceptional heart care, our providers are all part of one team.  This team includes your primary Cardiologist (physician) and Advanced Practice Providers or APPs (Physician Assistants and Nurse Practitioners) who all work together to provide you with the care you need, when you need it.  Your next appointment:   6 month(s)  Provider:   Dorn Lesches, MD    We recommend signing up for the patient portal called MyChart.  Sign up information is provided on this After Visit Summary.  MyChart is used to connect with patients for Virtual Visits (Telemedicine).  Patients are able to view lab/test results, encounter notes, upcoming appointments, etc.  Non-urgent messages can be sent to your provider as well.   To learn more about what you can do with MyChart, go to [email or web address].   Other Instructions       Cardiac/Peripheral Catheterization   You are scheduled for a Cardiac Catheterization on Monday, September 15 with Dr. Ozell Fell.  1. Please arrive at the [facility] (Main Entrance A) at [facility]: [address] [place], [place]  [address] at 8:30 AM (This time is 2 hour(s) before your procedure to ensure your preparation).   Free valet parking service is available. You will check in at ADMITTING. The support person will be asked to wait in the waiting room.  It is OK to have someone drop you off and come back when you are ready to be discharged.        Special note: Every effort is made to have your procedure done on time. Please understand that emergencies sometimes delay scheduled procedures.  2. Diet: Nothing to eat after midnight.  3. Hydration:You need to be well hydrated before your procedure. On September 15, you may drink approved liquids (see below) until 2 hours before the procedure, with 16 oz of water  as your last intake.   List of approved liquids water , clear juice, clear tea, black coffee, fruit juices, non-citric and without pulp, carbonated beverages, Gatorade, Kool -Aid, plain Jello-O and plain ice popsicles.  4. Labs: You will need to have blood drawn today (9/8).   5. Medication instructions in preparation for your procedure:    Stop taking Eliquis  (Apixiban) on Saturday, September 13.  Stop taking, Lasix  (Furosemide )  Monday, September 15,   Do not take Diabetes Med Glucophage (Metformin) and Glyburide on the day of the procedure and HOLD 48 HOURS AFTER THE PROCEDURE.  On the morning of your procedure, take Aspirin 81 mg and any morning medicines NOT listed above.  You may use sips of water .  6. Plan to go home the same day, you will  only stay overnight if medically necessary. 7. You MUST have a responsible adult to drive you home. 8. An adult MUST be with you the first 24 hours after you arrive home. 9. Bring a current list of your medications, and the last time and date medication taken. 10. Bring ID and current insurance cards. 11.Please wear clothes that are easy to get on and off and wear slip-on shoes.  Thank you for allowing us  to care for you!   -- [facility]

## 2023-09-24 ENCOUNTER — Ambulatory Visit: Payer: Self-pay | Admitting: Cardiovascular Disease

## 2023-09-24 ENCOUNTER — Other Ambulatory Visit: Payer: Self-pay | Admitting: Cardiovascular Disease

## 2023-09-24 DIAGNOSIS — I350 Nonrheumatic aortic (valve) stenosis: Secondary | ICD-10-CM

## 2023-09-24 LAB — BASIC METABOLIC PANEL WITH GFR
BUN/Creatinine Ratio: 20 (ref 10–24)
BUN: 19 mg/dL (ref 8–27)
CO2: 23 mmol/L (ref 20–29)
Calcium: 10 mg/dL (ref 8.6–10.2)
Chloride: 101 mmol/L (ref 96–106)
Creatinine, Ser: 0.94 mg/dL (ref 0.76–1.27)
Glucose: 79 mg/dL (ref 70–99)
Potassium: 4.3 mmol/L (ref 3.5–5.2)
Sodium: 140 mmol/L (ref 134–144)
eGFR: 85 mL/min/1.73 (ref >59)

## 2023-09-24 LAB — CBC WITH DIFFERENTIAL/PLATELET
Basophils Absolute: 0.1 x10E3/uL (ref 0.0–0.2)
Basos: 1 %
EOS (ABSOLUTE): 0.1 x10E3/uL (ref 0.0–0.4)
Eos: 2 %
Hematocrit: 43.3 % (ref 37.5–51.0)
Hemoglobin: 14 g/dL (ref 13.0–17.7)
Immature Grans (Abs): 0 x10E3/uL (ref 0.0–0.1)
Immature Granulocytes: 0 %
Lymphocytes Absolute: 1.3 x10E3/uL (ref 0.7–3.1)
Lymphs: 17 %
MCH: 30 pg (ref 26.6–33.0)
MCHC: 32.3 g/dL (ref 31.5–35.7)
MCV: 93 fL (ref 79–97)
Monocytes Absolute: 0.9 x10E3/uL (ref 0.1–0.9)
Monocytes: 11 %
Neutrophils Absolute: 5.3 x10E3/uL (ref 1.4–7.0)
Neutrophils: 69 %
Platelets: 176 x10E3/uL (ref 150–450)
RBC: 4.66 x10E6/uL (ref 4.14–5.80)
RDW: 13 % (ref 11.6–15.4)
WBC: 7.6 x10E3/uL (ref 3.4–10.8)

## 2023-09-26 ENCOUNTER — Other Ambulatory Visit: Payer: Self-pay | Admitting: Cardiovascular Disease

## 2023-09-26 ENCOUNTER — Encounter: Payer: Self-pay | Admitting: Cardiovascular Disease

## 2023-09-30 ENCOUNTER — Encounter (HOSPITAL_COMMUNITY)
Admission: RE | Disposition: A | Discharge status: Home / Self Care | Payer: Self-pay | Source: Home / Self Care | Attending: Cardiovascular Disease

## 2023-09-30 ENCOUNTER — Ambulatory Visit (HOSPITAL_COMMUNITY)
Admission: RE | Admit: 2023-09-30 | Discharge: 2023-09-30 | Disposition: A | Discharge status: Home / Self Care | Payer: PRIVATE HEALTH INSURANCE | Attending: Cardiovascular Disease | Admitting: Cardiovascular Disease

## 2023-09-30 ENCOUNTER — Other Ambulatory Visit: Payer: Self-pay

## 2023-09-30 ENCOUNTER — Encounter (HOSPITAL_COMMUNITY): Payer: Self-pay | Admitting: Cardiovascular Disease

## 2023-09-30 DIAGNOSIS — E119 Type 2 diabetes mellitus without complications: Secondary | ICD-10-CM | POA: Diagnosis not present

## 2023-09-30 DIAGNOSIS — Z7984 Long term (current) use of oral hypoglycemic drugs: Secondary | ICD-10-CM | POA: Insufficient documentation

## 2023-09-30 DIAGNOSIS — Z7901 Long term (current) use of anticoagulants: Secondary | ICD-10-CM | POA: Diagnosis not present

## 2023-09-30 DIAGNOSIS — I350 Nonrheumatic aortic (valve) stenosis: Secondary | ICD-10-CM

## 2023-09-30 DIAGNOSIS — E785 Hyperlipidemia, unspecified: Secondary | ICD-10-CM | POA: Insufficient documentation

## 2023-09-30 DIAGNOSIS — I482 Chronic atrial fibrillation, unspecified: Secondary | ICD-10-CM | POA: Insufficient documentation

## 2023-09-30 DIAGNOSIS — I10 Essential (primary) hypertension: Secondary | ICD-10-CM | POA: Insufficient documentation

## 2023-09-30 DIAGNOSIS — G4733 Obstructive sleep apnea (adult) (pediatric): Secondary | ICD-10-CM | POA: Diagnosis not present

## 2023-09-30 DIAGNOSIS — Z79899 Other long term (current) drug therapy: Secondary | ICD-10-CM | POA: Insufficient documentation

## 2023-09-30 DIAGNOSIS — I2510 Atherosclerotic heart disease of native coronary artery without angina pectoris: Secondary | ICD-10-CM

## 2023-09-30 LAB — GLUCOSE, CAPILLARY
Glucose-Capillary: 137 mg/dL — ABNORMAL HIGH (ref 70–99)
Glucose-Capillary: 102 mg/dL — ABNORMAL HIGH (ref 70–99)

## 2023-09-30 LAB — POCT I-STAT 7, (LYTES, BLD GAS, ICA,H+H)
Acid-base deficit: 1 mmol/L (ref 0.0–2.0)
Bicarbonate: 23.8 mmol/L (ref 20.0–28.0)
Calcium, Ion: 1.26 mmol/L (ref 1.15–1.40)
HCT: 39 % (ref 39.0–52.0)
Hemoglobin: 13.3 g/dL (ref 13.0–17.0)
O2 Saturation: 97 %
Potassium: 3.8 mmol/L (ref 3.5–5.1)
Sodium: 138 mmol/L (ref 135–145)
TCO2: 25 mmol/L (ref 22–32)
pCO2 arterial: 38.5 mmHg (ref 32–48)
pH, Arterial: 7.399 (ref 7.35–7.45)
pO2, Arterial: 91 mmHg (ref 83–108)

## 2023-09-30 LAB — POCT I-STAT EG7
Acid-base deficit: 3 mmol/L — ABNORMAL HIGH (ref 0.0–2.0)
Bicarbonate: 23.6 mmol/L (ref 20.0–28.0)
Calcium, Ion: 1.09 mmol/L — ABNORMAL LOW (ref 1.15–1.40)
HCT: 35 % — ABNORMAL LOW (ref 39.0–52.0)
Hemoglobin: 11.9 g/dL — ABNORMAL LOW (ref 13.0–17.0)
O2 Saturation: 62 %
Potassium: 3.4 mmol/L — ABNORMAL LOW (ref 3.5–5.1)
Sodium: 137 mmol/L (ref 135–145)
TCO2: 25 mmol/L (ref 22–32)
pCO2, Ven: 45.3 mmHg (ref 44–60)
pH, Ven: 7.325 (ref 7.25–7.43)
pO2, Ven: 35 mmHg (ref 32–45)

## 2023-09-30 SURGERY — RIGHT/LEFT HEART CATH AND CORONARY ANGIOGRAPHY
Anesthesia: LOCAL

## 2023-09-30 MED ORDER — FREE WATER: 500.0000 mL | Freq: Once | Status: DC

## 2023-09-30 MED ORDER — LABETALOL HCL 5 MG/ML IV SOLN: 10.0000 mg | INTRAVENOUS | Status: DC | PRN

## 2023-09-30 MED ORDER — ONDANSETRON HCL 4 MG/2ML IJ SOLN: 4.0000 mg | Freq: Four times a day (QID) | INTRAMUSCULAR | Status: DC | PRN

## 2023-09-30 MED ORDER — HEPARIN SODIUM (PORCINE) 1000 UNIT/ML IJ SOLN
INTRAMUSCULAR | Status: AC
  Filled 2023-09-30: qty 10

## 2023-09-30 MED ORDER — FENTANYL CITRATE (PF) 100 MCG/2ML IJ SOLN
INTRAMUSCULAR | Status: DC | PRN
  Administered 2023-09-30: 25 ug via INTRAVENOUS

## 2023-09-30 MED ORDER — VERAPAMIL HCL 2.5 MG/ML IV SOLN
INTRAVENOUS | Status: DC | PRN
  Administered 2023-09-30: 10 mL via INTRA_ARTERIAL

## 2023-09-30 MED ORDER — SODIUM CHLORIDE 0.9% FLUSH: 3.0000 mL | Freq: Two times a day (BID) | INTRAVENOUS | Status: DC

## 2023-09-30 MED ORDER — SODIUM CHLORIDE 0.9% FLUSH: 3.0000 mL | INTRAVENOUS | Status: DC | PRN

## 2023-09-30 MED ORDER — FENTANYL CITRATE (PF) 100 MCG/2ML IJ SOLN
INTRAMUSCULAR | Status: AC
  Filled 2023-09-30: qty 2

## 2023-09-30 MED ORDER — VERAPAMIL HCL 2.5 MG/ML IV SOLN
INTRAVENOUS | Status: AC
  Filled 2023-09-30: qty 2

## 2023-09-30 MED ORDER — LIDOCAINE HCL (PF) 1 % IJ SOLN
INTRAMUSCULAR | Status: DC | PRN
  Administered 2023-09-30 (×2): 2 mL via INTRADERMAL

## 2023-09-30 MED ORDER — SODIUM CHLORIDE 0.9 % IV SOLN: 250.0000 mL | INTRAVENOUS | Status: DC | PRN

## 2023-09-30 MED ORDER — MIDAZOLAM HCL 2 MG/2ML IJ SOLN
INTRAMUSCULAR | Status: AC
  Filled 2023-09-30: qty 2

## 2023-09-30 MED ORDER — HEPARIN (PORCINE) IN NACL 1000-0.9 UT/500ML-% IV SOLN
INTRAVENOUS | Status: DC | PRN
  Administered 2023-09-30: 1000 mL

## 2023-09-30 MED ORDER — MIDAZOLAM HCL 2 MG/2ML IJ SOLN
INTRAMUSCULAR | Status: DC | PRN
  Administered 2023-09-30: 2 mg via INTRAVENOUS

## 2023-09-30 MED ORDER — LIDOCAINE HCL (PF) 1 % IJ SOLN
INTRAMUSCULAR | Status: AC
  Filled 2023-09-30: qty 30

## 2023-09-30 MED ORDER — IOHEXOL 350 MG/ML SOLN
INTRAVENOUS | Status: DC | PRN
  Administered 2023-09-30: 55 mL

## 2023-09-30 MED ORDER — HEPARIN SODIUM (PORCINE) 1000 UNIT/ML IJ SOLN
INTRAMUSCULAR | Status: DC | PRN
  Administered 2023-09-30: 4000 [IU] via INTRAVENOUS

## 2023-09-30 MED ORDER — ACETAMINOPHEN 325 MG PO TABS: 650.0000 mg | ORAL_TABLET | ORAL | Status: DC | PRN

## 2023-09-30 MED ORDER — HYDRALAZINE HCL 20 MG/ML IJ SOLN: 10.0000 mg | INTRAMUSCULAR | Status: DC | PRN

## 2023-09-30 SURGICAL SUPPLY — 11 items
CATH 5FR JL3.5 JR4 ANG PIG MP (CATHETERS) IMPLANT
CATH BALLN WEDGE 5F 110CM (CATHETERS) IMPLANT
CATH INFINITI 5FR AL1 (CATHETERS) IMPLANT
DEVICE RAD COMP TR BAND LRG (VASCULAR PRODUCTS) IMPLANT
GLIDESHEATH SLEND SS 6F .021 (SHEATH) IMPLANT
GUIDEWIRE INQWIRE 1.5J.035X260 (WIRE) IMPLANT
PACK CARDIAC CATHETERIZATION (CUSTOM PROCEDURE TRAY) ×2 IMPLANT
SET ATX-X65L (MISCELLANEOUS) IMPLANT
SHEATH GLIDE SLENDER 4/5FR (SHEATH) IMPLANT
WIRE EMERALD ST .035X260CM (WIRE) IMPLANT
WIRE HI TORQ VERSACORE-J 145CM (WIRE) IMPLANT

## 2023-09-30 NOTE — Discharge Instructions (Signed)

## 2023-09-30 NOTE — Progress Notes (Signed)
 Patient and wife given d/c instructions, reviewed with them, verbalizes understanding.

## 2023-09-30 NOTE — Interval H&P Note (Signed)
 History and Physical Interval Note:  09/30/2023 9:38 AM  William Preston  has presented today for surgery, with the diagnosis of aortic stenosis.  The various methods of treatment have been discussed with the patient and family. After consideration of risks, benefits and other options for treatment, the patient has consented to  Procedure(s): RIGHT/LEFT HEART CATH AND CORONARY ANGIOGRAPHY (N/A) as a surgical intervention.  The patient's history has been reviewed, patient examined, no change in status, stable for surgery.  I have reviewed the patient's chart and labs.  Questions were answered to the patient's satisfaction.     Ozell Fell

## 2023-10-07 ENCOUNTER — Encounter: Payer: Self-pay | Admitting: Cardiovascular Disease

## 2023-10-07 ENCOUNTER — Ambulatory Visit: Attending: Cardiovascular Disease | Admitting: Cardiovascular Disease

## 2023-10-07 VITALS — BP 110/60 | HR 82 | Ht 69.0 in | Wt 185.0 lb

## 2023-10-07 DIAGNOSIS — I350 Nonrheumatic aortic (valve) stenosis: Secondary | ICD-10-CM | POA: Diagnosis present

## 2023-10-07 NOTE — Progress Notes (Signed)
 [facility] Consult Note  Chief Complaint  Patient presents with   New Patient (Initial Visit)    Aortic stenosis   History of Present Illness: 73 yo male with history of persistent atrial fibrillation, HLD, HTN, DM, sleep apnea and aortic stenosis who is here today as a new consult, referred by Dr. Court, for further discussion regarding his aortic stenosis and possible TAVR. He has persistent atrial fibrillation and is on Eliquis . He has been followed by Dr. Court for moderate aortic stenosis. Echo 07/16/23 with LVEF=60-65%. Mild to moderate mitral regurgitation. Severe aortic stenosis with mean gradient 30 mmHg, peak gradient 57.7 mmHg, AVA 0.67 cm2, SVI 29, DI 0.21. Cardiac cath 09/30/23 with moderate non-obstructive CAD with plans for medical management of his CAD.   He tells me today that he has progressive dyspnea on exertion, fatigue and LE edema. He has some dizziness with standing. No chest pain. He lives in [place], [place] with his wife. He is retired from [employer]. He has no active dental issues.   Primary Care Physician: Marchelle Clem Pitts, MD Primary Cardiologist: Court Referring Cardiologist: Court  Past Medical History:  Diagnosis Date   Aortic stenosis    Cardiac murmur 11/06/2012   Chronic atrial fibrillation 04/20/2008   Dyslipidemia 11/06/2012   Essential hypertension, benign 04/20/2008   History of colonic polyps 12/12/2020   History of COVID-19    Long term (current) use of anticoagulants 04/01/2012   Obstructive sleep apnea 04/20/2008   nightly CPAP use   Type II diabetes mellitus 11/06/2012    Past Surgical History:  Procedure Laterality Date   CARDIAC CATHETERIZATION  11/2003   normal   CARDIOVERSION  05/25/2008   successful   CATARACT EXTRACTION Bilateral    COLONOSCOPY N/A 06/20/2017   Surgeon: Harvey Margo CROME, MD; 6 polyps removed (tubular adenomas), diverticulosis in the descending, sigmoid, and rectosigmoid colon, external and  internal hemorrhoids, redundant left colon.  Recommended repeat in 3 years.   COLONOSCOPY WITH PROPOFOL  N/A 01/30/2021   Procedure: COLONOSCOPY WITH PROPOFOL ;  Surgeon: Cindie Carlin POUR, DO;  [facility];  Service: Endoscopy;  Laterality: N/A;  7:30am   NM MYOCAR PERF WALL MOTION  10/29/2007   low risk   POLYPECTOMY  06/20/2017   Procedure: POLYPECTOMY;  Surgeon: Harvey Margo CROME, MD;  Location: [facility];  Service: Endoscopy;;  ascending colon x3, transverse colon polyp (cb)   POLYPECTOMY  01/30/2021   Procedure: POLYPECTOMY;  Surgeon: Cindie Carlin POUR, DO;  [facility]: [facility];  Service: Endoscopy;;   RIGHT/LEFT HEART CATH AND CORONARY ANGIOGRAPHY N/A 09/30/2023   Procedure: RIGHT/LEFT HEART CATH AND CORONARY ANGIOGRAPHY;  Surgeon: Wonda Sharper, MD;  Location: [facility] IN[facility]ASIVE CV LAB;  Service: Cardiovascular;  Laterality: N/A;   ruptured spleen     age 34, fell off a tractor that pushed him across a field   SPLENECTOMY, 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 180 tablet 1   Ascorbic Acid (VITAMIN C) 1000 MG tablet Take 1,000 mg by mouth daily.     cholecalciferol (VITAMIN D) 1000 units tablet Take 1,000 Units by mouth daily.     digoxin  (LANOXIN ) 0.25 MG tablet Take 1/2 (  one-half) tablet by mouth once daily 45 tablet 3   diltiazem  (CARDIZEM  CD) 240 MG 24 hr capsule Take 1 capsule (240 mg total) by mouth daily. 90 capsule 3   doxazosin (CARDURA) 4 MG tablet Take 4 mg by mouth daily.     finasteride (PROSCAR) 5 MG tablet Take 5 mg by mouth daily.  0   furosemide  (LASIX ) 40 MG tablet Take 1 tablet (40 mg total) by mouth daily. (Patient taking differently: Take 40 mg by mouth daily as needed for fluid or  edema.) 90 tablet 3   glyBURIDE (DIABETA) 2.5 MG tablet Take 2.5 mg by mouth 3 (three) times daily.     losartan  (COZAAR ) 50 MG tablet Take 1 tablet (50 mg total) by mouth daily. 90 tablet 3   MAGNESIUM GLYCINATE PO Take 500 mg by mouth daily.     metFORMIN (GLUCOPHAGE) 500 MG tablet Take 500 mg by mouth 3 (three) times daily.     metoprolol  tartrate (LOPRESSOR ) 25 MG tablet Take 1 tablet (25 mg total) by mouth daily. 90 tablet 3   Multiple Vitamins-Minerals (MULTIVITAMIN WITH MINERALS) tablet Take 1 tablet by mouth daily.     Multiple Vitamins-Minerals (PRESERVISION AREDS 2 PO) Take 1 capsule by mouth in the morning and at bedtime.     NON FORMULARY Pt uses a cpap nightly     pravastatin  (PRAVACHOL ) 80 MG tablet Take 1 tablet (80 mg total) by mouth daily. 90 tablet 4   vitamin B-12 (CYANOCOBALAMIN) 1000 MCG tablet Take 1,000 mcg by mouth daily.     vitamin E 400 UNIT capsule Take 400 Units by mouth daily.     diltiazem  (TIAZAC ) 240 MG 24 hr capsule Take 240 mg by mouth daily. (Patient not taking: Reported on 10/07/2023)     No current facility-administered medications for this visit.    No Known Allergies  Social History   Socioeconomic History   Marital status: Married    Spouse name: Not on file   Number of children: Not on file   Years of education: 14   Highest education level: Some college, no degree  Occupational History   Occupation: Retired    Comment: [employer] in [employer]  Tobacco Use   Smoking status: Never   Smokeless tobacco: Never  Vaping Use   Vaping status: Never Used  Substance and Sexual Activity   Alcohol use: No   Drug use: No   Sexual activity: Yes  Other Topics Concern   Not on file  Social History Narrative   Not on file   Social Drivers of Health   Financial Resource Strain: Not on file  Food Insecurity: Not on file  Transportation Needs: Not on file  Physical Activity: Not on file  Stress: Not on file  Social Connections: Not on file   Intimate Partner Violence: Not on file    Family History  Problem Relation Age of Onset   Heart failure Mother    Heart attack Mother    Heart failure Father    Heart disease Father    Hyperlipidemia Sister    Hyperlipidemia Brother    Colon cancer Neg Hx    Colon polyps Neg Hx     Review of Systems:  As stated in the HPI and otherwise negative.   BP 110/60 (BP Location: Left Arm, Patient Position: Sitting, Cuff Size: Normal)   Pulse 82   Ht 5' 9 (1.753 m)   Wt 185 lb (83.9 kg)   SpO2 96%  BMI 27.32 kg/m   Physical Examination: General: Well developed, well nourished, NAD  HEENT: OP clear, mucus membranes moist  SKIN: warm, dry. No rashes. Neuro: No focal deficits  Musculoskeletal: Muscle strength 5/5 all ext  Psychiatric: Mood and affect normal  Neck: No JVD, no carotid bruits, no thyromegaly, no lymphadenopathy.  Lungs:Clear bilaterally, no wheezes, rhonci, crackles Cardiovascular: Regular rate and rhythm. Loud, harsh, late peaking systolic murmur.  Abdomen:Soft. Bowel sounds present. Non-tender.  Extremities: No lower extremity edema. Pulses are 2 + in the bilateral DP/PT.  EKG:  EKG is not ordered today. The ekg ordered today demonstrates   Echo 07/16/23: 1. Left ventricular ejection fraction, by estimation, is 60 to 65%. The  left ventricle has normal function. The left ventricle has no regional  wall motion abnormalities. There is moderate left ventricular hypertrophy.  Left ventricular diastolic  parameters are indeterminate.   2. Right ventricular systolic function is normal. The right ventricular  size is normal. There is mildly elevated pulmonary artery systolic  pressure.   3. Left atrial size was severely dilated.   4. Right atrial size was moderately dilated.   5. The mitral valve is degenerative. Mild to moderate mitral valve  regurgitation. No evidence of mitral stenosis.   6. Tricuspid valve regurgitation is mild to moderate.   7. The aortic  valve is tricuspid. There is moderate calcification of the  aortic valve. There is severe thickening of the aortic valve. Aortic valve  regurgitation is trivial. Moderate to severe aortic valve stenosis. Aortic  valve area, by VTI measures  0.74 cm. Aortic valve mean gradient measures 30.0 mmHg. Aortic valve Vmax  measures 3.80 m/s.   8. The inferior vena cava is dilated in size with <50% respiratory  variability, suggesting right atrial pressure of 15 mmHg.   9. Cannot exclude a small PFO.   Comparison(s): Changes from prior study are noted. Severe AS by AVA and DI  (0.21), moderate by Vmax and gradient. Stroke volume is low, suspect that  there may be a component of low flow-low gradient AS and valve is severely  stenotic.   FINDINGS   Left Ventricle: Left ventricular ejection fraction, by estimation, is 60  to 65%. The left ventricle has normal function. The left ventricle has no  regional wall motion abnormalities. The left ventricular internal cavity  size was normal in size. There is   moderate left ventricular hypertrophy. Left ventricular diastolic  parameters are indeterminate.   Right Ventricle: The right ventricular size is normal. No increase in  right ventricular wall thickness. Right ventricular systolic function is  normal. There is mildly elevated pulmonary artery systolic pressure. The  tricuspid regurgitant velocity is 2.71   m/s, and with an assumed right atrial pressure of 15 mmHg, the estimated  right ventricular systolic pressure is 44.4 mmHg.   Left Atrium: Left atrial size was severely dilated.   Right Atrium: Right atrial size was moderately dilated.   Pericardium: There is no evidence of pericardial effusion.   Mitral Valve: The mitral valve is degenerative in appearance. Mild to  moderate mitral annular calcification. Mild to moderate mitral valve  regurgitation. No evidence of mitral valve stenosis.   Tricuspid Valve: The tricuspid valve is normal  in structure. Tricuspid  valve regurgitation is mild to moderate. No evidence of tricuspid  stenosis.   Aortic Valve: Severe AS by AVA and DI (0.21), moderate by Vmax and  gradient. The aortic valve is tricuspid. There is moderate calcification  of  the aortic valve. There is severe thickening of the aortic valve.  Aortic valve regurgitation is trivial.  Moderate to severe aortic stenosis is present. Aortic valve mean gradient  measures 30.0 mmHg. Aortic valve peak gradient measures 57.7 mmHg. Aortic  valve area, by VTI measures 0.74 cm.   Pulmonic Valve: The pulmonic valve was grossly normal. Pulmonic valve  regurgitation is trivial. No evidence of pulmonic stenosis.   Aorta: The aortic root, ascending aorta and aortic arch are all  structurally normal, with no evidence of dilitation or obstruction.   Venous: The inferior vena cava is dilated in size with less than 50%  respiratory variability, suggesting right atrial pressure of 15 mmHg.   IAS/Shunts: Cannot exclude a small PFO.     LEFT VENTRICLE  PLAX 2D  LVIDd:         4.60 cm   Diastology  LVIDs:         3.20 cm   LV e' medial:    9.93 cm/s  LV PW:         1.50 cm   LV E/e' medial:  12.9  LV IVS:        1.60 cm   LV e' lateral:   13.00 cm/s  LVOT diam:     2.10 cm   LV E/e' lateral: 9.8  LV SV:         59  LV SV Index:   29  LVOT Area:     3.46 cm     RIGHT VENTRICLE            IVC  RVSP:           37.4 mmHg  IVC diam: 2.60 cm   LEFT ATRIUM             Index        RIGHT ATRIUM           Index  LA diam:        6.20 cm 3.00 cm/m   RA Pressure: 8.00 mmHg  LA Vol (A2C):   95.0 ml 45.98 ml/m  RA Area:     27.30 cm  LA Vol (A4C):   94.4 ml 45.69 ml/m  RA Volume:   91.10 ml  44.09 ml/m  LA Biplane Vol: 97.5 ml 47.19 ml/m   AORTIC VALVE  AV Area (Vmax):    0.67 cm  AV Area (Vmean):   0.76 cm  AV Area (VTI):     0.74 cm  AV Vmax:           379.80 cm/s  AV Vmean:          249.000 cm/s  AV VTI:            0.801  m  AV Peak Grad:      57.7 mmHg  AV Mean Grad:      30.0 mmHg  LVOT Vmax:         73.76 cm/s  LVOT Vmean:        54.920 cm/s  LVOT VTI:          0.172 m  LVOT/AV VTI ratio: 0.21    AORTA  Ao Root diam: 3.50 cm  Ao Asc diam:  3.30 cm   MITRAL VALVE                TRICUSPID VALVE  MV Area (PHT): 4.68 cm     TR Peak grad:   29.4 mmHg  MV Decel Time: 162  msec     TR Vmax:        271.00 cm/s  MV E velocity: 127.80 cm/s  Estimated RAP:  8.00 mmHg                              RVSP:           37.4 mmHg                                SHUNTS                              Systemic VTI:  0.17 m                              Systemic Diam: 2.10 cm   Cardiac cath 09/30/23: 1.  Moderate coronary artery disease with 70% PDA stenosis, 50% mid and 60% distal LAD stenoses, and 50% diagonal stenosis.   2.  Moderate coronary ectasia involving primarily the RCA and left circumflex 3.  Severe aortic stenosis with mean transvalvular gradient 41 mmHg, calculated aortic valve area 0.56 cm 4.  Right heart catheterization data: RA 2 mmHg RV 30/4 mmHg PA 30/15 mean 17 mmHg Wedge pressure 9 mmHg, 17 mmHg V wave LVEDP 7 mmHg Cardiac output 4.0 L/min, cardiac index 2.0 by Fick calculation   Recommendations: Structural heart consultation for further evaluation and treatment of severe symptomatic aortic stenosis   Procedural Details  Technical Details INDICATION: Pt with symptomatic aortic stenosis referred for R/L heart catheterization for further evaluation.  PROCEDURAL DETAILS: There was an indwelling IV in a right antecubital vein. Using normal sterile technique, the IV was changed out for a 5 Fr brachial sheath over a 0.018 inch wire. The right wrist was then prepped, draped, and anesthetized with 1% lidocaine . Using the modified Seldinger technique a 5/6 French Slender sheath was placed in the right radial artery. Intra-arterial verapamil  was administered through the radial artery sheath. IV heparin  was  administered after a JR4 catheter was advanced into the central aorta. A Swan-Ganz catheter was used for the right heart catheterization. Standard protocol was followed for recording of right heart pressures and sampling of oxygen saturations. Fick cardiac output was calculated. Standard Judkins catheters were used for selective coronary angiography. LV pressure is recorded and an aortic valve pullback is performed. There were no immediate procedural complications. The patient was transferred to the post catheterization recovery area for further monitoring.      Estimated blood loss <50 mL.   During this procedure medications were administered to achieve and maintain moderate conscious sedation while the patient's heart rate, blood pressure, and oxygen saturation were continuously monitored and I was present face-to-face 100% of this time. Vernell Primrose RN and [facility] Cardiovascular Specialist are independent, trained observers who assisted in the monitoring of the patient's level of consciousness.   Medications (Filter: Administrations occurring from [phone] to 1022 on 09/30/23) Heparin  (Porcine) in NaCl 1000-0.9 UT/500ML-% SOLN (mL)  Total volume: 1,000 mL Date/Time Rate/Dose/Volume Action   09/30/23 0946 1,000 mL Given   fentaNYL  (SUBLIMAZE ) injection (mcg)  Total dose: 25 mcg Date/Time Rate/Dose/Volume Action   09/30/23 0942 25 mcg Given   midazolam  (VERSED ) injection (mg)  Total dose: 2 mg Date/Time Rate/Dose/Volume Action   09/30/23 9057  2 mg Given   lidocaine  (PF) (XYLOCAINE ) 1 % injection (mL)  Total volume: 4 mL Date/Time Rate/Dose/Volume Action   09/30/23 0949 2 mL Given   0950 2 mL Given   Radial Cocktail/Verapamil  only (mL)  Total volume: 10 mL Date/Time Rate/Dose/Volume Action   09/30/23 0952 10 mL Given   heparin  sodium (porcine) injection (Units)  Total dose: 4,000 Units Date/Time Rate/Dose/Volume Action   09/30/23 1001 4,000 Units Given   iohexol  (OMNIPAQUE ) 350  MG/ML injection (mL)  Total volume: 55 mL Date/Time Rate/Dose/Volume Action   09/30/23 1017 55 mL Given    Sedation Time  Sedation Time Physician-1: 29 minutes 59 seconds Contrast     Administrations occurring from 0923 to 1022 on 09/30/23:  Medication Name Total Dose  iohexol  (OMNIPAQUE ) 350 MG/ML injection 55 mL   Radiation/Fluoro  Fluoro time: 6 (min) DAP: 18.7 (Gycm2) Cumulative Air Kerma: 320.7 (mGy) Complications  Complications documented before study signed (09/30/2023 10:23 AM)   No complications were associated with this study.  Documented by Sharran Frieze - 09/30/2023 10:19 AM     Coronary Findings  Diagnostic Dominance: Right Left Anterior Descending  Prox LAD to Mid LAD lesion is 40% stenosed.  Dist LAD lesion is 50% stenosed.    First Diagonal Branch  1st Diag lesion is 50% stenosed.    Right Coronary Artery  Vessel is large. There is mild diffuse disease throughout the vessel. The vessel is ectatic. Large, dominant vessel with moderate ectasia in the proximal and mid vessel    Right Posterior Descending Artery  RPDA lesion is 60% stenosed.    Intervention   No interventions have been documented.   Left Heart  Aortic Valve There is severe aortic valve stenosis. The aortic valve is calcified. There is restricted aortic valve motion.   Coronary Diagrams  Diagnostic Dominance: Right  Intervention   Implants   No implant documentation for this case.   Syngo Images   Show images for CARDIAC CATHETERIZATION Images on Long Term Storage   Show images for Illias, Pantano to Procedure Log  Procedure Log    Hemo Data  Flowsheet Row Most Recent Value  Fick Cardiac Output 4.04 L/min  Fick Cardiac Output Index 2 (L/min)/BSA  Aortic Mean Gradient 41.4 mmHg  Aortic Peak Gradient 53.4 mmHg  Aortic Valve Area 0.56  Aortic Value Area Index 0.27 cm2/BSA  RA A Wave 4 mmHg  RA V Wave 4 mmHg  RA Mean 2 mmHg  RV Systolic Pressure 30 mmHg   RV Diastolic Pressure -3 mmHg  RV EDP 4 mmHg  PA Systolic Pressure 30 mmHg  PA Diastolic Pressure 15 mmHg  PA Mean 17 mmHg  PW A Wave 7 mmHg  PW V Wave 17 mmHg  PW Mean 9 mmHg  AO Systolic Pressure 72 mmHg  AO Diastolic Pressure 51 mmHg  AO Mean 61 mmHg  LV Systolic Pressure 132 mmHg  LV Diastolic Pressure -4 mmHg  LV EDP 9 mmHg  Arterial Occlusion Pressure Extended Systolic Pressure 80 mmHg  Arterial Occlusion Pressure Extended Diastolic Pressure 58 mmHg  Arterial Occlusion Pressure Extended Mean Pressure 63 mmHg  Left Ventricular Apex Extended Systolic Pressure 135 mmHg  LVp Diastolic Pressure -5 mmHg  Left Ventricular Apex Extended EDP Pressure 6 mmHg  QP/QS 1  TPVR Index 8.52 HRUI  TSVR Index 30.56 HRUI  PVR SVR Ratio 0.14  TPVR/TSVR Ratio 0.28    Recent Labs: 09/23/2023: BUN 19; Creatinine, Ser 0.94; Platelets 176 09/30/2023: Hemoglobin 11.9;  Hemoglobin 13.3; Potassium 3.4; Potassium 3.8; Sodium 137; Sodium 138   Lipid Panel    Component Value Date/Time   CHOL 147 09/08/2019 1011   TRIG 238 (H) 09/08/2019 1011   HDL 35 (L) 09/08/2019 1011   CHOLHDL 4.2 09/08/2019 1011   LDLCALC 73 09/08/2019 1011     Wt Readings from Last 3 Encounters:  10/07/23 185 lb (83.9 kg)  09/30/23 191 lb (86.6 kg)  09/23/23 191 lb 11.2 oz (87 kg)     Assessment and Plan:   1. Severe Aortic Valve Stenosis: He has severe, stage D aortic valve stenosis. He has NYHA class 3 symptoms. I have personally reviewed the echo images. The aortic valve is thickened and calcified with limited leaflet mobility. I think he would benefit from AVR. Given advanced age, he is not a good candidate for conventional AVR by surgical approach. I think he may be a good candidate for TAVR.   I have reviewed the natural history of aortic stenosis with the patient and their family members  who are present today. We have discussed the limitations of medical therapy and the poor prognosis associated with symptomatic  aortic stenosis. We have reviewed potential treatment options, including palliative medical therapy, conventional surgical aortic valve replacement, and transcatheter aortic valve replacement. We discussed treatment options in the context of the patient's specific comorbid medical conditions.   He would like to proceed with planning for TAVR. Risks and benefits of the valve procedure are reviewed with the patient. I will arrange a cardiac CT, CTA of the chest/abdomen and pelvis and he will then be referred to see one of the CT surgeons on our TAVR team.     Labs/ tests ordered today include:   Orders Placed This Encounter  Procedures   CT CORONARY MORPH W/CTA COR W/SCORE W/CA W/CM &/OR WO/CM   CT ANGIO CHEST AORTA W/CM & OR WO/CM   CT ANGIO ABDOMEN PELVIS  W & WO CONTRAST   Disposition:   F/U will be arranged with the structural team  Signed, Lonni Cash, MD, [facility] 10/07/2023 2:45 PM    [facility] [address] [place], [place], [place]  [address] Phone: [phone]; Fax: [phone]

## 2023-10-07 NOTE — Progress Notes (Signed)
 Pre Surgical Assessment: 5 M Walk Test  20M=16.35ft  5 Meter Walk Test- trial 1: 6.73 seconds 5 Meter Walk Test- trial 2: 6.06 seconds 5 Meter Walk Test- trial 3: 5.63 seconds 5 Meter Walk Test Average: 6.14 seconds  __________________   STS score  Procedure Type: Isolated AVR Perioperative Outcome Estimate % Operative Mortality 0.889% Morbidity & Mortality 6.39% Stroke 1.5% Renal Failure 0.62% Reoperation 2.68% Prolonged Ventilation 2.02% Deep Sternal Wound Infection 0.042% Long Hospital Stay (>14 days) 2.24% Short Hospital Stay (<6 days)* 50.9%

## 2023-10-07 NOTE — Patient Instructions (Signed)
 Medication Instructions:  Your physician recommends that you continue on your current medications as directed. Please refer to the Current Medication list given to you today.  *If you need a refill on your cardiac medications before your next appointment, please call your pharmacy*  Lab Work: none If you have labs (blood work) drawn today and your tests are completely normal, you will receive your results only by: MyChart Message (if you have MyChart) OR A paper copy in the mail If you have any lab test that is abnormal or we need to change your treatment, we will call you to review the results.  Testing/Procedures: See CT instructions  Follow-Up: At [facility], you and your health needs are our priority.  As part of our continuing mission to provide you with exceptional heart care, our providers are all part of one team.  This team includes your primary Cardiologist (physician) and Advanced Practice Providers or APPs (Physician Assistants and Nurse Practitioners) who all work together to provide you with the care you need, when you need it.  Your next appointment:   To be determined  Provider:   Dr Verlin    We recommend signing up for the patient portal called MyChart.  Sign up information is provided on this After Visit Summary.  MyChart is used to connect with patients for Virtual Visits (Telemedicine).  Patients are able to view lab/test results, encounter notes, upcoming appointments, etc.  Non-urgent messages can be sent to your provider as well.   To learn more about what you can do with MyChart, go to [email or web address].   Other Instructions

## 2023-10-11 ENCOUNTER — Ambulatory Visit (HOSPITAL_COMMUNITY)
Admission: RE | Admit: 2023-10-11 | Discharge: 2023-10-11 | Disposition: A | Discharge status: Home / Self Care | Source: Ambulatory Visit | Attending: Cardiovascular Disease | Admitting: Cardiovascular Disease

## 2023-10-11 DIAGNOSIS — I2584 Coronary atherosclerosis due to calcified coronary lesion: Secondary | ICD-10-CM | POA: Diagnosis not present

## 2023-10-11 DIAGNOSIS — R918 Other nonspecific abnormal finding of lung field: Secondary | ICD-10-CM | POA: Insufficient documentation

## 2023-10-11 DIAGNOSIS — I350 Nonrheumatic aortic (valve) stenosis: Secondary | ICD-10-CM | POA: Diagnosis present

## 2023-10-11 DIAGNOSIS — I2510 Atherosclerotic heart disease of native coronary artery without angina pectoris: Secondary | ICD-10-CM | POA: Insufficient documentation

## 2023-10-11 MED ORDER — IOHEXOL 350 MG/ML SOLN
100.0000 mL | Freq: Once | INTRAVENOUS | Status: AC | PRN
  Administered 2023-10-11: 100 mL via INTRAVENOUS

## 2023-10-14 ENCOUNTER — Ambulatory Visit: Payer: Self-pay | Admitting: Physician Assistant

## 2023-10-14 ENCOUNTER — Encounter: Payer: Self-pay | Admitting: Medical Oncology

## 2023-10-14 ENCOUNTER — Other Ambulatory Visit: Payer: Self-pay | Admitting: Physician Assistant

## 2023-10-14 ENCOUNTER — Encounter: Payer: Self-pay | Admitting: Physician Assistant

## 2023-10-14 DIAGNOSIS — K669 Disorder of peritoneum, unspecified: Secondary | ICD-10-CM

## 2023-10-14 DIAGNOSIS — R918 Other nonspecific abnormal finding of lung field: Secondary | ICD-10-CM

## 2023-10-15 ENCOUNTER — Inpatient Hospital Stay

## 2023-10-15 ENCOUNTER — Encounter: Payer: Self-pay | Admitting: Medical Oncology

## 2023-10-15 ENCOUNTER — Encounter: Payer: Self-pay | Admitting: Physician Assistant

## 2023-10-15 ENCOUNTER — Inpatient Hospital Stay: Attending: Physician Assistant | Admitting: Physician Assistant

## 2023-10-15 VITALS — BP 130/82 | HR 86 | Temp 97.3°F | Resp 15 | Wt 192.2 lb

## 2023-10-15 DIAGNOSIS — E119 Type 2 diabetes mellitus without complications: Secondary | ICD-10-CM | POA: Insufficient documentation

## 2023-10-15 DIAGNOSIS — K668 Other specified disorders of peritoneum: Secondary | ICD-10-CM | POA: Insufficient documentation

## 2023-10-15 DIAGNOSIS — R935 Abnormal findings on diagnostic imaging of other abdominal regions, including retroperitoneum: Secondary | ICD-10-CM | POA: Diagnosis not present

## 2023-10-15 DIAGNOSIS — R918 Other nonspecific abnormal finding of lung field: Secondary | ICD-10-CM | POA: Insufficient documentation

## 2023-10-15 DIAGNOSIS — K669 Disorder of peritoneum, unspecified: Secondary | ICD-10-CM

## 2023-10-15 DIAGNOSIS — I482 Chronic atrial fibrillation, unspecified: Secondary | ICD-10-CM | POA: Insufficient documentation

## 2023-10-15 DIAGNOSIS — E785 Hyperlipidemia, unspecified: Secondary | ICD-10-CM | POA: Insufficient documentation

## 2023-10-15 DIAGNOSIS — R978 Other abnormal tumor markers: Secondary | ICD-10-CM

## 2023-10-15 DIAGNOSIS — R9389 Abnormal findings on diagnostic imaging of other specified body structures: Secondary | ICD-10-CM

## 2023-10-15 LAB — CEA (ACCESS): CEA (CHCC): 2.5 ng/mL (ref 0.00–5.00)

## 2023-10-15 LAB — CBC WITH DIFFERENTIAL (CANCER CENTER ONLY)
Abs Immature Granulocytes: 0.02 K/uL (ref 0.00–0.07)
Basophils Absolute: 0.1 K/uL (ref 0.0–0.1)
Basophils Relative: 1 %
Eosinophils Absolute: 0.2 K/uL (ref 0.0–0.5)
Eosinophils Relative: 3 %
HCT: 42.3 % (ref 39.0–52.0)
Hemoglobin: 14.1 g/dL (ref 13.0–17.0)
Immature Granulocytes: 0 %
Lymphocytes Relative: 18 %
Lymphs Abs: 1.1 K/uL (ref 0.7–4.0)
MCH: 29.4 pg (ref 26.0–34.0)
MCHC: 33.3 g/dL (ref 30.0–36.0)
MCV: 88.3 fL (ref 80.0–100.0)
Monocytes Absolute: 0.8 K/uL (ref 0.1–1.0)
Monocytes Relative: 13 %
Neutro Abs: 3.9 K/uL (ref 1.7–7.7)
Neutrophils Relative %: 65 %
Platelet Count: 183 K/uL (ref 150–400)
RBC: 4.79 MIL/uL (ref 4.22–5.81)
RDW: 13.4 % (ref 11.5–15.5)
WBC Count: 6.1 K/uL (ref 4.0–10.5)
nRBC: 0 % (ref 0.0–0.2)

## 2023-10-15 LAB — CMP (CANCER CENTER ONLY)
ALT: 22 U/L (ref 0–44)
AST: 21 U/L (ref 15–41)
Albumin: 4.3 g/dL (ref 3.5–5.0)
Alkaline Phosphatase: 53 U/L (ref 38–126)
Anion gap: 5 (ref 5–15)
BUN: 18 mg/dL (ref 8–23)
CO2: 28 mmol/L (ref 22–32)
Calcium: 9.6 mg/dL (ref 8.9–10.3)
Chloride: 106 mmol/L (ref 98–111)
Creatinine: 0.96 mg/dL (ref 0.61–1.24)
GFR, Estimated: >60 mL/min (ref >60)
Glucose, Bld: 107 mg/dL — ABNORMAL HIGH (ref 70–99)
Potassium: 4.1 mmol/L (ref 3.5–5.1)
Sodium: 139 mmol/L (ref 135–145)
Total Bilirubin: 0.6 mg/dL (ref 0.0–1.2)
Total Protein: 7 g/dL (ref 6.5–8.1)

## 2023-10-15 LAB — PSA: Prostatic Specific Antigen: 1.48 ng/mL (ref 0.00–4.00)

## 2023-10-15 LAB — LACTATE DEHYDROGENASE: LDH: 145 U/L (ref 98–192)

## 2023-10-15 NOTE — Patient Instructions (Signed)
 Diagnostic Clinic Office Visit Discharge Information and Instructions  Thank you for choosing [facility] for your healthcare needs.  Below is a summary of today's discussion, along with our contact information and an outline of what to expect next.  Reason for Visit:  Peritoneal nodules  Proposed Diagnostic Care Plan: Lab work PET/CT scan Possible biopsy  What to Expect: - Generally, when lab tests are ordered the results can take up to 1 week for results to be available.  At that point, we will contact you to discuss your results with you.  Unless there is a critical result, we will typically wait for all of your lab results to be available before contacting you. - If a biopsy is part of your Care Plan, those results can take on average 7-10 days to result.  Once results are available, we will contact you to discuss your pathology results and any next steps. - If you have additional imaging ordered, such as a CT Scan, MRI, Ultrasound, Bone Scan, or PET scan, your imaging will need to be authorized then scheduled with the earliest available appointment.  You may be asked to travel to another hospital within [facility] who has a sooner availability, please consider doing so if asked. - If you use MyChart, your results will be available to you in the MyChart portal.  Your provider will be in touch with you as soon as all of your results are available to be discussed.  Your Diagnostic Clinic Provider:  Dr. Onesimo and Johnston Police PA-C, contact number [phone] Your Diagnostic Navigator:  Colene Raider RN, contact number [phone]  If you or your caregiver have number blocking on your cell phones, please ensure the cancer center's numbers are not blocked.  If you are not a registered MyChart user, please consider enrolling in MyChart to receive your test results and visit notes.  You can also access your discharge instructions electronically.  MyChart also gives you an electronic means to  communicate with your Care Team instead of needing to call in to the cancer center.  We appreciate you trusting us  with your healthcare and look forward to partnering with you as we work to uncover what your potential diagnosis may be.  Please do not hesitate to reach out at any point with questions or concerns.

## 2023-10-15 NOTE — Progress Notes (Unsigned)
 Rapid Diagnostic Clinic [facility] [phone]   [phone]  INITIAL CONSULTATION:  Patient Care Team: Marchelle Clem Pitts, MD as PCP - General (Family Medicine) Court Dorn PARAS, MD as PCP - Cardiology (Cardiology) Harvey Margo CROME, MD (Inactive) as Consulting Physician (Gastroenterology) Golden Forestine BROCKS, RN as Oncology Nurse Navigator (Medical Oncology)  CHIEF COMPLAINTS/PURPOSE OF CONSULTATION:  Peritoneal nodules  HISTORY OF PRESENTING ILLNESS:  William Preston 73 y.o. male with medical history significant for aortic stenosis, dyslipidemia, type II diabetes, obstructive sleep apnea, atrial fibrillation who presents to the rapid diagnostic clinic for evaluation for peritoneal nodule seen on CTMr. Schake.  He is accompanied by his wife for this visit.  On review of the previous records, Mr. Luce was undergoing TAVR evaluation with cardiac CT and CTA of the chest/abdomen/pelvis on 10/11/2023.  Findings included subcentimeter pulmonary nodules in the left lower lobe and right middle lobe measuring 7 mm.  In addition, there were multiple peritoneal nodules present.  On exam today, Mr. Forstrom reports chronic fatigue that has been present for the past year.  He has lost approximately 5 to 10 pounds within the last 6 months which was intentional due to diet modifications.  He denies nausea, vomiting or abdominal pain.  He does have loose stools mainly in the morning secondary to metformin use.  He denies easy bruising or signs of active bleeding.  Patient has chronic shortness of breath with exertion but none at rest.  He denies fevers, chills, night sweats, chest pain, or cough.  He has no other complaints.  Rest of the 10 point ROS as below.  MEDICAL HISTORY:  Past Medical History:  Diagnosis Date   Aortic stenosis    Cardiac murmur 11/06/2012   Chronic atrial fibrillation 04/20/2008   Dyslipidemia 11/06/2012   Essential hypertension, benign  04/20/2008   History of colonic polyps 12/12/2020   History of COVID-19    Long term (current) use of anticoagulants 04/01/2012   Obstructive sleep apnea 04/20/2008   nightly CPAP use   Type II diabetes mellitus 11/06/2012    SURGICAL HISTORY: Past Surgical History:  Procedure Laterality Date   CARDIAC CATHETERIZATION  11/2003   normal   CARDIOVERSION  05/25/2008   successful   CATARACT EXTRACTION Bilateral    COLONOSCOPY N/A 06/20/2017   Surgeon: Harvey Margo CROME, MD; 6 polyps removed (tubular adenomas), diverticulosis in the descending, sigmoid, and rectosigmoid colon, external and internal hemorrhoids, redundant left colon.  Recommended repeat in 3 years.   COLONOSCOPY WITH PROPOFOL  N/A 01/30/2021   Procedure: COLONOSCOPY WITH PROPOFOL ;  Surgeon: Cindie Carlin POUR, DO;  [facility];  Service: Endoscopy;  Laterality: N/A;  7:30am   NM MYOCAR PERF WALL MOTION  10/29/2007   low risk   POLYPECTOMY  06/20/2017   Procedure: POLYPECTOMY;  Surgeon: Harvey Margo CROME, MD;  Location: [facility];  Service: Endoscopy;;  ascending colon x3, transverse colon polyp (cb)   POLYPECTOMY  01/30/2021   Procedure: POLYPECTOMY;  Surgeon: Cindie Carlin POUR, DO;  Location: [facility];  Service: Endoscopy;;   RIGHT/LEFT HEART CATH AND CORONARY ANGIOGRAPHY N/A 09/30/2023   Procedure: RIGHT/LEFT HEART CATH AND CORONARY ANGIOGRAPHY;  Surgeon: Wonda Sharper, MD;  Location: [facility] INV[facility]IVE CV LAB;  Service: Cardiovascular;  Laterality: N/A;   ruptured spleen     age 56, fell off a tractor that pushed him across a field   SPLENECTOMY, 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   UMBILICAL HERNIA REPAIR     US   ECHOCARDIOGRAPHY  11/19/2008   LA mod-severely dilated, RA mildly dilated,mild MR,mild to mod TR,trace AI    SOCIAL HISTORY: Social History   Socioeconomic History   Marital status: Married    Spouse name: Not on file   Number of children: Not on file   Years of education: 14   Highest education level: Some  college, no degree  Occupational History   Occupation: Retired    Comment: [employer] in [employer]  Tobacco Use   Smoking status: Never   Smokeless tobacco: Never  Vaping Use   Vaping status: Never Used  Substance and Sexual Activity   Alcohol use: No   Drug use: No   Sexual activity: Yes  Other Topics Concern   Not on file  Social History Narrative   Not on file   Social Drivers of Health   Financial Resource Strain: Not on file  Food Insecurity: No Food Insecurity (10/15/2023)   Hunger Vital Sign    Worried About Running Out of Food in the Last Year: Never true    Ran Out of Food in the Last Year: Never true  Transportation Needs: No Transportation Needs (10/15/2023)   PRAPARE - Administrator, Civil Service (Medical): No    Lack of Transportation (Non-Medical): No  Physical Activity: Not on file  Stress: Not on file  Social Connections: Not on file  Intimate Partner Violence: Not At Risk (10/15/2023)   Humiliation, Afraid, Rape, and Kick questionnaire    Fear of Current or Ex-Partner: No    Emotionally Abused: No    Physically Abused: No    Sexually Abused: No    FAMILY HISTORY: Family History  Problem Relation Age of Onset   Heart failure Mother    Heart attack Mother    Heart failure Father    Heart disease Father    Hyperlipidemia Sister    Hyperlipidemia Brother    Colon cancer Neg Hx    Colon polyps Neg Hx     ALLERGIES:  has no known allergies.  MEDICATIONS:  Current Outpatient Medications  Medication Sig Dispense Refill   apixaban  (ELIQUIS ) 5 MG TABS tablet Take 1 tablet (5 mg total) by mouth 2 (two) times daily. 180 tablet 1   Ascorbic Acid (VITAMIN C) 1000 MG tablet Take 1,000 mg by mouth daily.     cholecalciferol (VITAMIN D) 1000 units tablet Take 1,000 Units by mouth daily.     digoxin  (LANOXIN ) 0.25 MG tablet Take 1/2 (one-half) tablet by mouth once daily 45 tablet 3   diltiazem  (CARDIZEM  CD) 240 MG 24 hr capsule Take 1 capsule (240  mg total) by mouth daily. 90 capsule 3   doxazosin (CARDURA) 4 MG tablet Take 4 mg by mouth daily.     finasteride (PROSCAR) 5 MG tablet Take 5 mg by mouth daily.  0   furosemide  (LASIX ) 40 MG tablet Take 1 tablet (40 mg total) by mouth daily. (Patient taking differently: Take 40 mg by mouth daily as needed for fluid or edema.) 90 tablet 3   glyBURIDE (DIABETA) 2.5 MG tablet Take 2.5 mg by mouth 3 (three) times daily.     losartan  (COZAAR ) 50 MG tablet Take 1 tablet (50 mg total) by mouth daily. 90 tablet 3   MAGNESIUM GLYCINATE PO Take 500 mg by mouth daily.     metFORMIN (GLUCOPHAGE) 500 MG tablet Take 500 mg by mouth 3 (three) times daily.     metoprolol  tartrate (LOPRESSOR ) 25 MG tablet Take 1  tablet (25 mg total) by mouth daily. 90 tablet 3   Multiple Vitamins-Minerals (MULTIVITAMIN WITH MINERALS) tablet Take 1 tablet by mouth daily.     Multiple Vitamins-Minerals (PRESERVISION AREDS 2 PO) Take 1 capsule by mouth in the morning and at bedtime.     NON FORMULARY Pt uses a cpap nightly     pravastatin  (PRAVACHOL ) 80 MG tablet Take 1 tablet (80 mg total) by mouth daily. 90 tablet 4   vitamin B-12 (CYANOCOBALAMIN) 1000 MCG tablet Take 1,000 mcg by mouth daily.     vitamin E 400 UNIT capsule Take 400 Units by mouth daily.     diltiazem  (TIAZAC ) 240 MG 24 hr capsule Take 240 mg by mouth daily. (Patient not taking: Reported on 10/15/2023)     No current facility-administered medications for this visit.    REVIEW OF SYSTEMS:   Constitutional: ( - ) fevers, ( - )  chills , ( - ) night sweats Eyes: ( - ) blurriness of vision, ( - ) double vision, ( - ) watery eyes Ears, nose, mouth, throat, and face: ( - ) mucositis, ( - ) sore throat Respiratory: ( - ) cough, ( - ) dyspnea, ( - ) wheezes Cardiovascular: ( - ) palpitation, ( - ) chest discomfort, ( - ) lower extremity swelling Gastrointestinal:  ( - ) nausea, ( - ) heartburn, ( - ) change in bowel habits Skin: ( - ) abnormal skin  rashes Lymphatics: ( - ) new lymphadenopathy, ( - ) easy bruising Neurological: ( - ) numbness, ( - ) tingling, ( - ) new weaknesses Behavioral/Psych: ( - ) mood change, ( - ) new changes  All other systems were reviewed with the patient and are negative.  PHYSICAL EXAMINATION: ECOG PERFORMANCE STATUS: 1 - Symptomatic but completely ambulatory  Vitals:   10/15/23 1232  BP: 130/82  Pulse: 86  Resp: 15  Temp: (!) 97.3 F (36.3 C)  SpO2: 99%   Filed Weights   10/15/23 1232  Weight: 192 lb 3.2 oz (87.2 kg)    GENERAL: well appearing male in NAD  SKIN: skin color, texture, turgor are normal, no rashes or significant lesions EYES: conjunctiva are pink and non-injected, sclera clear OROPHARYNX: no exudate, no erythema; lips, buccal mucosa, and tongue normal  NECK: supple, non-tender LYMPH:  no palpable lymphadenopathy in the cervical or supraclavicular lymph nodes.  LUNGS: clear to auscultation and percussion with normal breathing effort HEART: regular rate & rhythm and no murmurs. Bilateral lower extremity edema ABDOMEN: soft, non-tender, non-distended, normal bowel sounds Musculoskeletal: no cyanosis of digits and no clubbing  PSYCH: alert & oriented x 3, fluent speech NEURO: no focal motor/sensory deficits  LABORATORY DATA:  I have reviewed the data as listed    Latest Ref Rng & Units 09/30/2023    9:59 AM 09/23/2023   11:49 AM 12/21/2020   11:47 AM  CBC  WBC 3.4 - 10.8 x10E3/uL  7.6  7.0   Hemoglobin 13.0 - 17.0 g/dL 86.9 - 82.9 g/dL 88.0    86.6  85.9  86.3   Hematocrit 39.0 - 52.0 % 39.0 - 52.0 % 35.0    39.0  43.3  40.8   Platelets 150 - 450 x10E3/uL  176  185        Latest Ref Rng & Units 09/30/2023    9:59 AM 09/23/2023   11:49 AM 01/25/2021   11:48 AM  CMP  Glucose 70 - 99 mg/dL  79  869  BUN 8 - 27 mg/dL  19  21   Creatinine 9.23 - 1.27 mg/dL  9.05  9.13   Sodium 864 - 145 mmol/L 135 - 145 mmol/L 137    138  140  138   Potassium 3.5 - 5.1 mmol/L 3.5 -  5.1 mmol/L 3.4    3.8  4.3  4.2   Chloride 96 - 106 mmol/L  101  106   CO2 20 - 29 mmol/L  23  25   Calcium 8.6 - 10.2 mg/dL  89.9  9.6      RADIOGRAPHIC STUDIES: I have personally reviewed the radiological images as listed and agreed with the findings in the report. CT ANGIO ABDOMEN PELVIS  W & WO CONTRAST Result Date: 10/13/2023 CLINICAL DATA:  Aortic valve replacement, preoperative evaluation EXAM: CTA ABDOMEN AND PELVIS WITHOUT AND WITH CONTRAST TECHNIQUE: Multidetector CT imaging of the abdomen and pelvis was performed using the standard protocol during bolus administration of intravenous contrast. Multiplanar reconstructed images and MIPs were obtained and reviewed to evaluate the vascular anatomy. RADIATION DOSE REDUCTION: This exam was performed according to the departmental dose-optimization program which includes automated exposure control, adjustment of the mA and/or kV according to patient size and/or use of iterative reconstruction technique. CONTRAST:  OMNIPAQUE  IOHEXOL  350 MG/ML SOLN COMPARISON:  None Available. FINDINGS: VASCULAR Aorta: Mild diffuse atherosclerotic changes. Minimal diameter measures 10 mm. Celiac: Patent. SMA: Patent. Renals: Patent. IMA: Patent. Inflow: Right common iliac artery demonstrates mild atherosclerotic changes with minimal diameter estimated at 7 mm. The right internal iliac artery is patent. Right external iliac artery is patent with minimal diameter measuring 7 mm. Left common iliac artery is patent with minimal diameter measuring 7 mm. Left internal iliac artery is patent. The left external iliac artery is patent with minimal diameter measuring 7 mm. Proximal Outflow: Right common femoral artery is patent with minimal diameter measuring 6 mm. Left common femoral artery is patent with minimal diameter measuring 7 mm. Veins: No obvious venous abnormality within the limitations of this arterial phase study. Review of the MIP images confirms the above  findings. NON-VASCULAR Lower chest: Reported separately. Hepatobiliary: Mild nodularity is noted along the liver surface. There is no intrahepatic or extrahepatic biliary ductal dilatation. Pancreas: Nothing significant. Spleen: Morphologically abnormal appearance of the spleen which may be secondary to prior trauma. Adrenals/Urinary Tract: The adrenal glands are within normal limits. No hydronephrosis or significant nephrolithiasis. The urinary bladder is grossly unremarkable. Stomach/Bowel: No dilated loops of bowel are appreciated. The appendix is well seen and within normal limits. Colonic diverticulosis. Lymphatic: Mildly prominent lymph nodes are present within the upper abdomen. For instance, a periportal lymph node is present which measures 9 mm on image 109 of CT series 309. Reproductive: Enlarged prostate. Other: Multiple small peritoneal nodules are present. A representative nodule is annotated on image 96 of CT series 309 measuring 6 mm. Musculoskeletal: No acute or significant osseous findings. IMPRESSION: 1. Minimal access vessel diameters detailed above. 2. Peritoneal nodules are present which are of uncertain significance. Consider oncology referral and PET CT evaluation for further characterization. These results will be called to the ordering clinician or representative by the Radiologist Assistant, and communication documented in the PACS or [employer]. Electronically Signed   By: Maude Naegeli M.D.   On: 10/13/2023 16:01   CT ANGIO CHEST AORTA W/CM & OR WO/CM Result Date: 10/13/2023 CLINICAL DATA:  Aortic valve replacement, preoperative evaluation EXAM: CT ANGIOGRAPHY CHEST WITH CONTRAST TECHNIQUE: Multidetector CT  imaging of the chest was performed using the standard protocol during bolus administration of intravenous contrast. Multiplanar CT image reconstructions and MIPs were obtained to evaluate the vascular anatomy. Multiplanar image (3D post-processing) reconstructions and MIPs were  obtained to evaluate the vascular anatomy. RADIATION DOSE REDUCTION: This exam was performed according to the departmental dose-optimization program which includes automated exposure control, adjustment of the mA and/or kV according to patient size and/or use of iterative reconstruction technique. CONTRAST:  OMNIPAQUE  IOHEXOL  350 MG/ML SOLN COMPARISON:  None Available. FINDINGS: Cardiovascular: Heart is enlarged. Ventricular thinning is present in the left ventricular apex. Atherosclerotic changes are present in the left and right coronary territories. Partially calcified aortic valve. The tubular ascending thoracic aorta measures 3 cm in diameter. Two vessel aortic arch. The proximal arch vessels are patent. The descending thoracic aorta is patent. Main pulmonary artery: Within normal limits. Mediastinum/Nodes: No significant lymphadenopathy. Mildly prominent lymphatic tissue is present in the hila. Lungs/Pleura: A noncalcified 7 mm left lower lobe pulmonary nodule is present on image 52 of series 309. a right middle lobe pulmonary nodule is present measuring 7 mm on image 53 of CT series 309. Upper Abdomen: Reported separately. Musculoskeletal: Degenerative changes in the imaged osseous structures. Review of the MIP images confirms the above findings. IMPRESSION: 1. No thoracic aortic aneurysm or aortic dissection. 2. Coronary artery atherosclerotic vascular disease. 3. Thinning of the left ventricular apex. 4. Pulmonary nodules measuring 7 mm. Non-contrast chest CT at 3-6 months is recommended. If the nodules are stable at time of repeat CT, then future CT at 18-24 months (from today's scan) is considered optional for low-risk patients, but is recommended for high-risk patients. This recommendation follows the consensus statement: Guidelines for Management of Incidental Pulmonary Nodules Detected on CT Images: From the Fleischner Society 2017; Radiology 2017; 284:228-243. Given the abdominal findings,  consideration should also be given toward oncology referral consideration for additional evaluation by PET-CT. Electronically Signed   By: Maude Naegeli M.D.   On: 10/13/2023 16:00   CT CORONARY MORPH W/CTA COR W/SCORE W/CA W/CM &/OR WO/CM Result Date: 10/11/2023 CLINICAL DATA:  Severe Aortic Stenosis. EXAM: Cardiac TAVR CT TECHNIQUE: A non-contrast, gated CT scan was obtained with axial slices of 2.5 mm through the heart for aortic valve scoring. A 120 kV retrospective, gated, contrast cardiac scan was obtained. Gantry rotation speed was 230 msec and collimation was 0.63 mm. Nitroglycerin was not given. A delayed scan was obtained to exclude left atrial appendage thrombus. The 3D dataset was reconstructed in systole with motion correction. The 3D data set was reconstructed in 5% intervals of the 0-95% of the R-R cycle. Systolic and diastolic phases were analyzed on a dedicated workstation using MPR, MIP, and VRT modes. The patient received 100 cc of contrast. FINDINGS: Aortic Valve: Valve Morphology: Tricuspid with asymmetric non cusp calcification and reduced excursion the planimeter valve area is 1.16 Sq cm consistent with severe aortic stenosis Annular and subannular calcification: None Aortic Valve Calcium Score: 1928 Perimembranous septal diameter: 6 mm Mitral Valve: No significant calcification Aortic Annulus Measurements- 30% phase Major annulus diameter: 27 mm Minor annulus diameter:23 mm Annular perimeter: 77 m Annular area: 4.64 cm2 Aortic Measurements- 80% phase Sinotubular Junction: 29 mm Ascending Thoracic Aorta: 31 mm Descending Thoracic Aorta: 23 mm Aortic atherosclerosis. Sinus of Valsalva Measurements: Right coronary cusp width: 28 mm Left coronary cusp width: 29 mm Non coronary cusp width: 29 mm Coronary Artery Height above Annulus: Left Main: 15 mm Left SoV height:  20 mm Right Coronary: 15 mm Right SoV height: 21 mm Optimum Fluoroscopic Angle for Delivery: LAO 5, CAU 5 Valves for structural  team consideration: 26 mm Sapien, scene saved 29 mm Evolut, slightly decreased right coronary sinus from recommended, average sinus dimensions slightly lower than recommended Non TAVR Valve Findings: Coronary Arteries: Normal coronary origin. Study not completed with nitroglycerin. Calcified coronary artery disease, unable to fully assess obstruction with this study. Please refer to recent left heart catheterization. Systemic veins: Normal anatomy, coronary sinus dilation. Main Pulmonary artery: Mild dilation, 29 mm Pulmonary veins: Right middle pulmonary vein- normal variant anatomy. Left atrial appendage: Filling defect in the distal tip of appendage without concurrent changes on calcium score scan. Suspect delayed filling but study cannot exclude thrombus. Interatrial septum: No communications Chamber dimensions: Right ventricular dilation, bi-atrial dilation. Pericardium: No calcification. Extra Cardiac Findings as per separate reporting. Image quality: Excellent. Noise artifact is: Limited to diastolic motion artifact for aortic and sinus evaluation. IMPRESSION: 1. Severe Aortic stenosis. Measurements for potential interventions as above. Stanly Leavens MD Electronically Signed   By: Stanly Leavens M.D.   On: 10/11/2023 12:37   CARDIAC CATHETERIZATION Result Date: 09/30/2023 1.  Moderate coronary artery disease with 70% PDA stenosis, 50% mid and 60% distal LAD stenoses, and 50% diagonal stenosis.  2.  Moderate coronary ectasia involving primarily the RCA and left circumflex 3.  Severe aortic stenosis with mean transvalvular gradient 41 mmHg, calculated aortic valve area 0.56 cm 4.  Right heart catheterization data: RA 2 mmHg RV 30/4 mmHg PA 30/15 mean 17 mmHg Wedge pressure 9 mmHg, 17 mmHg V wave LVEDP 7 mmHg Cardiac output 4.0 L/min, cardiac index 2.0 by Fick calculation Recommendations: Structural heart consultation for further evaluation and treatment of severe symptomatic aortic stenosis     ASSESSMENT & PLAN PAVLOS YON is a 73 y.o. male who presents to the rapid diagnostic clinic for evaluation for peritoneal nodules.  #Peritoneal nodules: --Possible etiologies including malignant process, infectious process or scar tissue from prior abdominal surgeries. --Patient has history of traumatic injury involving his spleen and umbilical hernia repair.  --Discussed PET/CT scan to further evaluate peritoneal nodules --Labs today to check CBC, CMP, LDH along with tumor markers including CA125, CEA and CA19-9.  --Will determine need for biopsy based on pending studies.  --RTC once workup is complete.   No orders of the defined types were placed in this encounter.   All questions were answered. The patient knows to call the clinic with any problems, questions or concerns.  I have spent a total of 60 minutes minutes of face-to-face and non-face-to-face time, preparing to see the patient, obtaining and/or reviewing separately obtained history, performing a medically appropriate examination, counseling and educating the patient, ordering medications/tests/procedures, referring and communicating with other health care professionals, documenting clinical information in the electronic health record, independently interpreting results and communicating results to the patient, and care coordination.   Johnston Police, PA-C Department of Hematology/Oncology [facility] Phone: [phone]  Patient was seen with Dr. Onesimo CURIA  .Patient was Personally and independently interviewed, examined and relevant elements of the history of present illness were reviewed in details and an assessment and plan was created. All elements of the patient's history of present illness , assessment and plan were discussed in details with Johnston Police, PA-C. The above documentation reflects our combined findings assessment and plan.   Gautam Kale MD MS

## 2023-10-15 NOTE — Progress Notes (Signed)
 [facility]  Patient presented to clinic, with spouse, for his/her scheduled appointment with PA-C [employer]. I introduced myself and provided them with my direct contact information. Patient and spouse were both encouraged to call me with any questions/concerns they may have.  Colene KYM Raider, RN, BSN, [facility] Oncology Nurse Navigator, [facility] 10/15/2023 2:37 PM

## 2023-10-16 LAB — CANCER ANTIGEN 19-9: CA 19-9: 26 U/mL (ref 0–35)

## 2023-10-16 LAB — CA 125: Cancer Antigen (CA) 125: 15.7 U/mL

## 2023-10-17 ENCOUNTER — Encounter: Payer: Self-pay | Admitting: Physician Assistant

## 2023-10-17 ENCOUNTER — Encounter: Payer: Self-pay | Admitting: Medical Oncology

## 2023-10-21 ENCOUNTER — Ambulatory Visit (HOSPITAL_COMMUNITY)
Admission: RE | Admit: 2023-10-21 | Discharge: 2023-10-21 | Disposition: A | Discharge status: Home / Self Care | Source: Ambulatory Visit | Attending: Physician Assistant | Admitting: Physician Assistant

## 2023-10-21 DIAGNOSIS — R9389 Abnormal findings on diagnostic imaging of other specified body structures: Secondary | ICD-10-CM | POA: Diagnosis present

## 2023-10-21 DIAGNOSIS — I2510 Atherosclerotic heart disease of native coronary artery without angina pectoris: Secondary | ICD-10-CM | POA: Insufficient documentation

## 2023-10-21 DIAGNOSIS — K669 Disorder of peritoneum, unspecified: Secondary | ICD-10-CM | POA: Insufficient documentation

## 2023-10-21 DIAGNOSIS — N400 Enlarged prostate without lower urinary tract symptoms: Secondary | ICD-10-CM | POA: Insufficient documentation

## 2023-10-21 DIAGNOSIS — I700 Atherosclerosis of aorta: Secondary | ICD-10-CM | POA: Insufficient documentation

## 2023-10-21 DIAGNOSIS — N210 Calculus in bladder: Secondary | ICD-10-CM | POA: Insufficient documentation

## 2023-10-21 LAB — GLUCOSE, CAPILLARY
Glucose-Capillary: 60 mg/dL — ABNORMAL LOW (ref 70–99)
Glucose-Capillary: 60 mg/dL — ABNORMAL LOW (ref 70–99)
Glucose-Capillary: 62 mg/dL — ABNORMAL LOW (ref 70–99)

## 2023-10-21 MED ORDER — FLUDEOXYGLUCOSE F - 18 (FDG) INJECTION
10.0000 | Freq: Once | INTRAVENOUS | Status: AC
  Administered 2023-10-21: 9.58 via INTRAVENOUS

## 2023-10-22 ENCOUNTER — Telehealth: Payer: Self-pay | Admitting: Physician Assistant

## 2023-10-22 ENCOUNTER — Encounter: Payer: Self-pay | Admitting: Medical Oncology

## 2023-10-22 DIAGNOSIS — K669 Disorder of peritoneum, unspecified: Secondary | ICD-10-CM

## 2023-10-22 NOTE — Telephone Encounter (Signed)
 I spoke to patient's wife, Joann Reichl, to review the PET scan results. 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 No further workup is required at this time. Discussed NM scan to confirm diagnosis versus follow up in 6 months with CT scan to monitor stability of peritoneal nodules. Patient prefers repeat CT in 6 months. I reviewed acute symptoms that require a follow up with our team including abdominal pain, distention or bowel habit changes.   Dr. Onesimo recommends proceeding with TAVR evaluation. Patient will follow up in our clinic in 6 months with CT abdomen/pelvis.   Patient's wife expressed understanding of the plan provided.

## 2023-10-22 NOTE — Progress Notes (Signed)
 [facility]  Late Entry: Patient informed, per DEVONNA Lis, labs resulted unremarkable and tumor markers were normal.   Spouse asking about getting back on track with structural heart team for patient to have TAVR. Informed her I will send message to Lis regarding her question. Patient gave verbal understanding. Denies further questions at this time.  Colene KYM Raider, RN, BSN, [facility] Oncology Nurse Navigator, [facility] 10/22/2023 10:33 AM

## 2023-10-23 ENCOUNTER — Encounter: Payer: Self-pay | Admitting: Physician Assistant

## 2023-11-21 NOTE — Progress Notes (Unsigned)
 [address]       [place] [address]             [phone]        [name] Medical Record #[id number] Date of Birth: [birth year]  Referring: Marchelle Clem Pitts, * Primary Care: Marchelle Clem Pitts, MD Primary Cardiologist:Jonathan Court, MD  Chief Complaint:   No chief complaint on file.   History of Present Illness:     ZYHIR CAPPELLA is a 73 y.o. male presents for surgical evaluation of ***   73 yo male with history of persistent atrial fibrillation, HLD, HTN, DM, sleep apnea and aortic stenosis who is here today as a new consult, referred by Dr. Court, for further discussion regarding his aortic stenosis and possible TAVR. He has persistent atrial fibrillation and is on Eliquis . He has been followed by Dr. Court for moderate aortic stenosis. Echo 07/16/23 with LVEF=60-65%. Mild to moderate mitral regurgitation. Severe aortic stenosis with mean gradient 30 mmHg, peak gradient 57.7 mmHg, AVA 0.67 cm2, SVI 29, DI 0.21. Cardiac cath 09/30/23 with moderate non-obstructive CAD with plans for medical management of his CAD.    He tells me today that he has progressive dyspnea on exertion, fatigue and LE edema. He has some dizziness with standing. No chest pain. He lives in [place], [place] with his wife. He is retired from set designer. He has no active dental issues.   Past Medical History:  Diagnosis Date   Aortic stenosis    Cardiac murmur 11/06/2012   Chronic atrial fibrillation 04/20/2008   Dyslipidemia 11/06/2012   Essential hypertension, benign 04/20/2008   History of colonic polyps 12/12/2020   History of COVID-19    Long term (current) use of anticoagulants 04/01/2012   Obstructive sleep apnea 04/20/2008   nightly CPAP use   Type II diabetes mellitus 11/06/2012    Past Surgical History:  Procedure Laterality Date   CARDIAC CATHETERIZATION  11/2003   normal   CARDIOVERSION  05/25/2008   successful   CATARACT EXTRACTION Bilateral     COLONOSCOPY N/A 06/20/2017   Surgeon: Harvey Margo CROME, MD; 6 polyps removed (tubular adenomas), diverticulosis in the descending, sigmoid, and rectosigmoid colon, external and internal hemorrhoids, redundant left colon.  Recommended repeat in 3 years.   COLONOSCOPY WITH PROPOFOL  N/A 01/30/2021   Procedure: COLONOSCOPY WITH PROPOFOL ;  Surgeon: Cindie Carlin POUR, DO;  [facility];  Service: Endoscopy;  Laterality: N/A;  7:30am   NM MYOCAR PERF WALL MOTION  10/29/2007   low risk   POLYPECTOMY  06/20/2017   Procedure: POLYPECTOMY;  Surgeon: Harvey Margo CROME, MD;  Location: [facility];  Service: Endoscopy;;  ascending colon x3, transverse colon polyp (cb)   POLYPECTOMY  01/30/2021   Procedure: POLYPECTOMY;  Surgeon: Cindie Carlin POUR, DO;  [facility]: [facility];  Service: Endoscopy;;   RIGHT/LEFT HEART CATH AND CORONARY ANGIOGRAPHY N/A 09/30/2023   Procedure: RIGHT/LEFT HEART CATH AND CORONARY ANGIOGRAPHY;  Surgeon: Wonda Sharper, MD;  Location: [facility] INVASIVE CV LAB;  Service: Cardiovascular;  Laterality: N/A;   ruptured spleen     age 24, fell off a tractor that pushed him across a field   SPLENECTOMY, 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 180 tablet 1   Ascorbic Acid (VITAMIN C) 1000 MG tablet Take 1,000 mg by mouth daily.     cholecalciferol (VITAMIN D) 1000 units tablet Take 1,000 Units by mouth daily.     digoxin  (LANOXIN ) 0.25 MG tablet Take 1/2 (one-half) tablet by mouth once daily 45 tablet 3    diltiazem  (CARDIZEM  CD) 240 MG 24 hr capsule Take 1 capsule (240 mg total) by mouth daily. 90 capsule 3   diltiazem  (TIAZAC ) 240 MG 24 hr capsule Take 240 mg by mouth daily. (Patient not taking: Reported on 10/15/2023)     doxazosin (CARDURA) 4 MG tablet Take 4 mg by mouth daily.     finasteride (PROSCAR) 5 MG tablet Take 5 mg by mouth daily.  0   furosemide  (LASIX ) 40 MG tablet Take 1 tablet (40 mg total) by mouth daily. (Patient taking differently: Take 40 mg by mouth daily as needed for fluid or edema.) 90 tablet 3   glyBURIDE (DIABETA) 2.5 MG tablet Take 2.5 mg by mouth 3 (three) times daily.     losartan  (COZAAR ) 50 MG tablet Take 1 tablet (50 mg total) by mouth daily. 90 tablet 3   MAGNESIUM GLYCINATE PO Take 500 mg by mouth daily.     metFORMIN (GLUCOPHAGE) 500 MG tablet Take 500 mg by mouth 3 (three) times daily.     metoprolol  tartrate (LOPRESSOR ) 25 MG tablet Take 1 tablet (25 mg total) by mouth daily. 90 tablet 3   Multiple Vitamins-Minerals (MULTIVITAMIN WITH MINERALS) tablet Take 1 tablet by mouth daily.     Multiple Vitamins-Minerals (PRESERVISION AREDS 2 PO) Take 1 capsule by mouth in the morning and at bedtime.     NON FORMULARY Pt uses a cpap nightly     pravastatin  (PRAVACHOL ) 80 MG tablet Take 1 tablet (80 mg total) by mouth daily. 90 tablet 4   vitamin B-12 (CYANOCOBALAMIN) 1000 MCG tablet Take 1,000 mcg by mouth daily.     vitamin E 400 UNIT capsule Take 400 Units by mouth daily.     No current facility-administered medications for this visit.    (Not in a hospital admission)   Family History  Problem Relation Age of Onset   Heart failure Mother    Heart attack Mother    Heart failure Father    Heart disease Father    Hyperlipidemia Sister    Prostate cancer Brother    Hyperlipidemia Brother    Colon cancer Neg Hx    Colon polyps Neg Hx      Review of Systems:   ROS    Physical Exam: There were no vitals taken for this visit. Physical Exam    Cardiac  Studies & Procedures   ______________________________________________________________________________________________ CARDIAC CATHETERIZATION  CARDIAC CATHETERIZATION 09/30/2023  Conclusion 1.  Moderate coronary artery disease with 70% PDA stenosis, 50% mid and 60% distal LAD stenoses, and 50% diagonal stenosis. 2.  Moderate coronary ectasia involving primarily the RCA and left circumflex 3.  Severe aortic stenosis with mean transvalvular gradient 41 mmHg, calculated aortic valve area 0.56 cm 4.  Right heart catheterization data: RA 2 mmHg RV 30/4 mmHg PA 30/15 mean 17 mmHg Wedge pressure 9  mmHg, 17 mmHg V wave LVEDP 7 mmHg Cardiac output 4.0 L/min, cardiac index 2.0 by Fick calculation  Recommendations: Structural heart consultation for further evaluation and treatment of severe symptomatic aortic stenosis  Findings Coronary Findings Diagnostic  Dominance: Right  Left Anterior Descending Prox LAD to Mid LAD lesion is 40% stenosed. Dist LAD lesion is 50% stenosed.  First Diagonal Branch 1st Diag lesion is 50% stenosed.  Right Coronary Artery Vessel is large. There is mild diffuse disease throughout the vessel. The vessel is ectatic. Large, dominant vessel with moderate ectasia in the proximal and mid vessel  Right Posterior Descending Artery RPDA lesion is 60% stenosed.  Intervention  No interventions have been documented.   STRESS TESTS  NM MYOCAR MULTI W/SPECT W 10/29/2007   ECHOCARDIOGRAM  ECHOCARDIOGRAM COMPLETE 07/16/2023  Narrative ECHOCARDIOGRAM REPORT    Patient Name:   JOHNROSS NABOZNY Date of Exam: 07/16/2023 Medical Rec #:  [id number]     Height:       69.0 in Accession #:    [id number]    Weight:       200.0 lb Date of Birth:  [birth year]     BSA:          2.066 m Patient Age:    73 years      BP:           120/60 mmHg Patient Gender: M             HR:           72 bpm. Exam Location:  [address]  Procedure: 2D Echo, Cardiac Doppler and Color  Doppler (Both Spectral and Color Flow Doppler were utilized during procedure).  Indications:    I35.0 AS  History:        Patient has prior history of Echocardiogram examinations, most recent 08/10/2022. AS, Arrythmias:Atrial Fibrillation; Risk Factors:Hypertension, Diabetes and Dyslipidemia.  Sonographer:    Elsie Bohr RDCS Referring Phys: [phone] JONATHAN J BERRY  IMPRESSIONS   1. Left ventricular ejection fraction, by estimation, is 60 to 65%. The left ventricle has normal function. The left ventricle has no regional wall motion abnormalities. There is moderate left ventricular hypertrophy. Left ventricular diastolic parameters are indeterminate. 2. Right ventricular systolic function is normal. The right ventricular size is normal. There is mildly elevated pulmonary artery systolic pressure. 3. Left atrial size was severely dilated. 4. Right atrial size was moderately dilated. 5. The mitral valve is degenerative. Mild to moderate mitral valve regurgitation. No evidence of mitral stenosis. 6. Tricuspid valve regurgitation is mild to moderate. 7. The aortic valve is tricuspid. There is moderate calcification of the aortic valve. There is severe thickening of the aortic valve. Aortic valve regurgitation is trivial. Moderate to severe aortic valve stenosis. Aortic valve area, by VTI measures 0.74 cm. Aortic valve mean gradient measures 30.0 mmHg. Aortic valve Vmax measures 3.80 m/s. 8. The inferior vena cava is dilated in size with <50% respiratory variability, suggesting right atrial pressure of 15 mmHg. 9. Cannot exclude a small PFO.  Comparison(s): Changes from prior study are noted. Severe AS by AVA and DI (0.21), moderate by Vmax and gradient. Stroke volume is low, suspect that there may be a component of low flow-low gradient AS and valve is severely stenotic.  FINDINGS Left Ventricle: Left ventricular ejection fraction, by estimation, is 60 to 65%. The left ventricle has normal  function. The left ventricle has no regional wall motion abnormalities. The left ventricular internal cavity size was normal in  size. There is moderate left ventricular hypertrophy. Left ventricular diastolic parameters are indeterminate.  Right Ventricle: The right ventricular size is normal. No increase in right ventricular wall thickness. Right ventricular systolic function is normal. There is mildly elevated pulmonary artery systolic pressure. The tricuspid regurgitant velocity is 2.71 m/s, and with an assumed right atrial pressure of 15 mmHg, the estimated right ventricular systolic pressure is 44.4 mmHg.  Left Atrium: Left atrial size was severely dilated.  Right Atrium: Right atrial size was moderately dilated.  Pericardium: There is no evidence of pericardial effusion.  Mitral Valve: The mitral valve is degenerative in appearance. Mild to moderate mitral annular calcification. Mild to moderate mitral valve regurgitation. No evidence of mitral valve stenosis.  Tricuspid Valve: The tricuspid valve is normal in structure. Tricuspid valve regurgitation is mild to moderate. No evidence of tricuspid stenosis.  Aortic Valve: Severe AS by AVA and DI (0.21), moderate by Vmax and gradient. The aortic valve is tricuspid. There is moderate calcification of the aortic valve. There is severe thickening of the aortic valve. Aortic valve regurgitation is trivial. Moderate to severe aortic stenosis is present. Aortic valve mean gradient measures 30.0 mmHg. Aortic valve peak gradient measures 57.7 mmHg. Aortic valve area, by VTI measures 0.74 cm.  Pulmonic Valve: The pulmonic valve was grossly normal. Pulmonic valve regurgitation is trivial. No evidence of pulmonic stenosis.  Aorta: The aortic root, ascending aorta and aortic arch are all structurally normal, with no evidence of dilitation or obstruction.  Venous: The inferior vena cava is dilated in size with less than 50% respiratory variability,  suggesting right atrial pressure of 15 mmHg.  IAS/Shunts: Cannot exclude a small PFO.   LEFT VENTRICLE PLAX 2D LVIDd:         4.60 cm   Diastology LVIDs:         3.20 cm   LV e' medial:    9.93 cm/s LV PW:         1.50 cm   LV E/e' medial:  12.9 LV IVS:        1.60 cm   LV e' lateral:   13.00 cm/s LVOT diam:     2.10 cm   LV E/e' lateral: 9.8 LV SV:         59 LV SV Index:   29 LVOT Area:     3.46 cm   RIGHT VENTRICLE            IVC RVSP:           37.4 mmHg  IVC diam: 2.60 cm  LEFT ATRIUM             Index        RIGHT ATRIUM           Index LA diam:        6.20 cm 3.00 cm/m   RA Pressure: 8.00 mmHg LA Vol (A2C):   95.0 ml 45.98 ml/m  RA Area:     27.30 cm LA Vol (A4C):   94.4 ml 45.69 ml/m  RA Volume:   91.10 ml  44.09 ml/m LA Biplane Vol: 97.5 ml 47.19 ml/m AORTIC VALVE AV Area (Vmax):    0.67 cm AV Area (Vmean):   0.76 cm AV Area (VTI):     0.74 cm AV Vmax:           379.80 cm/s AV Vmean:          249.000 cm/s AV VTI:  0.801 m AV Peak Grad:      57.7 mmHg AV Mean Grad:      30.0 mmHg LVOT Vmax:         73.76 cm/s LVOT Vmean:        54.920 cm/s LVOT VTI:          0.172 m LVOT/AV VTI ratio: 0.21  AORTA Ao Root diam: 3.50 cm Ao Asc diam:  3.30 cm  MITRAL VALVE                TRICUSPID VALVE MV Area (PHT): 4.68 cm     TR Peak grad:   29.4 mmHg MV Decel Time: 162 msec     TR Vmax:        271.00 cm/s MV E velocity: 127.80 cm/s  Estimated RAP:  8.00 mmHg RVSP:           37.4 mmHg  SHUNTS Systemic VTI:  0.17 m Systemic Diam: 2.10 cm  Shelda Bruckner MD Electronically signed by Shelda Bruckner MD Signature Date/Time: 07/16/2023/5:22:50 PM    Final      CT SCANS  CT CORONARY MORPH W/CTA COR W/SCORE 10/11/2023  Addendum 10/19/2023  2:24 PM ADDENDUM REPORT: 10/19/2023 14:22  EXAM: OVER-READ INTERPRETATION  CT CHEST  The following report is an over-read performed by radiologist Dr. Andrea Gasman of [facility]  [place] on 10/19/2023. This over-read does not include interpretation of cardiac or coronary anatomy or pathology. The coronary CTA interpretation by the cardiologist is attached.  COMPARISON:  Concurrent full field of view chest CT  FINDINGS: Please reference full field of view chest CT for complete thoracic assessment, reported separately. Aortic atherosclerosis. Some of the pulmonary nodules on concurrent chest CT are included in the field of view.  IMPRESSION: 1. Please reference concurrent full field of view chest CTA for complete assessment. 2.  Aortic Atherosclerosis (ICD10-I70.0).   Electronically Signed By: Andrea Gasman M.D. On: 10/19/2023 14:22  Narrative CLINICAL DATA:  Severe Aortic Stenosis.  EXAM: Cardiac TAVR CT  TECHNIQUE: A non-contrast, gated CT scan was obtained with axial slices of 2.5 mm through the heart for aortic valve scoring. A 120 kV retrospective, gated, contrast cardiac scan was obtained. Gantry rotation speed was 230 msec and collimation was 0.63 mm. Nitroglycerin was not given. A delayed scan was obtained to exclude left atrial appendage thrombus. The 3D dataset was reconstructed in systole with motion correction. The 3D data set was reconstructed in 5% intervals of the 0-95% of the R-R cycle. Systolic and diastolic phases were analyzed on a dedicated workstation using MPR, MIP, and VRT modes. The patient received 100 cc of contrast.  FINDINGS: Aortic Valve: Valve Morphology: Tricuspid with asymmetric non cusp calcification and reduced excursion the planimeter valve area is 1.16 Sq cm consistent with severe aortic stenosis  Annular and subannular calcification: None  Aortic Valve Calcium Score: 1928  Perimembranous septal diameter: 6 mm  Mitral Valve: No significant calcification  Aortic Annulus Measurements- 30% phase  Major annulus diameter: 27 mm  Minor annulus diameter:23 mm  Annular perimeter: 77 m  Annular area: 4.64  cm2  Aortic Measurements- 80% phase  Sinotubular Junction: 29 mm  Ascending Thoracic Aorta: 31 mm  Descending Thoracic Aorta: 23 mm  Aortic atherosclerosis.  Sinus of Valsalva Measurements:  Right coronary cusp width: 28 mm  Left coronary cusp width: 29 mm  Non coronary cusp width: 29 mm  Coronary Artery Height above Annulus:  Left Main: 15 mm  Left SoV height:  20 mm  Right Coronary: 15 mm  Right SoV height: 21 mm  Optimum Fluoroscopic Angle for Delivery: LAO 5, CAU 5  Valves for structural team consideration:  26 mm Sapien, scene saved  29 mm Evolut, slightly decreased right coronary sinus from recommended, average sinus dimensions slightly lower than recommended  Non TAVR Valve Findings:  Coronary Arteries: Normal coronary origin. Study not completed with nitroglycerin. Calcified coronary artery disease, unable to fully assess obstruction with this study. Please refer to recent left heart catheterization.  Systemic veins: Normal anatomy, coronary sinus dilation.  Main Pulmonary artery: Mild dilation, 29 mm  Pulmonary veins: Right middle pulmonary vein- normal variant anatomy.  Left atrial appendage: Filling defect in the distal tip of appendage without concurrent changes on calcium score scan. Suspect delayed filling but study cannot exclude thrombus.  Interatrial septum: No communications  Chamber dimensions: Right ventricular dilation, bi-atrial dilation.  Pericardium: No calcification.  Extra Cardiac Findings as per separate reporting.  Image quality: Excellent.  Noise artifact is: Limited to diastolic motion artifact for aortic and sinus evaluation.  IMPRESSION: 1. Severe Aortic stenosis. Measurements for potential interventions as above.  Stanly Leavens MD  Electronically Signed: By: Stanly Leavens M.D. On: 10/11/2023 12:37      ______________________________________________________________________________________________      ECG ***    I have independently reviewed the above radiologic studies and discussed with the patient   Recent Lab Findings: Lab Results  Component Value Date   WBC 6.1 10/15/2023   HGB 14.1 10/15/2023   HCT 42.3 10/15/2023   PLT 183 10/15/2023   GLUCOSE 107 (H) 10/15/2023   CHOL 147 09/08/2019   TRIG 238 (H) 09/08/2019   HDL 35 (L) 09/08/2019   LDLCALC 73 09/08/2019   ALT 22 10/15/2023   AST 21 10/15/2023   NA 139 10/15/2023   K 4.1 10/15/2023   CL 106 10/15/2023   CREATININE 0.96 10/15/2023   BUN 18 10/15/2023   CO2 28 10/15/2023   TSH 3.320 09/01/2020   INR 1.2 06/24/2017   HGBA1C 6.6 (H) 09/01/2020    Pulmonary nodule Possible LAA thrombus  Assessment / Plan:   73 y.o. male with severe aortic stenosis.  STS score: ***.  NYHA Class ***.  The risks and benefits of *** TAVR were discussed in detail.  We also discussed possibility of an emergent sternotomy to address any procedural complications.  Based on our discussion, we collectively decided that an emergent sternotomy would *** be indicated.  The patient is *** agreeable to proceed.  Based on my review of her LHC, echo, and CTA, I agree with the multidisciplinary plan to proceed with a *** TAVR.      I  spent {CHL ONC TIME VISIT - DTPQU:[id number]} counseling the patient face to face.   Linnie MALVA Rayas 11/21/2023 10:47 AM

## 2023-11-21 NOTE — H&P (View-Only) (Signed)
 [address]       [place] [address]             [phone]        William Preston Medical Record #[id number] Date of Birth: [birth year]  Referring: William Preston, * Primary Care: William Clem Pitts, MD Primary Cardiologist:William Court, MD  Chief Complaint:   No chief complaint on file.   History of Present Illness:     William Preston is a 73 y.o. male presents for surgical evaluation of ***   73 yo male with history of persistent atrial fibrillation, HLD, HTN, DM, sleep apnea and aortic stenosis who is here today as a new consult, referred by Dr. Court, for further discussion regarding his aortic stenosis and possible TAVR. He has persistent atrial fibrillation and is on Eliquis . He has been followed by Dr. Court for moderate aortic stenosis. Echo 07/16/23 with LVEF=60-65%. Mild to moderate mitral regurgitation. Severe aortic stenosis with mean gradient 30 mmHg, peak gradient 57.7 mmHg, AVA 0.67 cm2, SVI 29, DI 0.21. Cardiac cath 09/30/23 with moderate non-obstructive CAD with plans for medical management of his CAD.    He tells me today that he has progressive dyspnea on exertion, fatigue and LE edema. He has some dizziness with standing. No chest pain. He lives in [place], [place] with his wife. He is retired from set designer. He has no active dental issues.   Past Medical History:  Diagnosis Date   Aortic stenosis    Cardiac murmur 11/06/2012   Chronic atrial fibrillation 04/20/2008   Dyslipidemia 11/06/2012   Essential hypertension, benign 04/20/2008   History of colonic polyps 12/12/2020   History of COVID-19    Long term (current) use of anticoagulants 04/01/2012   Obstructive sleep apnea 04/20/2008   nightly CPAP use   Type II diabetes mellitus 11/06/2012    Past Surgical History:  Procedure Laterality Date   CARDIAC CATHETERIZATION  11/2003   normal   CARDIOVERSION  05/25/2008   successful   CATARACT EXTRACTION Bilateral     COLONOSCOPY N/A 06/20/2017   Surgeon: Harvey Margo CROME, MD; 6 polyps removed (tubular adenomas), diverticulosis in the descending, sigmoid, and rectosigmoid colon, external and internal hemorrhoids, redundant left colon.  Recommended repeat in 3 years.   COLONOSCOPY WITH PROPOFOL  N/A 01/30/2021   Procedure: COLONOSCOPY WITH PROPOFOL ;  Surgeon: Cindie Carlin POUR, DO;  Location: [facility];  Service: Endoscopy;  Laterality: N/A;  7:30am   NM MYOCAR PERF WALL MOTION  10/29/2007   low risk   POLYPECTOMY  06/20/2017   Procedure: POLYPECTOMY;  Surgeon: Harvey Margo CROME, MD;  Location: [facility];  Service: Endoscopy;;  ascending colon x3, transverse colon polyp (cb)   POLYPECTOMY  01/30/2021   Procedure: POLYPECTOMY;  Surgeon: Cindie Carlin POUR, DO;  Location: [facility];  Service: Endoscopy;;   RIGHT/LEFT HEART CATH AND CORONARY ANGIOGRAPHY N/A 09/30/2023   Procedure: RIGHT/LEFT HEART CATH AND CORONARY ANGIOGRAPHY;  Surgeon: Wonda Sharper, MD;  Location: [facility];  Service: Cardiovascular;  Laterality: N/A;   ruptured spleen     age 24, fell off a tractor that pushed him across a field   SPLENECTOMY, 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 180 tablet 1   Ascorbic Acid (VITAMIN C) 1000 MG tablet Take 1,000 mg by mouth daily.     cholecalciferol (VITAMIN D) 1000 units tablet Take 1,000 Units by mouth daily.     digoxin  (LANOXIN ) 0.25 MG tablet Take 1/2 (one-half) tablet by mouth once daily 45 tablet 3    diltiazem  (CARDIZEM  CD) 240 MG 24 hr capsule Take 1 capsule (240 mg total) by mouth daily. 90 capsule 3   diltiazem  (TIAZAC ) 240 MG 24 hr capsule Take 240 mg by mouth daily. (Patient not taking: Reported on 10/15/2023)     doxazosin (CARDURA) 4 MG tablet Take 4 mg by mouth daily.     finasteride (PROSCAR) 5 MG tablet Take 5 mg by mouth daily.  0   furosemide  (LASIX ) 40 MG tablet Take 1 tablet (40 mg total) by mouth daily. (Patient taking differently: Take 40 mg by mouth daily as needed for fluid or edema.) 90 tablet 3   glyBURIDE (DIABETA) 2.5 MG tablet Take 2.5 mg by mouth 3 (three) times daily.     losartan  (COZAAR ) 50 MG tablet Take 1 tablet (50 mg total) by mouth daily. 90 tablet 3   MAGNESIUM GLYCINATE PO Take 500 mg by mouth daily.     metFORMIN (GLUCOPHAGE) 500 MG tablet Take 500 mg by mouth 3 (three) times daily.     metoprolol  tartrate (LOPRESSOR ) 25 MG tablet Take 1 tablet (25 mg total) by mouth daily. 90 tablet 3   Multiple Vitamins-Minerals (MULTIVITAMIN WITH MINERALS) tablet Take 1 tablet by mouth daily.     Multiple Vitamins-Minerals (PRESERVISION AREDS 2 PO) Take 1 capsule by mouth in the morning and at bedtime.     NON FORMULARY Pt uses a cpap nightly     pravastatin  (PRAVACHOL ) 80 MG tablet Take 1 tablet (80 mg total) by mouth daily. 90 tablet 4   vitamin B-12 (CYANOCOBALAMIN) 1000 MCG tablet Take 1,000 mcg by mouth daily.     vitamin E 400 UNIT capsule Take 400 Units by mouth daily.     No current facility-administered medications for this visit.    (Not in a hospital admission)   Family History  Problem Relation Age of Onset   Heart failure Mother    Heart attack Mother    Heart failure Father    Heart disease Father    Hyperlipidemia Sister    Prostate cancer Brother    Hyperlipidemia Brother    Colon cancer Neg Hx    Colon polyps Neg Hx      Review of Systems:   ROS    Physical Exam: There were no vitals taken for this visit. Physical Exam    Cardiac  Studies & Procedures   ______________________________________________________________________________________________ CARDIAC CATHETERIZATION  CARDIAC CATHETERIZATION 09/30/2023  Conclusion 1.  Moderate coronary artery disease with 70% PDA stenosis, 50% mid and 60% distal LAD stenoses, and 50% diagonal stenosis. 2.  Moderate coronary ectasia involving primarily the RCA and left circumflex 3.  Severe aortic stenosis with mean transvalvular gradient 41 mmHg, calculated aortic valve area 0.56 cm 4.  Right heart catheterization data: RA 2 mmHg RV 30/4 mmHg PA 30/15 mean 17 mmHg Wedge pressure 9  mmHg, 17 mmHg V wave LVEDP 7 mmHg Cardiac output 4.0 L/min, cardiac index 2.0 by Fick calculation  Recommendations: Structural heart consultation for further evaluation and treatment of severe symptomatic aortic stenosis  Findings Coronary Findings Diagnostic  Dominance: Right  Left Anterior Descending Prox LAD to Mid LAD lesion is 40% stenosed. Dist LAD lesion is 50% stenosed.  First Diagonal Branch 1st Diag lesion is 50% stenosed.  Right Coronary Artery Vessel is large. There is mild diffuse disease throughout the vessel. The vessel is ectatic. Large, dominant vessel with moderate ectasia in the proximal and mid vessel  Right Posterior Descending Artery RPDA lesion is 60% stenosed.  Intervention  No interventions have been documented.   STRESS TESTS  NM MYOCAR MULTI W/SPECT W 10/29/2007   ECHOCARDIOGRAM  ECHOCARDIOGRAM COMPLETE 07/16/2023  Narrative ECHOCARDIOGRAM REPORT    Patient Name:   JOHNROSS NABOZNY Date of Exam: 07/16/2023 Medical Rec #:  [id number]     Height:       69.0 in Accession #:    [id number]    Weight:       200.0 lb Date of Birth:  [birth year]     BSA:          2.066 m Patient Age:    73 years      BP:           120/60 mmHg Patient Gender: M             HR:           72 bpm. Exam Location:  [address]  Procedure: 2D Echo, Cardiac Doppler and Color  Doppler (Both Spectral and Color Flow Doppler were utilized during procedure).  Indications:    I35.0 AS  History:        Patient has prior history of Echocardiogram examinations, most recent 08/10/2022. AS, Arrythmias:Atrial Fibrillation; Risk Factors:Hypertension, Diabetes and Dyslipidemia.  Sonographer:    Elsie Bohr RDCS Referring Phys: [phone] William J BERRY  IMPRESSIONS   1. Left ventricular ejection fraction, by estimation, is 60 to 65%. The left ventricle has normal function. The left ventricle has no regional wall motion abnormalities. There is moderate left ventricular hypertrophy. Left ventricular diastolic parameters are indeterminate. 2. Right ventricular systolic function is normal. The right ventricular size is normal. There is mildly elevated pulmonary artery systolic pressure. 3. Left atrial size was severely dilated. 4. Right atrial size was moderately dilated. 5. The mitral valve is degenerative. Mild to moderate mitral valve regurgitation. No evidence of mitral stenosis. 6. Tricuspid valve regurgitation is mild to moderate. 7. The aortic valve is tricuspid. There is moderate calcification of the aortic valve. There is severe thickening of the aortic valve. Aortic valve regurgitation is trivial. Moderate to severe aortic valve stenosis. Aortic valve area, by VTI measures 0.74 cm. Aortic valve mean gradient measures 30.0 mmHg. Aortic valve Vmax measures 3.80 m/s. 8. The inferior vena cava is dilated in size with <50% respiratory variability, suggesting right atrial pressure of 15 mmHg. 9. Cannot exclude a small PFO.  Comparison(s): Changes from prior study are noted. Severe AS by AVA and DI (0.21), moderate by Vmax and gradient. Stroke volume is low, suspect that there may be a component of low flow-low gradient AS and valve is severely stenotic.  FINDINGS Left Ventricle: Left ventricular ejection fraction, by estimation, is 60 to 65%. The left ventricle has normal  function. The left ventricle has no regional wall motion abnormalities. The left ventricular internal cavity size was normal in  size. There is moderate left ventricular hypertrophy. Left ventricular diastolic parameters are indeterminate.  Right Ventricle: The right ventricular size is normal. No increase in right ventricular wall thickness. Right ventricular systolic function is normal. There is mildly elevated pulmonary artery systolic pressure. The tricuspid regurgitant velocity is 2.71 m/s, and with an assumed right atrial pressure of 15 mmHg, the estimated right ventricular systolic pressure is 44.4 mmHg.  Left Atrium: Left atrial size was severely dilated.  Right Atrium: Right atrial size was moderately dilated.  Pericardium: There is no evidence of pericardial effusion.  Mitral Valve: The mitral valve is degenerative in appearance. Mild to moderate mitral annular calcification. Mild to moderate mitral valve regurgitation. No evidence of mitral valve stenosis.  Tricuspid Valve: The tricuspid valve is normal in structure. Tricuspid valve regurgitation is mild to moderate. No evidence of tricuspid stenosis.  Aortic Valve: Severe AS by AVA and DI (0.21), moderate by Vmax and gradient. The aortic valve is tricuspid. There is moderate calcification of the aortic valve. There is severe thickening of the aortic valve. Aortic valve regurgitation is trivial. Moderate to severe aortic stenosis is present. Aortic valve mean gradient measures 30.0 mmHg. Aortic valve peak gradient measures 57.7 mmHg. Aortic valve area, by VTI measures 0.74 cm.  Pulmonic Valve: The pulmonic valve was grossly normal. Pulmonic valve regurgitation is trivial. No evidence of pulmonic stenosis.  Aorta: The aortic root, ascending aorta and aortic arch are all structurally normal, with no evidence of dilitation or obstruction.  Venous: The inferior vena cava is dilated in size with less than 50% respiratory variability,  suggesting right atrial pressure of 15 mmHg.  IAS/Shunts: Cannot exclude a small PFO.   LEFT VENTRICLE PLAX 2D LVIDd:         4.60 cm   Diastology LVIDs:         3.20 cm   LV e' medial:    9.93 cm/s LV PW:         1.50 cm   LV E/e' medial:  12.9 LV IVS:        1.60 cm   LV e' lateral:   13.00 cm/s LVOT diam:     2.10 cm   LV E/e' lateral: 9.8 LV SV:         59 LV SV Index:   29 LVOT Area:     3.46 cm   RIGHT VENTRICLE            IVC RVSP:           37.4 mmHg  IVC diam: 2.60 cm  LEFT ATRIUM             Index        RIGHT ATRIUM           Index LA diam:        6.20 cm 3.00 cm/m   RA Pressure: 8.00 mmHg LA Vol (A2C):   95.0 ml 45.98 ml/m  RA Area:     27.30 cm LA Vol (A4C):   94.4 ml 45.69 ml/m  RA Volume:   91.10 ml  44.09 ml/m LA Biplane Vol: 97.5 ml 47.19 ml/m AORTIC VALVE AV Area (Vmax):    0.67 cm AV Area (Vmean):   0.76 cm AV Area (VTI):     0.74 cm AV Vmax:           379.80 cm/s AV Vmean:          249.000 cm/s AV VTI:  0.801 m AV Peak Grad:      57.7 mmHg AV Mean Grad:      30.0 mmHg LVOT Vmax:         73.76 cm/s LVOT Vmean:        54.920 cm/s LVOT VTI:          0.172 m LVOT/AV VTI ratio: 0.21  AORTA Ao Root diam: 3.50 cm Ao Asc diam:  3.30 cm  MITRAL VALVE                TRICUSPID VALVE MV Area (PHT): 4.68 cm     TR Peak grad:   29.4 mmHg MV Decel Time: 162 msec     TR Vmax:        271.00 cm/s MV E velocity: 127.80 cm/s  Estimated RAP:  8.00 mmHg RVSP:           37.4 mmHg  SHUNTS Systemic VTI:  0.17 m Systemic Diam: 2.10 cm  Shelda Bruckner MD Electronically signed by Shelda Bruckner MD Signature Date/Time: 07/16/2023/5:22:50 PM    Final      CT SCANS  CT CORONARY MORPH W/CTA COR W/SCORE 10/11/2023  Addendum 10/19/2023  2:24 PM ADDENDUM REPORT: 10/19/2023 14:22  EXAM: OVER-READ INTERPRETATION  CT CHEST  The following report is an over-read performed by radiologist Dr. Andrea Gasman of [facility]  [place] on 10/19/2023. This over-read does not include interpretation of cardiac or coronary anatomy or pathology. The coronary CTA interpretation by the cardiologist is attached.  COMPARISON:  Concurrent full field of view chest CT  FINDINGS: Please reference full field of view chest CT for complete thoracic assessment, reported separately. Aortic atherosclerosis. Some of the pulmonary nodules on concurrent chest CT are included in the field of view.  IMPRESSION: 1. Please reference concurrent full field of view chest CTA for complete assessment. 2.  Aortic Atherosclerosis (ICD10-I70.0).   Electronically Signed By: Andrea Gasman M.D. On: 10/19/2023 14:22  Narrative CLINICAL DATA:  Severe Aortic Stenosis.  EXAM: Cardiac TAVR CT  TECHNIQUE: A non-contrast, gated CT scan was obtained with axial slices of 2.5 mm through the heart for aortic valve scoring. A 120 kV retrospective, gated, contrast cardiac scan was obtained. Gantry rotation speed was 230 msec and collimation was 0.63 mm. Nitroglycerin was not given. A delayed scan was obtained to exclude left atrial appendage thrombus. The 3D dataset was reconstructed in systole with motion correction. The 3D data set was reconstructed in 5% intervals of the 0-95% of the R-R cycle. Systolic and diastolic phases were analyzed on a dedicated workstation using MPR, MIP, and VRT modes. The patient received 100 cc of contrast.  FINDINGS: Aortic Valve: Valve Morphology: Tricuspid with asymmetric non cusp calcification and reduced excursion the planimeter valve area is 1.16 Sq cm consistent with severe aortic stenosis  Annular and subannular calcification: None  Aortic Valve Calcium Score: 1928  Perimembranous septal diameter: 6 mm  Mitral Valve: No significant calcification  Aortic Annulus Measurements- 30% phase  Major annulus diameter: 27 mm  Minor annulus diameter:23 mm  Annular perimeter: 77 m  Annular area: 4.64  cm2  Aortic Measurements- 80% phase  Sinotubular Junction: 29 mm  Ascending Thoracic Aorta: 31 mm  Descending Thoracic Aorta: 23 mm  Aortic atherosclerosis.  Sinus of Valsalva Measurements:  Right coronary cusp width: 28 mm  Left coronary cusp width: 29 mm  Non coronary cusp width: 29 mm  Coronary Artery Height above Annulus:  Left Main: 15 mm  Left SoV height:  20 mm  Right Coronary: 15 mm  Right SoV height: 21 mm  Optimum Fluoroscopic Angle for Delivery: LAO 5, CAU 5  Valves for structural team consideration:  26 mm Sapien, scene saved  29 mm Evolut, slightly decreased right coronary sinus from recommended, average sinus dimensions slightly lower than recommended  Non TAVR Valve Findings:  Coronary Arteries: Normal coronary origin. Study not completed with nitroglycerin. Calcified coronary artery disease, unable to fully assess obstruction with this study. Please refer to recent left heart catheterization.  Systemic veins: Normal anatomy, coronary sinus dilation.  Main Pulmonary artery: Mild dilation, 29 mm  Pulmonary veins: Right middle pulmonary vein- normal variant anatomy.  Left atrial appendage: Filling defect in the distal tip of appendage without concurrent changes on calcium score scan. Suspect delayed filling but study cannot exclude thrombus.  Interatrial septum: No communications  Chamber dimensions: Right ventricular dilation, bi-atrial dilation.  Pericardium: No calcification.  Extra Cardiac Findings as per separate reporting.  Image quality: Excellent.  Noise artifact is: Limited to diastolic motion artifact for aortic and sinus evaluation.  IMPRESSION: 1. Severe Aortic stenosis. Measurements for potential interventions as above.  Stanly Leavens MD  Electronically Signed: By: Stanly Leavens M.D. On: 10/11/2023 12:37      ______________________________________________________________________________________________      ECG ***    I have independently reviewed the above radiologic studies and discussed with the patient   Recent Lab Findings: Lab Results  Component Value Date   WBC 6.1 10/15/2023   HGB 14.1 10/15/2023   HCT 42.3 10/15/2023   PLT 183 10/15/2023   GLUCOSE 107 (H) 10/15/2023   CHOL 147 09/08/2019   TRIG 238 (H) 09/08/2019   HDL 35 (L) 09/08/2019   LDLCALC 73 09/08/2019   ALT 22 10/15/2023   AST 21 10/15/2023   NA 139 10/15/2023   K 4.1 10/15/2023   CL 106 10/15/2023   CREATININE 0.96 10/15/2023   BUN 18 10/15/2023   CO2 28 10/15/2023   TSH 3.320 09/01/2020   INR 1.2 06/24/2017   HGBA1C 6.6 (H) 09/01/2020    Pulmonary nodule Possible LAA thrombus  Assessment / Plan:   73 y.o. male with severe aortic stenosis.  STS score: ***.  NYHA Class ***.  The risks and benefits of *** TAVR were discussed in detail.  We also discussed possibility of an emergent sternotomy to address any procedural complications.  Based on our discussion, we collectively decided that an emergent sternotomy would *** be indicated.  The patient is *** agreeable to proceed.  Based on my review of her LHC, echo, and CTA, I agree with the multidisciplinary plan to proceed with a *** TAVR.      I  spent {CHL ONC TIME VISIT - DTPQU:[id number]} counseling the patient face to face.   Linnie MALVA Rayas 11/21/2023 10:47 AM

## 2023-11-22 ENCOUNTER — Ambulatory Visit
Attending: Thoracic Surgery (Cardiothoracic Vascular Surgery) | Admitting: Thoracic Surgery (Cardiothoracic Vascular Surgery)

## 2023-11-22 ENCOUNTER — Encounter: Payer: Self-pay | Admitting: Thoracic Surgery (Cardiothoracic Vascular Surgery)

## 2023-11-22 VITALS — BP 124/62 | HR 55 | Resp 20 | Ht 69.0 in | Wt 186.4 lb

## 2023-11-22 DIAGNOSIS — I350 Nonrheumatic aortic (valve) stenosis: Secondary | ICD-10-CM | POA: Diagnosis present

## 2023-11-28 ENCOUNTER — Other Ambulatory Visit: Payer: Self-pay

## 2023-11-28 DIAGNOSIS — I350 Nonrheumatic aortic (valve) stenosis: Secondary | ICD-10-CM

## 2023-12-06 ENCOUNTER — Ambulatory Visit (HOSPITAL_COMMUNITY)
Admission: RE | Admit: 2023-12-06 | Discharge: 2023-12-06 | Disposition: A | Discharge status: Home / Self Care | Source: Ambulatory Visit | Attending: Cardiovascular Disease | Admitting: Cardiovascular Disease

## 2023-12-06 ENCOUNTER — Other Ambulatory Visit: Payer: Self-pay

## 2023-12-06 ENCOUNTER — Encounter (HOSPITAL_COMMUNITY)
Admission: RE | Admit: 2023-12-06 | Discharge: 2023-12-06 | Disposition: A | Discharge status: Home / Self Care | Source: Ambulatory Visit | Attending: Cardiovascular Disease | Admitting: Cardiovascular Disease

## 2023-12-06 DIAGNOSIS — I350 Nonrheumatic aortic (valve) stenosis: Secondary | ICD-10-CM

## 2023-12-06 DIAGNOSIS — Z01818 Encounter for other preprocedural examination: Secondary | ICD-10-CM

## 2023-12-06 DIAGNOSIS — I4891 Unspecified atrial fibrillation: Secondary | ICD-10-CM | POA: Insufficient documentation

## 2023-12-06 LAB — COMPREHENSIVE METABOLIC PANEL WITH GFR
ALT: 23 U/L (ref 0–44)
AST: 20 U/L (ref 15–41)
Albumin: 3.7 g/dL (ref 3.5–5.0)
Alkaline Phosphatase: 47 U/L (ref 38–126)
Anion gap: 11 (ref 5–15)
BUN: 23 mg/dL (ref 8–23)
CO2: 23 mmol/L (ref 22–32)
Calcium: 9.5 mg/dL (ref 8.9–10.3)
Chloride: 104 mmol/L (ref 98–111)
Creatinine, Ser: 0.94 mg/dL (ref 0.61–1.24)
GFR, Estimated: >60 mL/min (ref >60)
Glucose, Bld: 77 mg/dL (ref 70–99)
Potassium: 4.1 mmol/L (ref 3.5–5.1)
Sodium: 138 mmol/L (ref 135–145)
Total Bilirubin: 0.8 mg/dL (ref 0.0–1.2)
Total Protein: 6.8 g/dL (ref 6.5–8.1)

## 2023-12-06 LAB — TYPE AND SCREEN
ABO/RH(D): A POS
Antibody Screen: NEGATIVE

## 2023-12-06 LAB — SURGICAL PCR SCREEN
MRSA, PCR: NEGATIVE
Staphylococcus aureus: NEGATIVE

## 2023-12-06 LAB — CBC
HCT: 44.4 % (ref 39.0–52.0)
Hemoglobin: 14.3 g/dL (ref 13.0–17.0)
MCH: 28.8 pg (ref 26.0–34.0)
MCHC: 32.2 g/dL (ref 30.0–36.0)
MCV: 89.3 fL (ref 80.0–100.0)
Platelets: 185 K/uL (ref 150–400)
RBC: 4.97 MIL/uL (ref 4.22–5.81)
RDW: 14.6 % (ref 11.5–15.5)
WBC: 7.5 K/uL (ref 4.0–10.5)
nRBC: 0 % (ref 0.0–0.2)

## 2023-12-06 LAB — PROTIME-INR
INR: 1.4 — ABNORMAL HIGH (ref 0.8–1.2)
Prothrombin Time: 17.8 s — ABNORMAL HIGH (ref 11.4–15.2)

## 2023-12-06 NOTE — Progress Notes (Addendum)
 All consents signed by patient at PAT lab appointment. Pt was sent home with printed copy of surgical instructions and CHG soap/CHG soap instructions. All instructions reviewed with patient and questions answered.  Patients chart send to anesthesia for review. Pt denies any respiratory illness/infection in the last two months.   Pt unable to give urine same at lab appointment. Will need to be collected DOS. Order modified.

## 2023-12-09 MED ORDER — DEXMEDETOMIDINE HCL IN NACL 400 MCG/100ML IV SOLN
0.1000 ug/kg/h | INTRAVENOUS | Status: AC
  Administered 2023-12-10: 1 ug/kg/h via INTRAVENOUS
  Administered 2023-12-10: 84.6 ug via INTRAVENOUS
  Filled 2023-12-09: qty 100

## 2023-12-09 MED ORDER — HEPARIN 30,000 UNITS/1000 ML (OHS) CELLSAVER SOLUTION
Status: DC
  Filled 2023-12-09: qty 1000

## 2023-12-09 MED ORDER — MAGNESIUM SULFATE 50 % IJ SOLN
40.0000 meq | INTRAMUSCULAR | Status: DC
  Filled 2023-12-09: qty 9.85

## 2023-12-09 MED ORDER — NOREPINEPHRINE 4 MG/250ML-% IV SOLN
0.0000 ug/min | INTRAVENOUS | Status: AC
  Administered 2023-12-10: 5 ug/min via INTRAVENOUS
  Filled 2023-12-09: qty 250

## 2023-12-09 MED ORDER — POTASSIUM CHLORIDE 2 MEQ/ML IV SOLN
80.0000 meq | INTRAVENOUS | Status: DC
  Filled 2023-12-09: qty 40

## 2023-12-09 MED ORDER — CEFAZOLIN SODIUM-DEXTROSE 2-4 GM/100ML-% IV SOLN
2.0000 g | INTRAVENOUS | Status: AC
  Administered 2023-12-10: 2 g via INTRAVENOUS
  Filled 2023-12-09: qty 100

## 2023-12-09 NOTE — Anesthesia Preprocedure Evaluation (Signed)
 Anesthesia Evaluation  Patient identified by MRN, date of birth, ID band Patient awake    Reviewed: Allergy & Precautions, H&P , NPO status , Patient's Chart, lab work & pertinent test results  Airway Mallampati: II  TM Distance: >3 FB Neck ROM: Full    Dental no notable dental hx.    Pulmonary sleep apnea    Pulmonary exam normal breath sounds clear to auscultation       Cardiovascular hypertension, (-) angina (-) Past MI Normal cardiovascular exam+ dysrhythmias Atrial Fibrillation + Valvular Problems/Murmurs AS  Rhythm:Regular Rate:Normal  Severe Aortic Stenosis  07/2023: IMPRESSIONS     1. Left ventricular ejection fraction, by estimation, is 60 to 65%. The  left ventricle has normal function. The left ventricle has no regional  wall motion abnormalities. There is moderate left ventricular hypertrophy.  Left ventricular diastolic  parameters are indeterminate.   2. Right ventricular systolic function is normal. The right ventricular  size is normal. There is mildly elevated pulmonary artery systolic  pressure.   3. Left atrial size was severely dilated.   4. Right atrial size was moderately dilated.   5. The mitral valve is degenerative. Mild to moderate mitral valve  regurgitation. No evidence of mitral stenosis.   6. Tricuspid valve regurgitation is mild to moderate.   7. The aortic valve is tricuspid. There is moderate calcification of the  aortic valve. There is severe thickening of the aortic valve. Aortic valve  regurgitation is trivial. Moderate to severe aortic valve stenosis. Aortic  valve area, by VTI measures  0.74 cm. Aortic valve mean gradient measures 30.0 mmHg. Aortic valve Vmax  measures 3.80 m/s.   8. The inferior vena cava is dilated in size with <50% respiratory  variability, suggesting right atrial pressure of 15 mmHg.   9. Cannot exclude a small PFO.     Neuro/Psych neg Seizures  negative psych  ROS   GI/Hepatic negative GI ROS, Neg liver ROS,,,  Endo/Other  diabetes, Type 2    Renal/GU negative Renal ROS  negative genitourinary   Musculoskeletal negative musculoskeletal ROS (+)    Abdominal   Peds negative pediatric ROS (+)  Hematology negative hematology ROS (+)   Anesthesia Other Findings   Reproductive/Obstetrics negative OB ROS                              Anesthesia Physical Anesthesia Plan  ASA: 4  Anesthesia Plan: MAC   Post-op Pain Management: Precedex    Induction: Intravenous  PONV Risk Score and Plan: 1 and TIVA and Treatment may vary due to age or medical condition  Airway Management Planned: Natural Airway and Simple Face Mask  Additional Equipment:   Intra-op Plan:   Post-operative Plan:   Informed Consent: I have reviewed the patients History and Physical, chart, labs and discussed the procedure including the risks, benefits and alternatives for the proposed anesthesia with the patient or authorized representative who has indicated his/her understanding and acceptance.     Dental advisory given  Plan Discussed with: CRNA  Anesthesia Plan Comments:          Anesthesia Quick Evaluation

## 2023-12-10 ENCOUNTER — Inpatient Hospital Stay (HOSPITAL_COMMUNITY)

## 2023-12-10 ENCOUNTER — Inpatient Hospital Stay (HOSPITAL_COMMUNITY): Payer: Self-pay

## 2023-12-10 ENCOUNTER — Other Ambulatory Visit: Payer: Self-pay

## 2023-12-10 ENCOUNTER — Inpatient Hospital Stay (HOSPITAL_COMMUNITY)
Admission: RE | Admit: 2023-12-10 | Discharge: 2023-12-11 | DRG: 267 | Disposition: A | Discharge status: Home / Self Care | Attending: Cardiovascular Disease | Admitting: Cardiovascular Disease

## 2023-12-10 ENCOUNTER — Encounter (HOSPITAL_COMMUNITY): Admission: RE | Disposition: A | Payer: Self-pay | Source: Home / Self Care | Attending: Cardiovascular Disease

## 2023-12-10 ENCOUNTER — Encounter (HOSPITAL_COMMUNITY): Payer: Self-pay | Admitting: Cardiovascular Disease

## 2023-12-10 DIAGNOSIS — Z952 Presence of prosthetic heart valve: Principal | ICD-10-CM

## 2023-12-10 DIAGNOSIS — I482 Chronic atrial fibrillation, unspecified: Secondary | ICD-10-CM | POA: Diagnosis not present

## 2023-12-10 DIAGNOSIS — E785 Hyperlipidemia, unspecified: Secondary | ICD-10-CM | POA: Diagnosis present

## 2023-12-10 DIAGNOSIS — G4733 Obstructive sleep apnea (adult) (pediatric): Secondary | ICD-10-CM | POA: Diagnosis present

## 2023-12-10 DIAGNOSIS — Z9842 Cataract extraction status, left eye: Secondary | ICD-10-CM

## 2023-12-10 DIAGNOSIS — Z006 Encounter for examination for normal comparison and control in clinical research program: Secondary | ICD-10-CM | POA: Diagnosis not present

## 2023-12-10 DIAGNOSIS — I4819 Other persistent atrial fibrillation: Secondary | ICD-10-CM | POA: Diagnosis present

## 2023-12-10 DIAGNOSIS — Z7901 Long term (current) use of anticoagulants: Secondary | ICD-10-CM

## 2023-12-10 DIAGNOSIS — Z9081 Acquired absence of spleen: Secondary | ICD-10-CM

## 2023-12-10 DIAGNOSIS — Z79899 Other long term (current) drug therapy: Secondary | ICD-10-CM | POA: Diagnosis not present

## 2023-12-10 DIAGNOSIS — Z7984 Long term (current) use of oral hypoglycemic drugs: Secondary | ICD-10-CM | POA: Diagnosis not present

## 2023-12-10 DIAGNOSIS — Z83438 Family history of other disorder of lipoprotein metabolism and other lipidemia: Secondary | ICD-10-CM | POA: Diagnosis not present

## 2023-12-10 DIAGNOSIS — Z8616 Personal history of COVID-19: Secondary | ICD-10-CM

## 2023-12-10 DIAGNOSIS — I2510 Atherosclerotic heart disease of native coronary artery without angina pectoris: Secondary | ICD-10-CM | POA: Diagnosis present

## 2023-12-10 DIAGNOSIS — Z9841 Cataract extraction status, right eye: Secondary | ICD-10-CM

## 2023-12-10 DIAGNOSIS — I10 Essential (primary) hypertension: Secondary | ICD-10-CM | POA: Diagnosis present

## 2023-12-10 DIAGNOSIS — I350 Nonrheumatic aortic (valve) stenosis: Principal | ICD-10-CM | POA: Diagnosis present

## 2023-12-10 DIAGNOSIS — Z8249 Family history of ischemic heart disease and other diseases of the circulatory system: Secondary | ICD-10-CM | POA: Diagnosis not present

## 2023-12-10 DIAGNOSIS — E119 Type 2 diabetes mellitus without complications: Secondary | ICD-10-CM | POA: Diagnosis present

## 2023-12-10 DIAGNOSIS — Z860100 Personal history of colon polyps, unspecified: Secondary | ICD-10-CM

## 2023-12-10 HISTORY — DX: Nonrheumatic aortic (valve) stenosis: I35.0

## 2023-12-10 LAB — ECHOCARDIOGRAM LIMITED
AR max vel: 2.54 cm2
AV Area VTI: 2.75 cm2
AV Area mean vel: 2.69 cm2
AV Mean grad: 3.5 mmHg
AV Peak grad: 6 mmHg
Ao pk vel: 1.22 m/s

## 2023-12-10 LAB — POCT I-STAT, CHEM 8
BUN: 22 mg/dL (ref 8–23)
Calcium, Ion: 1.18 mmol/L (ref 1.15–1.40)
Chloride: 105 mmol/L (ref 98–111)
Creatinine, Ser: 0.8 mg/dL (ref 0.61–1.24)
Glucose, Bld: 210 mg/dL — ABNORMAL HIGH (ref 70–99)
HCT: 40 % (ref 39.0–52.0)
Hemoglobin: 13.6 g/dL (ref 13.0–17.0)
Potassium: 4.5 mmol/L (ref 3.5–5.1)
Sodium: 139 mmol/L (ref 135–145)
TCO2: 25 mmol/L (ref 22–32)

## 2023-12-10 LAB — GLUCOSE, CAPILLARY
Glucose-Capillary: 128 mg/dL — ABNORMAL HIGH (ref 70–99)
Glucose-Capillary: 170 mg/dL — ABNORMAL HIGH (ref 70–99)
Glucose-Capillary: 131 mg/dL — ABNORMAL HIGH (ref 70–99)
Glucose-Capillary: 122 mg/dL — ABNORMAL HIGH (ref 70–99)
Glucose-Capillary: 80 mg/dL (ref 70–99)

## 2023-12-10 LAB — URINALYSIS, ROUTINE W REFLEX MICROSCOPIC
Bilirubin Urine: NEGATIVE
Glucose, UA: NEGATIVE mg/dL
Hgb urine dipstick: NEGATIVE
Ketones, ur: NEGATIVE mg/dL
Leukocytes,Ua: NEGATIVE
Nitrite: NEGATIVE
Protein, ur: NEGATIVE mg/dL
Specific Gravity, Urine: 1.015 (ref 1.005–1.030)
pH: 5 (ref 5.0–8.0)

## 2023-12-10 LAB — ABO/RH: ABO/RH(D): A POS

## 2023-12-10 MED ORDER — INSULIN ASPART 100 UNIT/ML IJ SOLN: 0.0000 [IU] | INTRAMUSCULAR | Status: DC | PRN

## 2023-12-10 MED ORDER — PRAVASTATIN SODIUM 40 MG PO TABS
80.0000 mg | ORAL_TABLET | Freq: Every day | ORAL | Status: DC
  Administered 2023-12-10: 80 mg via ORAL
  Filled 2023-12-10 (×2): qty 2

## 2023-12-10 MED ORDER — CHLORHEXIDINE GLUCONATE 0.12 % MT SOLN
15.0000 mL | Freq: Once | OROMUCOSAL | Status: AC
  Administered 2023-12-10: 15 mL via OROMUCOSAL
  Filled 2023-12-10: qty 15

## 2023-12-10 MED ORDER — SODIUM CHLORIDE 0.9 % IV SOLN: INTRAVENOUS | Status: DC

## 2023-12-10 MED ORDER — CHLORHEXIDINE GLUCONATE 4 % EX SOLN: 60.0000 mL | Freq: Once | CUTANEOUS | Status: DC

## 2023-12-10 MED ORDER — LACTATED RINGERS IV SOLN: INTRAVENOUS | Status: DC | PRN

## 2023-12-10 MED ORDER — ONDANSETRON HCL 4 MG/2ML IJ SOLN: 4.0000 mg | Freq: Four times a day (QID) | INTRAMUSCULAR | Status: DC | PRN

## 2023-12-10 MED ORDER — SODIUM CHLORIDE 0.9 % IV SOLN
INTRAVENOUS | Status: DC | PRN
Start: 2023-12-10 — End: 2023-12-10

## 2023-12-10 MED ORDER — SODIUM CHLORIDE 0.9 % IV SOLN: 250.0000 mL | INTRAVENOUS | Status: DC | PRN

## 2023-12-10 MED ORDER — HEPARIN (PORCINE) IN NACL 1000-0.9 UT/500ML-% IV SOLN
INTRAVENOUS | Status: DC | PRN
  Administered 2023-12-10: 1500 mL via SURGICAL_CAVITY

## 2023-12-10 MED ORDER — OXYCODONE HCL 5 MG PO TABS: 5.0000 mg | ORAL_TABLET | ORAL | Status: DC | PRN

## 2023-12-10 MED ORDER — SODIUM CHLORIDE 0.9 % IV SOLN: INTRAVENOUS | Status: AC

## 2023-12-10 MED ORDER — FENTANYL CITRATE (PF) 100 MCG/2ML IJ SOLN
INTRAMUSCULAR | Status: AC
  Filled 2023-12-10: qty 2

## 2023-12-10 MED ORDER — CHLORHEXIDINE GLUCONATE 4 % EX SOLN: 30.0000 mL | CUTANEOUS | Status: DC

## 2023-12-10 MED ORDER — ONDANSETRON HCL 4 MG/2ML IJ SOLN
INTRAMUSCULAR | Status: DC | PRN
  Administered 2023-12-10: 4 mg via INTRAVENOUS

## 2023-12-10 MED ORDER — INSULIN ASPART 100 UNIT/ML IJ SOLN
0.0000 [IU] | Freq: Three times a day (TID) | INTRAMUSCULAR | Status: DC
  Administered 2023-12-10 – 2023-12-11 (×3): 2 [IU] via SUBCUTANEOUS
  Filled 2023-12-10 (×3): qty 2

## 2023-12-10 MED ORDER — MORPHINE SULFATE (PF) 2 MG/ML IV SOLN: 1.0000 mg | INTRAVENOUS | Status: DC | PRN

## 2023-12-10 MED ORDER — PROTAMINE SULFATE 10 MG/ML IV SOLN
INTRAVENOUS | Status: DC | PRN
  Administered 2023-12-10: 120 mg via INTRAVENOUS

## 2023-12-10 MED ORDER — TRAMADOL HCL 50 MG PO TABS: 50.0000 mg | ORAL_TABLET | ORAL | Status: DC | PRN

## 2023-12-10 MED ORDER — SODIUM CHLORIDE 0.9% FLUSH
3.0000 mL | Freq: Two times a day (BID) | INTRAVENOUS | Status: DC
  Administered 2023-12-10: 3 mL via INTRAVENOUS

## 2023-12-10 MED ORDER — FENTANYL CITRATE (PF) 100 MCG/2ML IJ SOLN
INTRAMUSCULAR | Status: DC | PRN
  Administered 2023-12-10 (×2): 25 ug via INTRAVENOUS

## 2023-12-10 MED ORDER — NITROGLYCERIN IN D5W 200-5 MCG/ML-% IV SOLN: 0.0000 ug/min | INTRAVENOUS | Status: DC

## 2023-12-10 MED ORDER — IODIXANOL 320 MG/ML IV SOLN
INTRAVENOUS | Status: DC | PRN
  Administered 2023-12-10: 40 mL via INTRA_ARTERIAL

## 2023-12-10 MED ORDER — DOXAZOSIN MESYLATE 4 MG PO TABS
4.0000 mg | ORAL_TABLET | Freq: Every day | ORAL | Status: DC
  Administered 2023-12-10 – 2023-12-11 (×2): 4 mg via ORAL
  Filled 2023-12-10 (×2): qty 1

## 2023-12-10 MED ORDER — CEFAZOLIN SODIUM-DEXTROSE 2-4 GM/100ML-% IV SOLN
2.0000 g | Freq: Three times a day (TID) | INTRAVENOUS | Status: AC
  Administered 2023-12-10 (×2): 2 g via INTRAVENOUS
  Filled 2023-12-10 (×2): qty 100

## 2023-12-10 MED ORDER — FINASTERIDE 5 MG PO TABS
5.0000 mg | ORAL_TABLET | Freq: Every day | ORAL | Status: DC
  Administered 2023-12-10 – 2023-12-11 (×2): 5 mg via ORAL
  Filled 2023-12-10 (×2): qty 1

## 2023-12-10 MED ORDER — LIDOCAINE HCL (PF) 1 % IJ SOLN
INTRAMUSCULAR | Status: DC | PRN
  Administered 2023-12-10: 20 mL

## 2023-12-10 MED ORDER — HEPARIN SODIUM (PORCINE) 1000 UNIT/ML IJ SOLN
INTRAMUSCULAR | Status: DC | PRN
  Administered 2023-12-10: 12000 [IU] via INTRAVENOUS

## 2023-12-10 MED ORDER — NOREPINEPHRINE 4 MG/250ML-% IV SOLN
0.0000 ug/min | INTRAVENOUS | Status: DC
  Filled 2023-12-10: qty 250

## 2023-12-10 MED ORDER — SODIUM CHLORIDE 0.9% FLUSH: 3.0000 mL | INTRAVENOUS | Status: DC | PRN

## 2023-12-10 MED ORDER — ACETAMINOPHEN 650 MG RE SUPP: 650.0000 mg | Freq: Four times a day (QID) | RECTAL | Status: DC | PRN

## 2023-12-10 MED ORDER — ACETAMINOPHEN 325 MG PO TABS
650.0000 mg | ORAL_TABLET | Freq: Four times a day (QID) | ORAL | Status: DC | PRN
  Administered 2023-12-11: 650 mg via ORAL
  Filled 2023-12-10: qty 2

## 2023-12-10 SURGICAL SUPPLY — 30 items
BAG SNAP BAND KOVER 36X36 (MISCELLANEOUS) ×2 IMPLANT
CABLE ADAPT PACING TEMP 12FT (ADAPTER) IMPLANT
CATH 26 ULTRA DELIVERY (CATHETERS) IMPLANT
CATH DIAG 6FR PIGTAIL ANGLED (CATHETERS) IMPLANT
CATH INFINITI 5FR ANG PIGTAIL (CATHETERS) IMPLANT
CATH INFINITI 6F AL2 (CATHETERS) IMPLANT
CATH S G BIP PACING (CATHETERS) IMPLANT
CLOSURE MYNX CONTROL 6F/7F (Vascular Products) IMPLANT
CLOSURE PERCLOSE PROSTYLE (Vascular Products) IMPLANT
CRIMPER (MISCELLANEOUS) IMPLANT
DEVICE INFLATION ATRION QL2530 (MISCELLANEOUS) IMPLANT
KIT MICROPUNCTURE NIT STIFF (SHEATH) IMPLANT
KIT SAPIAN 3 ULTRA RESILIA 26 (Valve) IMPLANT
KIT SYRINGE INJ CVI SPIKEX1 (MISCELLANEOUS) IMPLANT
PACK CARDIAC CATHETERIZATION (CUSTOM PROCEDURE TRAY) ×1 IMPLANT
SET ATX-X65L (MISCELLANEOUS) IMPLANT
SHEATH BRITE TIP 7FR 35CM (SHEATH) IMPLANT
SHEATH INTRODUCER SET 20-26 (SHEATH) IMPLANT
SHEATH PINNACLE 6F 10CM (SHEATH) IMPLANT
SHEATH PINNACLE 8F 10CM (SHEATH) IMPLANT
SHIELD CATH-GARD CONTAMINATION (MISCELLANEOUS) IMPLANT
STOPCOCK MORSE 400PSI 3WAY (MISCELLANEOUS) ×2 IMPLANT
TRANSDUCER W/STOPCOCK (MISCELLANEOUS) IMPLANT
TUBING ART PRESS 72 MALE/FEM (TUBING) IMPLANT
WIRE AMPLATZ SS-J .035X180CM (WIRE) IMPLANT
WIRE EMERALD 3MM-J .035X150CM (WIRE) IMPLANT
WIRE EMERALD 3MM-J .035X260CM (WIRE) IMPLANT
WIRE EMERALD ST .035X260CM (WIRE) IMPLANT
WIRE MICROINTRODUCER 60CM (WIRE) IMPLANT
WIRE SAFARI SM CURVE 275 (WIRE) IMPLANT

## 2023-12-10 NOTE — Discharge Instructions (Signed)

## 2023-12-10 NOTE — Progress Notes (Signed)
  [facility]   MULTIDISCIPLINARY HEART VALVE TEAM  Patient doing well s/p TAVR. He is hemodynamically stable. Groin sites stable. ECG with afib with slow VR but no high grade block. Plan to transfer to from cath lab holding to 4E when bed available. Early ambulation after bedrest completed and hopeful discharge over the next 24-48 hours.   Lamarr Hummer PA-C  [facility]  Pager [phone]

## 2023-12-10 NOTE — Progress Notes (Signed)
 Site area: left groin 53F venous sheath Site Prior to Removal:  Level 0 Pressure Applied For: 15 minutes Manual:   yes Patient Status During Pull:  stable Post Pull Site:  Level 0 Post Pull Instructions Given:  yes Post Pull Pulses Present: left dp palpable Dressing Applied:  gauze and tegaderm Bedrest begins @ 0940 Comments:

## 2023-12-10 NOTE — Transfer of Care (Signed)
 Immediate Anesthesia Transfer of Care Note  Patient: William Preston  Procedure(s) Performed: Transcatheter Aortic Valve Replacement, Transfemoral ECHOCARDIOGRAM, TRANSTHORACIC  Patient Location: PACU and Cath Lab  Anesthesia Type:MAC  Level of Consciousness: awake and alert   Airway & Oxygen Therapy: Patient Spontanous Breathing and Patient connected to face mask oxygen  Post-op Assessment: Report given to RN and Post -op Vital signs reviewed and stable  Post vital signs: Reviewed and stable  Last Vitals:  Vitals Value Taken Time  BP 124/75 12/10/23 09:07  Temp    Pulse 58 12/10/23 09:09  Resp 15 12/10/23 09:09  SpO2 99 % 12/10/23 09:09  Vitals shown include unfiled device data.  Last Pain:  Vitals:   12/10/23 0900  TempSrc:   PainSc: Asleep         Complications: There were no known notable events for this encounter.

## 2023-12-10 NOTE — Anesthesia Postprocedure Evaluation (Signed)
 Anesthesia Post Note  Patient: William Preston  Procedure(s) Performed: Transcatheter Aortic Valve Replacement, Transfemoral ECHOCARDIOGRAM, TRANSTHORACIC     Patient location during evaluation: PACU Anesthesia Type: MAC Level of consciousness: awake and alert Pain management: pain level controlled Vital Signs Assessment: post-procedure vital signs reviewed and stable Respiratory status: spontaneous breathing, nonlabored ventilation, respiratory function stable and patient connected to nasal cannula oxygen Cardiovascular status: stable and blood pressure returned to baseline Postop Assessment: no apparent nausea or vomiting Anesthetic complications: no   There were no known notable events for this encounter.  Last Vitals:  Vitals:   12/10/23 1400 12/10/23 1500  BP: 121/75 128/70  Pulse: 74 87  Resp: 17 14  Temp:  36.6 C  SpO2: 97% 97%    Last Pain:  Vitals:   12/10/23 1500  TempSrc: Oral  PainSc:                  Thom JONELLE Peoples

## 2023-12-10 NOTE — Progress Notes (Signed)
 Belongings sent with pt to procedure. Sotero Dadds, CRNA stated pt recovers in cath lab recovery.  Joesph LOISE Stake, RN

## 2023-12-10 NOTE — Discharge Summary (Incomplete)
 [facility]   MULTIDISCIPLINARY HEART VALVE TEAM  Discharge Summary    Patient ID: [name] MRN: [id number]; DOB: 1950-04-20  Admit date: 12/10/2023 Discharge date: 12/11/2023  PCP:  Marchelle Clem Pitts, MD  [facility] Cardiologist:  Dorn Lesches, MD  [facility] Structural heart: Lonni Cash, MD [facility] Electrophysiologist:  None   Discharge Diagnoses    Principal Problem:   S/P TAVR (transcatheter aortic valve replacement) Active Problems:   Obstructive sleep apnea   Essential hypertension, benign   Chronic atrial fibrillation (HCC)   Dyslipidemia   Type II diabetes mellitus (HCC)   Severe aortic stenosis   Allergies No Known Allergies  Diagnostic Studies/Procedures    [facility]  TAVR OPERATIVE NOTE     Date of Procedure:                12/10/2023   Preoperative Diagnosis:      Severe Aortic Stenosis    Postoperative Diagnosis:    Same    Procedure:        Transcatheter Aortic Valve Replacement - Transfemoral Approach             Edwards Sapien 3 Ultra Resilia THV (size 26 mm, model # [id number], serial # [id number] )              Co-Surgeons:                        Lonni Cash, MD and Linnie Rayas , MD    Anesthesiologist:                  Erma   Echocardiographer:              O'Neal   Pre-operative Echo Findings: Severe aortic stenosis Normal left ventricular systolic function   Post-operative Echo Findings: No paravalvular leak Normal left ventricular systolic function _____________    Echo 12/11/23: completed but pending formal read at the time of discharge   History of Present Illness     William Preston is a 73 y.o. male with a history of persistent atrial fibrillation on Eliquis , HLD, HTN, DMT2, sleep apnea on CPAP and severe aortic stenosis who presented to [facility] on 12/10/23 for planned TAVR.   He has been followed by Dr. Lesches for aortic stenosis. Echo 07/16/23 with  EF 60%, severe AS with mean grad 30 mmHg, Vmax 3.79 m/s, AVA 0.74 cm2, DVI 0.21, SVI 29, mild-mod MR & TR. Cardiac cath 09/30/23 with moderate non-obstructive CAD with plans for medical management of his CAD. He complained of progressive dyspnea on exertion, fatigue and LE edema.  The patient was evaluated by the multidisciplinary valve team and felt to have severe, symptomatic aortic stenosis and to be a suitable candidate for TAVR, which was set up for 12/10/23.   Hospital Course     Consultants: none   Severe AS:  -- S/p successful TAVR with a 26 mm Edwards Sapien 3 Ultra Resilia THV via the TF approach on 12/10/23.  -- Post operative echo completed but pending formal read. -- Groin sites are stable.  -- ECG with no high grade heart block. -- Continue Eliquis  5mg  BID. -- Met with cardiac rehab to discuss CRP phase II.  -- Plan for discharge home today with close follow up in the outpatient setting.   HTN: -- BP elevated in the setting of holding home meds.  -- Resume losartan  50mg  daily, lopressor  25mg   daily, and Cardizem  CD 240mg  daily.   HLD: -- Continue pravastatin  80mg  daily.   DMT2:  -- Treated with SSI while admitted.  -- Resume home meds at discharge.  -- Okay to resume Metformin after 48 hours after contrast dye exposure (12/27 PM).  Persistent atrial fibrillation: -- He was initally bradycardic and all AV nodals were held. He then developed RVR and resumed on home digoxin  0.125mg  daily, metoprolol  25mg  daily and Cardizem  CD 240mg  daily.  -- Given one dose of IV lopressor  this AM to obtain echo.  -- Resume home Eliquis  5mg  BID.   Peritoneal nodules: -- Noted on pre TAVR CT and referred to oncology rapid diagnostic clinic. 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 No further workup is required at this time.  -- They plan 6 month CT abdomen/pelvis for follow up.   Pulmonary nodules: -- Measuring 7 mm  noted on pre TAVR CTs.  -- Non-contrast chest CT at 3-6 months is recommended. If the nodules are stable at time of repeat CT, then future CT at 18-24 months (from today's scan) is considered optional for low-risk patients, but is recommended for high-risk patients.  -- This will be discussed in the outpatient setting.   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  -- Elquis only held 48 hours prior to TAVR.  -- Resume Eliquis  today.  _____________  Discharge Vitals Blood pressure 121/78, pulse (!) 107, temperature 98.7 F (37.1 C), temperature source Oral, resp. rate 20, height 5' 9 (1.753 m), weight 84.5 kg, SpO2 94%.  Filed Weights   12/09/23 1200 12/10/23 0604  Weight: 84.5 kg 84.5 kg     GEN: Well nourished, well developed in no acute distress NECK: No JVD CARDIAC: irreg irreg, no murmurs, rubs, gallops RESPIRATORY:  Clear to auscultation without rales, wheezing or rhonchi  ABDOMEN: Soft, non-tender, non-distended EXTREMITIES:  No edema; No deformity.  Groin sites clear without hematoma or ecchymosis.   Disposition   Pt is being discharged home today in good condition.  Follow-up Plans & Appointments     Follow-up Information     Sebastian Lamarr SAUNDERS, PA-C. Go on 12/16/2023.   Specialties: Cardiology, Radiology Why: @ 9:55am, please arrive 20 minutes early. Contact information: [address] [place] [address] [phone]                  Discharge Medications   Allergies as of 12/11/2023   No Known Allergies      Medication List     TAKE these medications    apixaban  5 MG Tabs tablet Commonly known as: Eliquis  Take 1 tablet (5 mg total) by mouth 2 (two) times daily.   cholecalciferol 1000 units tablet Commonly known as: VITAMIN D Take 1,000 Units by mouth daily.   cyanocobalamin 1000 MCG tablet Commonly known as: VITAMIN B12 Take 1,000 mcg by mouth daily.   digoxin  0.25 MG tablet Commonly known as: LANOXIN  Take  1/2 (one-half) tablet by mouth once daily   diltiazem  240 MG 24 hr capsule Commonly known as: CARDIZEM  CD Take 1 capsule (240 mg total) by mouth daily.   doxazosin  4 MG tablet Commonly known as: CARDURA  Take 4 mg by mouth daily.   finasteride  5 MG tablet Commonly known as: PROSCAR  Take 5 mg by mouth daily.   furosemide  40 MG tablet Commonly known as: LASIX  Take 1 tablet (40 mg total) by mouth daily. What changed:  when to  take this reasons to take this   glyBURIDE 2.5 MG tablet Commonly known as: DIABETA Take 2.5 mg by mouth 3 (three) times daily.   losartan  50 MG tablet Commonly known as: COZAAR  Take 1 tablet (50 mg total) by mouth daily.   MAGNESIUM  GLYCINATE PO Take 500 mg by mouth daily.   metFORMIN 500 MG tablet Commonly known as: GLUCOPHAGE Take 500 mg by mouth 3 (three) times daily. Notes to patient: Metformin after 48 hours after contrast dye exposure (12/27 PM).   metoprolol  tartrate 25 MG tablet Commonly known as: LOPRESSOR  Take 1 tablet (25 mg total) by mouth daily.   NON FORMULARY Pt uses a cpap nightly   pravastatin  80 MG tablet Commonly known as: PRAVACHOL  Take 1 tablet (80 mg total) by mouth daily.   PRESERVISION AREDS 2 PO Take 1 capsule by mouth 2 (two) times daily.   multivitamin with minerals tablet Take 1 tablet by mouth daily.   vitamin C 1000 MG tablet Take 1,000 mg by mouth daily.   vitamin E 180 MG (400 UNITS) capsule Take 400 Units by mouth daily.   Voltaren Arthritis Pain 1 % Gel Generic drug: diclofenac Sodium Apply 2 g topically daily as needed (Knee pain).            Outstanding Labs/Studies   none  ______________________  Duration of Discharge Encounter: APP Time: 15 minutes    Signed, Lamarr Hummer, PA-C 12/11/2023, 9:23 AM [phone]  [name] was seen by me today along with Lamarr Hummer RIGGERS. I have personally performed an evaluation on this patient.  My findings are as  follows:  73 y.o. male with severe AS, now s/p TAVR. He is doing well. Atrial fib is persistent. Rates elevated today after holding AV nodal blocking agents post TAVR. No complaints.   Data: EKG(s) and pertinent labs, studies, etc were personally reviewed and interpreted by me:  EKG reviewed by me: atrial fib Telemetry reviewed by me: atrial fib, rates 120s All labs reviewed by me Otherwise, I agree with data as outlined by the advanced practice provider.  Exam performed by me: Gen: NAD Neck: No JVD Cardiac: irreg without murmur Lungs: clear bilaterally Extremities: No LE edema  My Assessment and Plan:  Severe AS: now one day post TAVR, TF approach. Sapien valve. Rates are elevated today after holding home medications. Resume home meds after one dose of IV Lopressor . Discharge home today.   The patient will be discharged home today  I spent 20 minutes seeing the patient. During that time, I reviewed the labs, EKG, telemetry,  echo images, evaluated their symptoms and performed an examination. This time also included plan formulation, discussion of the plan with the patient and the time spent with documentation.   Signed,  Lonni Cash, MD  12/11/2023 12:15 PM

## 2023-12-10 NOTE — Anesthesia Procedure Notes (Signed)
 Procedure Name: MAC Date/Time: 12/10/2023 7:34 AM  Performed by: Boyce Shilling, CRNAPre-anesthesia Checklist: Patient identified, Emergency Drugs available, Suction available, Timeout performed and Patient being monitored Patient Re-evaluated:Patient Re-evaluated prior to induction Oxygen Delivery Method: Simple face mask Induction Type: IV induction Dental Injury: Teeth and Oropharynx as per pre-operative assessment

## 2023-12-10 NOTE — Progress Notes (Signed)
 Patient received from Cath lab, V/S obtained, CCMD notified, CHG bath given, B/L groins CD/I, patient will be on bedrest till 1340, all needs met, call bell in reach, POC continues.    12/10/23 1128  Vitals  Temp 97.6 F (36.4 C)  Temp Source Oral  BP 121/82  MAP (mmHg) 93  BP Location Right Arm  BP Method Automatic  Patient Position (if appropriate) Lying  Pulse Rate 86  Pulse Rate Source Monitor  ECG Heart Rate 62  Resp 16  MEWS COLOR  MEWS Score Color Green  Oxygen Therapy  SpO2 95 %  Pain Assessment  Pain Scale 0-10  Pain Score 0  MEWS Score  MEWS Temp 0  MEWS Systolic 0  MEWS Pulse 0  MEWS RR 0  MEWS LOC 1  MEWS Score 1

## 2023-12-10 NOTE — Op Note (Signed)
 Signed       [facility]   MULTIDISCIPLINARY HEART VALVE TEAM     TAVR OPERATIVE NOTE     Date of Procedure:                12/10/2023   Preoperative Diagnosis:      Severe Aortic Stenosis    Postoperative Diagnosis:    Same    Procedure:        Transcatheter Aortic Valve Replacement - Percutaneous Transfemoral Approach             Edwards Sapien 3 Ultra Resilia THV nominal prep (size 26 mm, model # [id number], serial # [id number] )               Co-Surgeons:                        Linnie Rayas, MD and Lonni Cash, MD   Anesthesiologist:                  General   Echocardiographer:              Toney Decent, MD   Pre-operative Echo Findings: Severe aortic stenosis normal left ventricular systolic function    Post-operative Echo Findings: no paravalvular leak normal left ventricular systolic function      BRIEF CLINICAL NOTE AND INDICATIONS FOR SURGERY   73 y.o. male with severe aortic stenosis.  STS score: 0.886.  NYHA Class II.  The risks and benefits of transfermoral TAVR were discussed in detail.  We also discussed possibility of an emergent sternotomy to address any procedural complications.  Based on our discussion, we collectively decided that an emergent sternotomy would be indicated.  The patient is agreeable to proceed.  Based on my review of her LHC, echo, and CTA, I agree with the multidisciplinary plan to proceed with a 26mm S3 Transfemoral TAVR.  He will need to hold his eliquis  for 2 day.        DETAILS OF THE OPERATIVE PROCEDURE   PREPARATION:     The patient was brought to the operating room on the above mentioned date and appropriate monitoring was established by the anesthesia team. The patient was placed in the supine position on the operating table.  Intravenous antibiotics were administered. The patient was monitored closely throughout the procedure under conscious sedation.   Baseline transthoracic echocardiogram was  performed. The patient's abdomen and both groins were prepped and draped in a sterile manner. A time out procedure was performed.     PERIPHERAL ACCESS:     Using the modified Seldinger technique, femoral arterial was obtained with placement of 6 Fr sheath on the left side.  A pigtail diagnostic catheter was passed through the arterial sheath under fluoroscopic guidance into the aortic root. Venous access was from the femoral vein.  A temporary transvenous pacemaker catheter was passed through the venous sheath under fluoroscopic guidance into the right ventricle.  The pacemaker was tested to ensure stable lead placement and pacemaker capture. Aortic root angiography was performed in order to determine the optimal angiographic angle for valve deployment.     TRANSFEMORAL ACCESS:    Percutaneous transfemoral access and sheath placement was performed using ultrasound guidance.  The right common femoral artery was cannulated using a micropuncture needle and appropriate location was verified using hand injection angiogram.  A pair of Abbott Perclose percutaneous closure devices were placed and a 6 French sheath replaced into  the femoral artery.  The patient was heparinized systemically and ACT verified > 250 seconds.     A 14 Fr transfemoral E-sheath was introduced into the right common femoral artery after progressively dilating over an Amplatz superstiff wire. An pigtail catheter was used to direct a straight-tip exchange length wire across the native aortic valve into the left ventricle. This was exchanged out for a pigtail catheter and position was confirmed in the LV apex. Simultaneous LV and Ao pressures were recorded.  The pigtail catheter was exchanged for a Safari wire in the LV apex.         TRANSCATHETER HEART VALVE DEPLOYMENT:    An Edwards S3 [facility]  transcatheter heart valve (size 26 mm) was prepared and crimped per manufacturer's guidelines, and the proper orientation of the valve is  confirmed on the [employer] delivery system. The valve was advanced through the introducer sheath using normal technique until in an appropriate position in the abdominal aorta beyond the sheath tip. The balloon was then retracted and using the fine-tuning wheel was centered on the valve. The valve was then advanced across the aortic arch using appropriate flexion of the catheter. The valve was carefully positioned across the aortic valve annulus. The Commander catheter was retracted using normal technique. Once final position of the valve has been confirmed by angiographic assessment, the valve is deployed during rapid ventricular pacing to maintain systolic blood pressure < 50 mmHg and pulse pressure < 10 mmHg. The balloon inflation is held for >3 seconds after reaching full deployment volume. Once the balloon has fully deflated the balloon is retracted into the ascending aorta and valve function is assessed using echocardiography. There is felt to be trace paravalvular leak and no central aortic insufficiency.  The patient's hemodynamic recovery following valve deployment is good.  The deployment balloon and guidewire are both removed.      PROCEDURE COMPLETION:    The sheath was removed and femoral artery closure performed.  Protamine  was administered once femoral arterial repair was complete. The temporary pacemaker, pigtail catheter and femoral sheaths were removed with manual pressure used for venous hemostasis.  A Mynx femoral closure device was utilized following removal of the diagnostic sheath in the right femoral artery.   The patient tolerated the procedure well and is transported to the cath lab recovery area in stable condition. There were no immediate intraoperative complications. All sponge instrument and needle counts are verified correct at completion of the operation.    No blood products were administered during the operation.   The patient received a total of 40 mL of  intravenous contrast during the procedure.

## 2023-12-10 NOTE — CV Procedure (Signed)
 HEART AND VASCULAR CENTER  TAVR OPERATIVE NOTE   Date of Procedure:  12/10/2023  Preoperative Diagnosis: Severe Aortic Stenosis   Postoperative Diagnosis: Same   Procedure:   Transcatheter Aortic Valve Replacement - Transfemoral Approach  Edwards Sapien 3 Ultra Resilia THV (size 26 mm, model # [id number], serial # [id number] )   Co-Surgeons:  Lonni Cash, MD and Linnie Rayas , MD   Anesthesiologist:  Erma  Echocardiographer:  O'Neal  Pre-operative Echo Findings: Severe aortic stenosis Normal left ventricular systolic function  Post-operative Echo Findings: No paravalvular leak Normal left ventricular systolic function  BRIEF CLINICAL NOTE AND INDICATIONS FOR SURGERY  73 yo male with history of persistent atrial fibrillation, HLD, HTN, DM, sleep apnea and aortic stenosis. He has persistent atrial fibrillation and is on Eliquis . He has been followed by Dr. Court for moderate aortic stenosis. Echo 07/16/23 with LVEF=60-65%. Mild to moderate mitral regurgitation. Severe aortic stenosis with mean gradient 30 mmHg, peak gradient 57.7 mmHg, AVA 0.67 cm2, SVI 29, DI 0.21. Cardiac cath 09/30/23 with moderate non-obstructive CAD with plans for medical management of his CAD.   During the course of the patient's preoperative work up they have been evaluated comprehensively by a multidisciplinary team of specialists coordinated through the Multidisciplinary Heart Valve Clinic in the [facility].  They have been demonstrated to suffer from symptomatic severe aortic stenosis as noted above. The patient has been counseled extensively as to the relative risks and benefits of all options for the treatment of severe aortic stenosis including long term medical therapy, conventional surgery for aortic valve replacement, and transcatheter aortic valve replacement.  The patient has been independently evaluated by Dr. Rayas with CT surgery and they are felt to be at  high risk for conventional surgical aortic valve replacement. The surgeon indicated the patient would be a poor candidate for conventional surgery. Based upon review of all of the patient's preoperative diagnostic tests they are felt to be candidate for transcatheter aortic valve replacement using the transfemoral approach as an alternative to high risk conventional surgery.    Following the decision to proceed with transcatheter aortic valve replacement, a discussion has been held regarding what types of management strategies would be attempted intraoperatively in the event of life-threatening complications, including whether or not the patient would be considered a candidate for the use of cardiopulmonary bypass and/or conversion to open sternotomy for attempted surgical intervention.  The patient has been advised of a variety of complications that might develop peculiar to this approach including but not limited to risks of death, stroke, paravalvular leak, aortic dissection or other major vascular complications, aortic annulus rupture, device embolization, cardiac rupture or perforation, acute myocardial infarction, arrhythmia, heart block or bradycardia requiring permanent pacemaker placement, congestive heart failure, respiratory failure, renal failure, pneumonia, infection, other late complications related to structural valve deterioration or migration, or other complications that might ultimately cause a temporary or permanent loss of functional independence or other long term morbidity.  The patient provides full informed consent for the procedure as described and all questions were answered preoperatively.    DETAILS OF THE OPERATIVE PROCEDURE  PREPARATION:   The patient is brought to the operating room on the above mentioned date and central monitoring was established by the anesthesia team including placement of a radial arterial line. The patient is placed in the supine position on the  operating table.  Intravenous antibiotics are administered. Conscious sedation is used.   Baseline transthoracic echocardiogram was performed.  The patient's chest, abdomen, both groins, and both lower extremities are prepared and draped in a sterile manner. A time out procedure is performed.   PERIPHERAL ACCESS:   Using the modified Seldinger technique, femoral arterial and venous access were obtained with placement of a 6 Fr sheath in the artery and a 7 Fr sheath in the vein on the left side using u/s guidance.  A pigtail diagnostic catheter was passed through the femoral arterial sheath under fluoroscopic guidance into the aortic root.  A temporary transvenous pacemaker catheter was passed through the femoral venous sheath under fluoroscopic guidance into the right ventricle.  The pacemaker was tested to ensure stable lead placement and pacemaker capture. Aortic root angiography was performed in order to determine the optimal angiographic angle for valve deployment.  TRANSFEMORAL ACCESS:  A micropuncture kit was used to gain access to the right femoral artery using u/s guidance. Position confirmed with angiography. Pre-closure with double ProGlide closure devices. The patient was heparinized systemically and ACT verified > 250 seconds.    A 14 Fr transfemoral E-sheath was introduced into the right femoral artery after progressively dilating over an Amplatz superstiff wire. An AL-2 catheter was used to direct a long J wire across the native aortic valve into the left ventricle. This was exchanged out for a pigtail catheter and position was confirmed in the LV apex. Simultaneous LV and Ao pressures were recorded.  The pigtail catheter was then exchanged for a Safari wire in the LV apex.   TRANSCATHETER HEART VALVE DEPLOYMENT:  An Edwards Sapien 3 THV (size 26 mm) was prepared and crimped per manufacturer's guidelines, and the proper orientation of the valve is confirmed on the [employer]  delivery system. The valve was advanced through the introducer sheath using normal technique until in an appropriate position in the abdominal aorta beyond the sheath tip. The balloon was then retracted and using the fine-tuning wheel was centered on the valve. The valve was then advanced across the aortic arch using appropriate flexion of the catheter. The valve was carefully positioned across the aortic valve annulus. The Commander catheter was retracted using normal technique. Once final position of the valve has been confirmed by angiographic assessment, the valve is deployed while temporarily holding ventilation and during rapid ventricular pacing to maintain systolic blood pressure < 50 mmHg and pulse pressure < 10 mmHg. The balloon inflation is held for >3 seconds after reaching full deployment volume. Once the balloon has fully deflated the balloon is retracted into the ascending aorta and valve function is assessed using TTE. There is felt to be no paravalvular leak and no central aortic insufficiency.  The patient's hemodynamic recovery following valve deployment is good.  The deployment balloon and guidewire are both removed. Echo demostrated acceptable post-procedural gradients, stable mitral valve function, and no AI.   PROCEDURE COMPLETION:  The sheath was then removed and closure devices were completed. Protamine  was administered once femoral arterial repair was complete. The temporary pacemaker, pigtail catheters and femoral sheaths were removed with  a Mynx closure device placed in the artery and manual pressure used for venous hemostasis.    The patient tolerated the procedure well and is transported to the surgical intensive care in stable condition. There were no immediate intraoperative complications. All sponge instrument and needle counts are verified correct at completion of the operation.   No blood products were administered during the operation.  The patient received a total of 40  mL of intravenous contrast during the  procedure.  LVEDP: 15 mmHg  Lonni Cash MD, [facility] 12/10/2023 9:11 AM

## 2023-12-10 NOTE — Interval H&P Note (Signed)
 History and Physical Interval Note:  12/10/2023 7:17 AM  William Preston  has presented today for surgery, with the diagnosis of Severe Aortic Stenosis.  The various methods of treatment have been discussed with the patient and family. After consideration of risks, benefits and other options for treatment, the patient has consented to  Procedure(s): Transcatheter Aortic Valve Replacement, Transfemoral (N/A) ECHOCARDIOGRAM, TRANSTHORACIC (N/A) as a surgical intervention.  The patient's history has been reviewed, patient examined, no change in status, stable for surgery.  I have reviewed the patient's chart and labs.  Questions were answered to the patient's satisfaction.     William Preston

## 2023-12-10 NOTE — Plan of Care (Signed)

## 2023-12-11 ENCOUNTER — Encounter (HOSPITAL_COMMUNITY): Payer: Self-pay | Admitting: Cardiovascular Disease

## 2023-12-11 ENCOUNTER — Inpatient Hospital Stay (HOSPITAL_COMMUNITY)

## 2023-12-11 DIAGNOSIS — I4819 Other persistent atrial fibrillation: Secondary | ICD-10-CM | POA: Diagnosis not present

## 2023-12-11 DIAGNOSIS — Z952 Presence of prosthetic heart valve: Secondary | ICD-10-CM

## 2023-12-11 LAB — ECHOCARDIOGRAM COMPLETE
AR max vel: 2.23 cm2
AV Area VTI: 2.23 cm2
AV Area mean vel: 2.27 cm2
AV Mean grad: 5.3 mmHg
AV Peak grad: 9.6 mmHg
Ao pk vel: 1.55 m/s
Area-P 1/2: 6.02 cm2
Height: 69 in
MV VTI: 2.08 cm2
S' Lateral: 3.1 cm
Weight: 2982.38 [oz_av]

## 2023-12-11 LAB — CBC
HCT: 41.5 % (ref 39.0–52.0)
Hemoglobin: 13.9 g/dL (ref 13.0–17.0)
MCH: 29.1 pg (ref 26.0–34.0)
MCHC: 33.5 g/dL (ref 30.0–36.0)
MCV: 86.8 fL (ref 80.0–100.0)
Platelets: 158 K/uL (ref 150–400)
RBC: 4.78 MIL/uL (ref 4.22–5.81)
RDW: 14.6 % (ref 11.5–15.5)
WBC: 9.1 K/uL (ref 4.0–10.5)
nRBC: 0 % (ref 0.0–0.2)

## 2023-12-11 LAB — BASIC METABOLIC PANEL WITH GFR
Anion gap: 12 (ref 5–15)
BUN: 15 mg/dL (ref 8–23)
CO2: 22 mmol/L (ref 22–32)
Calcium: 8.9 mg/dL (ref 8.9–10.3)
Chloride: 104 mmol/L (ref 98–111)
Creatinine, Ser: 0.92 mg/dL (ref 0.61–1.24)
GFR, Estimated: >60 mL/min (ref >60)
Glucose, Bld: 116 mg/dL — ABNORMAL HIGH (ref 70–99)
Potassium: 3.7 mmol/L (ref 3.5–5.1)
Sodium: 138 mmol/L (ref 135–145)

## 2023-12-11 LAB — GLUCOSE, CAPILLARY: Glucose-Capillary: 141 mg/dL — ABNORMAL HIGH (ref 70–99)

## 2023-12-11 LAB — MAGNESIUM: Magnesium: 1.8 mg/dL (ref 1.7–2.4)

## 2023-12-11 MED ORDER — METOPROLOL TARTRATE 5 MG/5ML IV SOLN
5.0000 mg | Freq: Once | INTRAVENOUS | Status: AC
Start: 2023-12-11 — End: 2023-12-11

## 2023-12-11 MED ORDER — METOPROLOL TARTRATE 25 MG PO TABS
25.0000 mg | ORAL_TABLET | Freq: Every day | ORAL | Status: DC
  Administered 2023-12-11: 25 mg via ORAL
  Filled 2023-12-11: qty 1

## 2023-12-11 MED ORDER — METOPROLOL TARTRATE 5 MG/5ML IV SOLN
INTRAVENOUS | Status: AC
  Filled 2023-12-11: qty 5

## 2023-12-11 MED ORDER — DILTIAZEM HCL ER COATED BEADS 120 MG PO CP24
240.0000 mg | ORAL_CAPSULE | Freq: Every day | ORAL | Status: DC
  Administered 2023-12-11: 240 mg via ORAL
  Filled 2023-12-11: qty 2

## 2023-12-11 MED ORDER — DIGOXIN 125 MCG PO TABS
0.1250 mg | ORAL_TABLET | Freq: Every day | ORAL | Status: DC
  Administered 2023-12-11: 0.125 mg via ORAL
  Filled 2023-12-11: qty 1

## 2023-12-11 NOTE — Progress Notes (Signed)
 Reviewed AVS, patient expressed understanding of medications, MD follow up reviewed.   Removed IV, Site clean, dry and intact.  CCMD contacted and informed patients is being discharged.  Patient states all belongings brought to the hospital at time of admission are accounted for and packed to take home.  [facility] contacted to transport patient to entrance A,  where family member was waiting in vehicle to transport home.

## 2023-12-11 NOTE — Progress Notes (Signed)
  Echocardiogram 2D Echocardiogram has been performed.  Charmaine KANDICE Gaskins 12/11/2023, 10:37 AM

## 2023-12-11 NOTE — Progress Notes (Signed)
   12/11/23 1237  TOC Brief Assessment  Insurance and Status Reviewed  Patient has primary care physician Yes  Home environment has been reviewed home w/ spouse  Prior level of function: independent  Prior/Current Home Services No current home services  Social Drivers of Health Review SDOH reviewed no interventions necessary  Readmission risk has been reviewed Yes  Transition of care needs no transition of care needs at this time    Pt s/p TAVR, stable for transition home today, family to transport home, no HH or DME needs noted.

## 2023-12-11 NOTE — Progress Notes (Signed)
 At approximately 12:55am Patient was noted to have HR in 180s and 190s on telemetry monitor. Upon entering the room, pt was observed sitting on the side of the bed using the urinal with assistance from his wife. I asked the patient could he feel his heart racing and he said no, he felt fine, he just need to urinate and decided to sit on the side of the bed.  This RN contacted CCMD, noting we received no alarms for the same. CCMD says the reading was not real his true HR did not exceed 140 and this is not new for the patient. The patient is in afib, this RN observed the heart monitor return to low 100s shortly after the patient laid down.  Primofit was placed on the patient so he will not need to use the urinal for the remainder of the night. Will continue to monitor closely.

## 2023-12-11 NOTE — Progress Notes (Signed)
 Discussed with pt restrictions, exercise guidelines, and CRPII. Pt receptive. Will refer to [facility].  [id number] Aliene Aris BS, ACSM-CEP 12/11/2023 10:22 AM

## 2023-12-11 NOTE — Progress Notes (Signed)
 Received a follow-up call from CCMD who now advises the previous HR that was thought to be false was in fact a true event. On-call physician notified and made aware. No new orders at this time will continue to monitor.

## 2023-12-12 NOTE — Progress Notes (Signed)
 HEART AND VASCULAR CENTER   MULTIDISCIPLINARY HEART VALVE CLINIC                                     Cardiology Office Note:    Date:  12/16/2023   ID:  William Preston, DOB [birth year], MRN [id number]  PCP:  Marchelle Clem Pitts, MD  [facility] Cardiologist:  Dorn Lesches, MD  [facility] Structural heart: Lonni Cash, MD [facility] Electrophysiologist:  None   Referring MD: Marchelle Clem Pitts, *   [facility] s/p TAVR  History of Present Illness:    William Preston is a 73 y.o. male with a hx of persistent atrial fibrillation on Eliquis , HLD, HTN, DMT2, sleep apnea on CPAP and severe aortic stenosis s/p TAVR (12/10/23) who presents to clinic for follow up.    He has been followed by Dr. Lesches for aortic stenosis. Echo 07/16/23 with EF 60%, severe AS with mean grad 30 mmHg, Vmax 3.79 m/s, AVA 0.74 cm2, DVI 0.21, SVI 29, mild-mod MR & TR. Cardiac cath 09/30/23 with moderate non-obstructive CAD with plans for medical management of his CAD. He complained of progressive dyspnea on exertion, fatigue and LE edema. S/p  TAVR with a 26 mm Edwards Sapien 3 Ultra Resilia THV via the TF approach on 12/10/23. Post operative echo showed EF 60%, mod LVH, normally functioning TAVR with a mean gradient of 9 mmHg and no PVL. Had afib with RVR after TAVR that improved with resuming home meds.    Today the patient presents to clinic for follow up. Here with his wife. He is doing well. No CP or SOB. No LE edema, orthopnea or PND. No dizziness or syncope. No blood in stool or urine. No palpitations. He did have some mild fatigue for 2 days after surgery but now feeling better.  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 Cash and Dr. Shyrl   Severe aortic stenosis    Type II diabetes mellitus 11/06/2012     Current Medications: Current Meds  Medication Sig   amoxicillin (AMOXIL) 500 MG tablet Take 4 tablets (2,000 mg total) by mouth as directed. 1 hour prior to dental work including cleanings   apixaban  (ELIQUIS ) 5 MG TABS tablet Take 1 tablet (5 mg total) by mouth 2 (two) times daily.   Ascorbic Acid (VITAMIN C) 1000 MG tablet Take 1,000 mg by mouth daily.   cholecalciferol (VITAMIN D) 1000 units tablet Take 1,000 Units by mouth daily.   diclofenac Sodium (VOLTAREN ARTHRITIS PAIN) 1 % GEL Apply 2 g topically daily as needed (Knee pain).   digoxin  (LANOXIN ) 0.25 MG tablet Take 1/2 (one-half) tablet by mouth once daily   diltiazem  (CARDIZEM  CD) 240 MG 24 hr capsule Take 1 capsule (240 mg total) by mouth daily.   doxazosin  (CARDURA ) 4 MG tablet Take 4 mg by mouth daily.   finasteride  (PROSCAR ) 5 MG tablet Take 5 mg by mouth daily.   furosemide  (LASIX ) 40 MG  tablet Take 1 tablet (40 mg total) by mouth daily. (Patient taking differently: Take 40 mg by mouth daily as needed for fluid or edema.)   glyBURIDE (DIABETA) 2.5 MG tablet Take 2.5 mg by mouth 3 (three) times daily.   losartan  (COZAAR ) 50 MG tablet Take 1 tablet (50 mg total) by mouth daily.   MAGNESIUM  GLYCINATE PO Take 500 mg by mouth daily.   metFORMIN (GLUCOPHAGE) 500 MG tablet Take 500 mg by mouth 3 (three) times daily.   Multiple Vitamins-Minerals (MULTIVITAMIN WITH MINERALS) tablet Take 1 tablet by mouth daily.   Multiple Vitamins-Minerals (PRESERVISION AREDS 2 PO) Take 1 capsule by mouth 2 (two) times daily.   pravastatin  (PRAVACHOL ) 80 MG tablet Take 1 tablet (80 mg total) by mouth daily.   vitamin B-12 (CYANOCOBALAMIN) 1000 MCG tablet Take 1,000 mcg by mouth daily.   vitamin E 400  UNIT capsule Take 400 Units by mouth daily.   [DISCONTINUED] metoprolol  tartrate (LOPRESSOR ) 25 MG tablet Take 1 tablet (25 mg total) by mouth daily.      ROS:   Please see the history of present illness.    All other systems reviewed and are negative.  EKGs   EKG Interpretation Date/Time:  Monday December 16 2023 10:14:17 EST Ventricular Rate:  109 PR Interval:    QRS Duration:  86 QT Interval:  310 QTC Calculation: 417 R Axis:   -31  Text Interpretation: Atrial fibrillation with rapid ventricular response Left axis deviation Minimal voltage criteria for LVH, may be normal variant ( Cornell product ) Anterior infarct , age undetermined Confirmed by Sebastian Collar [phone]) on 12/16/2023 10:20:28 AM   Risk Assessment/Calculations:    CHA2DS2-VASc Score = 5   This indicates a 7.2% annual risk of stroke. The patient's score is based upon: CHF History: 1 HTN History: 1 Diabetes History: 1 Stroke History: 0 Vascular Disease History: 0 Age Score: 2 Gender Score: 0          Physical Exam:    VS:  BP 136/68   Pulse (!) 109   Ht 5' 9 (1.753 m)   Wt 185 lb 9.6 oz (84.2 kg)   SpO2 98%   BMI 27.41 kg/m     Wt Readings from Last 3 Encounters:  12/16/23 185 lb 9.6 oz (84.2 kg)  12/10/23 186 lb 6.4 oz (84.5 kg)  11/22/23 186 lb 6.4 oz (84.6 kg)     GEN: Well nourished, well developed in no acute distress NECK: No JVD CARDIAC: irreg irreg, mildly tachy, no murmurs, rubs, gallops RESPIRATORY:  Clear to auscultation without rales, wheezing or rhonchi  ABDOMEN: Soft, non-tender, non-distended EXTREMITIES:  No edema; No deformity.  Groin sites clear without hematoma or ecchymosis.   ASSESSMENT:    1. S/P TAVR (transcatheter aortic valve replacement)   2. Essential hypertension, benign   3. Dyslipidemia   4. Chronic atrial fibrillation (HCC)   5. Disorder of peritoneum   6. Pulmonary nodules     PLAN:    In order of problems listed above:  Severe AS s/p  TAVR:  -- Pt doing well s/p TAVR.  -- ECG with no HAVB.  -- Groin sites healing well.  -- SBE prophylaxis discussed; I have RX'd amoxicillin .   -- Continue Eliquis  5mg  BID.  -- Cleared to resume all activities without restriction. -- I will see back for 1 month echo and OV.  HTN: -- BP well controlled.  -- Continue losartan  50mg  daily, lopressor  25mg  BID (increased from daily), and  Cardizem  CD 240mg  daily.    HLD: -- Continue pravastatin  80mg  daily.   Persistent atrial fibrillation: -- HR mildly elevated today: 109 bpm.   -- Continue digoxin  0.125mg  daily and Cardizem  CD 240mg  daily. Increase metoprolol  25mg  from daily to BID.  -- Continue home Eliquis  5mg  BID.    Peritoneal nodules: -- Noted on pre TAVR CT and referred to oncology rapid diagnostic clinic. 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 No further workup is required at this time.  -- They plan 6 month CT abdomen/pelvis for follow up.    Pulmonary nodules: -- Measuring 7 mm noted on pre TAVR CTs.  -- Non-contrast chest CT at 3-6 months is recommended. If the nodules are stable at time of repeat CT, then future CT at 18-24 months (from today's scan) is considered optional for low-risk patients, but is recommended for high-risk patients.  -- This was discussed and non contrast CT ordered. Pt given radiology scheduling number to arrange for mid January.        Medication Adjustments/Labs and Tests Ordered: Current medicines are reviewed at length with the patient today.  Concerns regarding medicines are outlined above.  Orders Placed This Encounter  Procedures   CT CHEST WO CONTRAST   EKG 12-Lead   ECHOCARDIOGRAM COMPLETE   Meds ordered this encounter  Medications   amoxicillin (AMOXIL) 500 MG tablet    Sig: Take 4 tablets (2,000 mg total) by mouth as directed. 1 hour prior to dental work including cleanings    Dispense:  12 tablet    Refill:   12    Supervising Provider:   COOPER, MICHAEL [[id number]]   metoprolol  tartrate (LOPRESSOR ) 25 MG tablet    Sig: Take 1 tablet (25 mg total) by mouth 2 (two) times daily.    Dispense:  180 tablet    Refill:  3    Patient Instructions  Medication Instructions:  Your physician has recommended you make the following change in your medication:  INCREASE metoprolol  to 25mg  twice daily,  instead of once daily. START Amoxicillin 500 mg, taking 4 tablets by mouth 1 hour prior to dental procedures and cleanings.   *If you need a refill on your cardiac medications before your next appointment, please call your pharmacy*  Lab Work: None needed If you have labs (blood work) drawn today and your tests are completely normal, you will receive your results only by: MyChart Message (if you have MyChart) OR A paper copy in the mail If you have any lab test that is abnormal or we need to change your treatment, we will call you to review the results.  Testing/Procedures: 01/20/2024 Your physician has requested that you have an echocardiogram. Echocardiography is a painless test that uses sound waves to create images of your heart. It provides your doctor with information about the size and shape of your heart and how well your heart's chambers and valves are working. This procedure takes approximately one hour. There are no restrictions for this procedure. Please do NOT wear cologne, perfume, aftershave, or lotions (deodorant is allowed). Please arrive 15 minutes prior to your appointment time.  Please note: We ask at that you not bring children with you during ultrasound (echo/ vascular) testing. Due to room size and safety concerns, children are not allowed in the ultrasound rooms during exams. Our front office staff cannot provide observation of children in our lobby area while testing is being conducted. An adult  accompanying a patient to their appointment will only be allowed in the ultrasound room at the  discretion of the ultrasound technician under special circumstances. We apologize for any inconvenience.   Follow-Up: At [facility], you and your health needs are our priority.  As part of our continuing mission to provide you with exceptional heart care, our providers are all part of one team.  This team includes your primary Cardiologist (physician) and Advanced Practice Providers or APPs (Physician Assistants and Nurse Practitioners) who all work together to provide you with the care you need, when you need it.  Your next appointment:   As scheduled on 01/20/2024  Provider:   Izetta Hummer, PA-C    We recommend signing up for the patient portal called MyChart.  Sign up information is provided on this After Visit Summary.  MyChart is used to connect with patients for Virtual Visits (Telemedicine).  Patients are able to view lab/test results, encounter notes, upcoming appointments, etc.  Non-urgent messages can be sent to your provider as well.   To learn more about what you can do with MyChart, go to [email or web address].   Other Instructions Izetta would like for you to have a CT scan on your lungs. Please call [phone] to schedule an appointment for the middle of January.           Signed, Lamarr Hummer, PA-C  12/16/2023 11:33 AM    [facility]

## 2023-12-16 ENCOUNTER — Ambulatory Visit: Attending: Physician Assistant | Admitting: Physician Assistant

## 2023-12-16 ENCOUNTER — Telehealth (HOSPITAL_COMMUNITY): Payer: Self-pay

## 2023-12-16 VITALS — BP 136/68 | HR 109 | Ht 69.0 in | Wt 185.6 lb

## 2023-12-16 DIAGNOSIS — E785 Hyperlipidemia, unspecified: Secondary | ICD-10-CM

## 2023-12-16 DIAGNOSIS — I482 Chronic atrial fibrillation, unspecified: Secondary | ICD-10-CM

## 2023-12-16 DIAGNOSIS — I10 Essential (primary) hypertension: Secondary | ICD-10-CM | POA: Diagnosis not present

## 2023-12-16 DIAGNOSIS — K669 Disorder of peritoneum, unspecified: Secondary | ICD-10-CM

## 2023-12-16 DIAGNOSIS — R918 Other nonspecific abnormal finding of lung field: Secondary | ICD-10-CM | POA: Diagnosis present

## 2023-12-16 DIAGNOSIS — Z952 Presence of prosthetic heart valve: Secondary | ICD-10-CM | POA: Diagnosis present

## 2023-12-16 MED ORDER — AMOXICILLIN 500 MG PO TABS: 2000.0000 mg | ORAL_TABLET | ORAL | 12 refills | Status: AC

## 2023-12-16 MED ORDER — METOPROLOL TARTRATE 25 MG PO TABS: 25.0000 mg | ORAL_TABLET | Freq: Two times a day (BID) | ORAL | 3 refills | Status: AC

## 2023-12-16 NOTE — Patient Instructions (Signed)
 Medication Instructions:  Your physician has recommended you make the following change in your medication:  INCREASE metoprolol  to 25mg  twice daily,  instead of once daily. START Amoxicillin  500 mg, taking 4 tablets by mouth 1 hour prior to dental procedures and cleanings.   *If you need a refill on your cardiac medications before your next appointment, please call your pharmacy*  Lab Work: None needed If you have labs (blood work) drawn today and your tests are completely normal, you will receive your results only by: MyChart Message (if you have MyChart) OR A paper copy in the mail If you have any lab test that is abnormal or we need to change your treatment, we will call you to review the results.  Testing/Procedures: 01/20/2024 Your physician has requested that you have an echocardiogram. Echocardiography is a painless test that uses sound waves to create images of your heart. It provides your doctor with information about the size and shape of your heart and how well your heart's chambers and valves are working. This procedure takes approximately one hour. There are no restrictions for this procedure. Please do NOT wear cologne, perfume, aftershave, or lotions (deodorant is allowed). Please arrive 15 minutes prior to your appointment time.  Please note: We ask at that you not bring children with you during ultrasound (echo/ vascular) testing. Due to room size and safety concerns, children are not allowed in the ultrasound rooms during exams. Our front office staff cannot provide observation of children in our lobby area while testing is being conducted. An adult accompanying a patient to their appointment will only be allowed in the ultrasound room at the discretion of the ultrasound technician under special circumstances. We apologize for any inconvenience.   Follow-Up: At [facility], you and your health needs are our priority.  As part of our continuing mission to provide you  with exceptional heart care, our providers are all part of one team.  This team includes your primary Cardiologist (physician) and Advanced Practice Providers or APPs (Physician Assistants and Nurse Practitioners) who all work together to provide you with the care you need, when you need it.  Your next appointment:   As scheduled on 01/20/2024  Provider:   Izetta Hummer, PA-C    We recommend signing up for the patient portal called MyChart.  Sign up information is provided on this After Visit Summary.  MyChart is used to connect with patients for Virtual Visits (Telemedicine).  Patients are able to view lab/test results, encounter notes, upcoming appointments, etc.  Non-urgent messages can be sent to your provider as well.   To learn more about what you can do with MyChart, go to [email or web address].   Other Instructions Izetta would like for you to have a CT scan on your lungs. Please call [phone] to schedule an appointment for the middle of January.

## 2023-12-16 NOTE — Telephone Encounter (Signed)
 Per Phase 1 Cardiac Rehab, faxed referral to [facility].

## 2023-12-17 ENCOUNTER — Encounter (INDEPENDENT_AMBULATORY_CARE_PROVIDER_SITE_OTHER): Payer: Self-pay | Admitting: *Deleted

## 2024-01-11 NOTE — Progress Notes (Addendum)
 " [facility] V[facility]ENTER   MULTIDISCIPLINARY HEART VALVE CLINIC                                     Cardiology Office Note:    Date:  01/20/2024   ID:  William Preston, DOB 27-Nov-1950, MRN [id number]  PCP:  Marchelle Clem Pitts, MD  [facility] Cardiologist:  Dorn Lesches, MD  [facility] Structural heart: Lonni Cash, MD [facility] Electrophysiologist:  None   Referring MD: Marchelle Clem Pitts, MD   1 month s/p TAVR  History of Present Illness:    William Preston is a 73 y.o. male with a hx of persistent atrial fibrillation on Eliquis , HLD, HTN, DMT2, sleep apnea on CPAP and severe aortic stenosis s/p TAVR (12/10/23) who presents to clinic for follow up.    He has been followed by Dr. Lesches for aortic stenosis. Echo 07/16/23 with EF 60%, severe AS with mean grad 30 mmHg, Vmax 3.79 m/s, AVA 0.74 cm2, DVI 0.21, SVI 29, mild-mod MR & TR. Cardiac cath 09/30/23 with moderate non-obstructive CAD with plans for medical management of his CAD. He complained of progressive dyspnea on exertion, fatigue and LE edema. S/p  TAVR with a 26 mm Edwards Sapien 3 Ultra Resilia THV via the TF approach on 12/10/23. Post operative echo showed EF 60%, mod LVH, normally functioning TAVR with a mean gradient of 9 mmHg and no PVL. Had afib with RVR after TAVR that improved with resuming home meds. He continued to have elevated rates and metoprolol  increased.    Today the patient presents to clinic for follow up. He has been limited by knee pain w/ need for knee replacement. He is also worried about falling. This contributes to his being very sedentary. He does have some dizziness which has predated TAVR as well as some orthostasis which is well controlled with standing slowly. No CP or SOB. No LE edema, orthopnea or PND. No dizziness or syncope. No blood in stool or urine. No palpitations. He does have significant fatigue. Has had some cogniniteve impairmenet daiting back from 2014. Evaluated by  neurology in 2022 with full neuropsych eval. No major cause identified. Since TAVR he has had significant progression of his memory impairment like forgetting he saw a whole movie or writing things on grocery lists.    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 Cash and Dr. Shyrl   Severe aortic stenosis    Type II diabetes mellitus 11/06/2012     Current Medications: Current Meds  Medication Sig   amoxicillin  (AMOXIL ) 500 MG tablet Take 4 tablets (2,000 mg total) by mouth as directed. 1 hour prior to dental work including cleanings   Ascorbic Acid (VITAMIN C) 1000 MG tablet Take 1,000 mg by mouth daily.   cholecalciferol (VITAMIN D) 1000 units tablet Take 1,000 Units by mouth daily.   diclofenac Sodium (VOLTAREN ARTHRITIS PAIN) 1 % GEL Apply 2 g topically daily as needed (Knee  pain).   digoxin  (LANOXIN ) 0.25 MG tablet Take 1/2 (one-half) tablet by mouth once daily   diltiazem  (CARDIZEM  CD) 240 MG 24 hr capsule Take 1 capsule (240 mg total) by mouth daily.   doxazosin  (CARDURA ) 4 MG tablet Take 4 mg by mouth daily.   finasteride  (PROSCAR ) 5 MG tablet Take 5 mg by mouth daily.   furosemide  (LASIX ) 40 MG tablet Take 1 tablet (40 mg total) by mouth daily.   glyBURIDE (DIABETA) 2.5 MG tablet Take 2.5 mg by mouth 3 (three) times daily.   losartan  (COZAAR ) 50 MG tablet Take 1 tablet (50 mg total) by mouth daily.   MAGNESIUM  GLYCINATE PO Take 500 mg by mouth daily.   metFORMIN (GLUCOPHAGE) 500 MG tablet Take 500 mg by mouth 3 (three) times daily.   metoprolol   tartrate (LOPRESSOR ) 25 MG tablet Take 1 tablet (25 mg total) by mouth 2 (two) times daily.   Multiple Vitamins-Minerals (MULTIVITAMIN WITH MINERALS) tablet Take 1 tablet by mouth daily.   Multiple Vitamins-Minerals (PRESERVISION AREDS 2 PO) Take 1 capsule by mouth 2 (two) times daily.   pravastatin  (PRAVACHOL ) 80 MG tablet Take 1 tablet (80 mg total) by mouth daily.   vitamin B-12 (CYANOCOBALAMIN) 1000 MCG tablet Take 1,000 mcg by mouth daily.   vitamin E 400 UNIT capsule Take 400 Units by mouth daily.   [DISCONTINUED] apixaban  (ELIQUIS ) 5 MG TABS tablet Take 1 tablet (5 mg total) by mouth 2 (two) times daily.      ROS:   Please see the history of present illness.    All other systems reviewed and are negative.  EKGs       Risk Assessment/Calculations:    CHA2DS2-VASc Score = 5   This indicates a 7.2% annual risk of stroke. The patient's score is based upon: CHF History: 1 HTN History: 1 Diabetes History: 1 Stroke History: 0 Vascular Disease History: 0 Age Score: 2 Gender Score: 0          Physical Exam:    VS:  BP 120/66   Pulse 89   Ht 5' 9 (1.753 m)   Wt 185 lb 3.2 oz (84 kg)   SpO2 96%   BMI 27.35 kg/m     Wt Readings from Last 3 Encounters:  01/20/24 185 lb 3.2 oz (84 kg)  12/16/23 185 lb 9.6 oz (84.2 kg)  12/10/23 186 lb 6.4 oz (84.5 kg)     GEN: Well nourished, well developed in no acute distress NECK: No JVD CARDIAC: irreg irreg, no murmurs, rubs, gallops RESPIRATORY:  Clear to auscultation without rales, wheezing or rhonchi  ABDOMEN: Soft, non-tender, non-distended EXTREMITIES:  No edema; No deformity.    ASSESSMENT:    1. S/P TAVR (transcatheter aortic valve replacement)   2. Essential hypertension, benign   3. Dyslipidemia   4. Chronic atrial fibrillation (HCC)   5. Disorder of peritoneum   6. Pulmonary nodules   7. Memory impairment      PLAN:    In order of problems listed above:  Severe AS s/p TAVR:  -- Echo today shows EF  60%, severe cLVH, mild RV dysfunction, severe LAE, mild-mod MR, normally functioning TAVR with a mean gradient of 4.6 mm hg and no PVL.  -- NYHA class II, mostly of fatigue. -- I have reached out to cardiac rehab as he has not heard from [facility].  -- SBE prophylaxis discussed; I have RX'd amoxicillin .   -- Continue Eliquis  5mg  BID.  -- I will  see back for 1 year echo and OV.  HTN: -- BP well controlled.  -- Continue losartan  50mg  daily, Lopressor  25mg  BID, and Cardizem  CD 240mg  daily.    HLD: -- Continue pravastatin  80mg  daily.   Persistent atrial fibrillation: -- HR 89 bpm today.  -- Continue digoxin  0.125mg  daily, Cardizem  CD 240mg  daily, and metoprolol  25mg  BID.  -- Continue home Eliquis  5mg  BID.    Peritoneal nodules: -- Noted on pre TAVR CT and referred to oncology rapid diagnostic clinic. 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 No further workup is required at this time.  -- They plan 6 month CT abdomen/pelvis for follow up.    Pulmonary nodules: -- Measuring 7 mm noted on pre TAVR CTs.  -- Non-contrast chest CT at 3-6 months is recommended. If the nodules are stable at time of repeat CT, then future CT at 18-24 months (from today's scan) is considered optional for low-risk patients, but is recommended for high-risk patients.  -- CT scheduled for 02/03/24.   Worsening memory impairment: -- Has had some cognitive impairment dating back from 2014. Evaluated by neurology in 2022 with full neuropsych eval. No major cause identified. Since TAVR he has had significant progression of his memory impairment like forgetting he saw a whole movie or writing things on grocery lists.  -- Wife has been very worried about this. I think his mild cognitive impairment was worsened by potential showering of brain during TAVR procedure +/- anesthesia.  -- Referral to neurology.   Knee OA: -- Knee bone on bone requiring  TKA. -- He is cleared from a cardiology standpoint to proceed, but he has had worsening neuro issues since TAVR, and I would like neuro to weigh in on risk for anesthesia.  -- If cleared by neuro, I encouraged repair so he can become more active which I think will help his fatigue.    Medication Adjustments/Labs and Tests Ordered: Current medicines are reviewed at length with the patient today.  Concerns regarding medicines are outlined above.  Orders Placed This Encounter  Procedures   Ambulatory referral to Neurology   ECHOCARDIOGRAM COMPLETE   Meds ordered this encounter  Medications   apixaban  (ELIQUIS ) 5 MG TABS tablet    Sig: Take 1 tablet (5 mg total) by mouth 2 (two) times daily.    Dispense:  180 tablet    Refill:  3    Patient Instructions  Medication Instructions:  Your physician recommends that you continue on your current medications as directed. Please refer to the Current Medication list given to you today.  A refill of your Eliquis  has been sent to your pharmacy.   *If you need a refill on your cardiac medications before your next appointment, please call your pharmacy*  Lab Work: None needed If you have labs (blood work) drawn today and your tests are completely normal, you will receive your results only by: MyChart Message (if you have MyChart) OR A paper copy in the mail If you have any lab test that is abnormal or we need to change your treatment, we will call you to review the results.  Testing/Procedures: 11/26/24 Your physician has requested that you have an echocardiogram. Echocardiography is a painless test that uses sound waves to create images of your heart. It provides your doctor with information about the size and shape of your heart and how well your hearts chambers and valves are working. This procedure takes approximately  one hour. There are no restrictions for this procedure. Please do NOT wear cologne, perfume, aftershave, or lotions (deodorant  is allowed). Please arrive 15 minutes prior to your appointment time.  Please note: We ask at that you not bring children with you during ultrasound (echo/ vascular) testing. Due to room size and safety concerns, children are not allowed in the ultrasound rooms during exams. Our front office staff cannot provide observation of children in our lobby area while testing is being conducted. An adult accompanying a patient to their appointment will only be allowed in the ultrasound room at the discretion of the ultrasound technician under special circumstances. We apologize for any inconvenience.   Follow-Up: At [facility], you and your health needs are our priority.  As part of our continuing mission to provide you with exceptional heart care, our providers are all part of one team.  This team includes your primary Cardiologist (physician) and Advanced Practice Providers or APPs (Physician Assistants and Nurse Practitioners) who all work together to provide you with the care you need, when you need it.  Your next appointment:   6 months  Provider:   Dr. Court  We recommend signing up for the patient portal called MyChart.  Sign up information is provided on this After Visit Summary.  MyChart is used to connect with patients for Virtual Visits (Telemedicine).  Patients are able to view lab/test results, encounter notes, upcoming appointments, etc.  Non-urgent messages can be sent to your provider as well.   To learn more about what you can do with MyChart, go to [email or web address].     Other Instructions You have been referred to [facility]. Their office will reach out to schedule an appointment.  We have reached out to Cardiac Rehab to help speed along the referral.      Signed, Lamarr Hummer, PA-C  01/20/2024 1:04 PM     [facility] "

## 2024-01-20 ENCOUNTER — Ambulatory Visit (INDEPENDENT_AMBULATORY_CARE_PROVIDER_SITE_OTHER): Admitting: Physician Assistant

## 2024-01-20 ENCOUNTER — Ambulatory Visit: Payer: Self-pay | Admitting: Physician Assistant

## 2024-01-20 ENCOUNTER — Ambulatory Visit
Admission: RE | Admit: 2024-01-20 | Discharge: 2024-01-20 | Disposition: A | Discharge status: Home / Self Care | Source: Ambulatory Visit

## 2024-01-20 VITALS — BP 120/66 | HR 89 | Ht 69.0 in | Wt 185.2 lb

## 2024-01-20 DIAGNOSIS — I482 Chronic atrial fibrillation, unspecified: Secondary | ICD-10-CM | POA: Insufficient documentation

## 2024-01-20 DIAGNOSIS — K669 Disorder of peritoneum, unspecified: Secondary | ICD-10-CM | POA: Diagnosis not present

## 2024-01-20 DIAGNOSIS — I10 Essential (primary) hypertension: Secondary | ICD-10-CM | POA: Insufficient documentation

## 2024-01-20 DIAGNOSIS — R918 Other nonspecific abnormal finding of lung field: Secondary | ICD-10-CM | POA: Insufficient documentation

## 2024-01-20 DIAGNOSIS — M179 Osteoarthritis of knee, unspecified: Secondary | ICD-10-CM

## 2024-01-20 DIAGNOSIS — R413 Other amnesia: Secondary | ICD-10-CM | POA: Insufficient documentation

## 2024-01-20 DIAGNOSIS — Z952 Presence of prosthetic heart valve: Secondary | ICD-10-CM | POA: Insufficient documentation

## 2024-01-20 DIAGNOSIS — E785 Hyperlipidemia, unspecified: Secondary | ICD-10-CM | POA: Insufficient documentation

## 2024-01-20 LAB — ECHOCARDIOGRAM COMPLETE
AR max vel: 1.9 cm2
AV Area VTI: 2.2 cm2
AV Area mean vel: 2.1 cm2
AV Mean grad: 4.6 mmHg
AV Peak grad: 9.4 mmHg
Ao pk vel: 1.53 m/s
Area-P 1/2: 3.88 cm2
S' Lateral: 3 cm

## 2024-01-20 MED ORDER — APIXABAN 5 MG PO TABS: 5.0000 mg | ORAL_TABLET | Freq: Two times a day (BID) | ORAL | 3 refills | Status: AC

## 2024-01-20 NOTE — Patient Instructions (Addendum)
 Medication Instructions:  Your physician recommends that you continue on your current medications as directed. Please refer to the Current Medication list given to you today.  A refill of your Eliquis  has been sent to your pharmacy.   *If you need a refill on your cardiac medications before your next appointment, please call your pharmacy*  Lab Work: None needed If you have labs (blood work) drawn today and your tests are completely normal, you will receive your results only by: MyChart Message (if you have MyChart) OR A paper copy in the mail If you have any lab test that is abnormal or we need to change your treatment, we will call you to review the results.  Testing/Procedures: 11/26/24 Your physician has requested that you have an echocardiogram. Echocardiography is a painless test that uses sound waves to create images of your heart. It provides your doctor with information about the size and shape of your heart and how well your hearts chambers and valves are working. This procedure takes approximately one hour. There are no restrictions for this procedure. Please do NOT wear cologne, perfume, aftershave, or lotions (deodorant is allowed). Please arrive 15 minutes prior to your appointment time.  Please note: We ask at that you not bring children with you during ultrasound (echo/ vascular) testing. Due to room size and safety concerns, children are not allowed in the ultrasound rooms during exams. Our front office staff cannot provide observation of children in our lobby area while testing is being conducted. An adult accompanying a patient to their appointment will only be allowed in the ultrasound room at the discretion of the ultrasound technician under special circumstances. We apologize for any inconvenience.   Follow-Up: At [facility], you and your health needs are our priority.  As part of our continuing mission to provide you with exceptional heart care, our providers  are all part of one team.  This team includes your primary Cardiologist (physician) and Advanced Practice Providers or APPs (Physician Assistants and Nurse Practitioners) who all work together to provide you with the care you need, when you need it.  Your next appointment:   6 months  Provider:   Dr. Court  We recommend signing up for the patient portal called MyChart.  Sign up information is provided on this After Visit Summary.  MyChart is used to connect with patients for Virtual Visits (Telemedicine).  Patients are able to view lab/test results, encounter notes, upcoming appointments, etc.  Non-urgent messages can be sent to your provider as well.   To learn more about what you can do with MyChart, go to [email or web address].     Other Instructions You have been referred to [facility]. Their office will reach out to schedule an appointment.  We have reached out to Cardiac Rehab to help speed along the referral.

## 2024-01-23 NOTE — Addendum Note (Signed)
 Addended by: JOSUE PEE A on: 01/23/2024 03:36 PM   Modules accepted: Orders

## 2024-02-03 ENCOUNTER — Ambulatory Visit (HOSPITAL_COMMUNITY)
Admission: RE | Admit: 2024-02-03 | Discharge: 2024-02-03 | Disposition: A | Discharge status: Home / Self Care | Source: Ambulatory Visit | Attending: Physician Assistant | Admitting: Physician Assistant

## 2024-02-03 DIAGNOSIS — R918 Other nonspecific abnormal finding of lung field: Secondary | ICD-10-CM | POA: Diagnosis present

## 2024-02-19 ENCOUNTER — Encounter: Payer: Self-pay | Admitting: Neurology

## 2024-02-19 ENCOUNTER — Telehealth: Payer: Self-pay | Admitting: Neurology

## 2024-02-19 ENCOUNTER — Ambulatory Visit: Admitting: Neurology

## 2024-02-19 VITALS — BP 109/62 | HR 78 | Ht 69.0 in | Wt 192.0 lb

## 2024-02-19 DIAGNOSIS — Z952 Presence of prosthetic heart valve: Secondary | ICD-10-CM

## 2024-02-19 DIAGNOSIS — R419 Unspecified symptoms and signs involving cognitive functions and awareness: Secondary | ICD-10-CM

## 2024-02-19 DIAGNOSIS — G4733 Obstructive sleep apnea (adult) (pediatric): Secondary | ICD-10-CM

## 2024-02-19 DIAGNOSIS — R413 Other amnesia: Secondary | ICD-10-CM

## 2024-02-19 DIAGNOSIS — Z818 Family history of other mental and behavioral disorders: Secondary | ICD-10-CM | POA: Diagnosis not present

## 2024-02-19 DIAGNOSIS — I482 Chronic atrial fibrillation, unspecified: Secondary | ICD-10-CM | POA: Diagnosis not present

## 2024-02-19 NOTE — Progress Notes (Signed)
 Subjective:    Patient ID: William Preston is a 74 y.o. male.  HPI    True Mar, MD, PhD [facility] [address] [address] [place], [place] [address]  Dear Lamarr,  I saw your patient, William Preston, upon your kind request in my neurologic clinic today for evaluation of his memory loss.  The patient is accompanied by his wife today.  As you know, [name] is a 74 year old male with an underlying medical history of hypertension, hyperlipidemia, chronic A-fib on Eliquis , history of pulmonary nodules, osteoarthritis, diabetes, sleep apnea on PAP therapy, aortic stenosis with status post TAVR in 2025-11-29and overweight state, who reports having trouble with short-term memory, retaining new information and sequencing tasks.  He has not had any trouble driving.  His wife supplements his history and reports that his sister has been diagnosed with dementia in the past couple years.  He has not had any issues navigating while driving thankfully.  He has not had any sudden onset of one-sided weakness or numbness or tingling or droopy face or slurring of speech or recurrent headaches.  He uses his CPAP regularly and is faithful with it.  He does not always hydrate great, he estimates that he drinks about 2 bottles of water  per day on average, 16.9 ounce size each.  He does not drink any alcohol.  He is a non-smoker.  He takes vitamin B12 by mouth.  He had labs this morning through PCP office.  His wife reports that since his TAVR in 29-Nov-2025he has had more pronounced issues with memory and her perception.  He has hearing aids bilaterally and is up to date as far as checkup goes, he sees his eye doctor twice a year.  He is status post cataract surgeries.  He has osteoarthritis of his left knee and has an appointment tomorrow with his orthopedic specialist in [place], [place].  He will likely need knee replacement surgery.  I reviewed your office note from 01/20/2024.   Of note, I had evaluated him for memory concerns several years ago.  A brain MRI in [place], [place] in 2022 showed global atrophy.  He had a head CT without contrast on 09/26/2021 and I reviewed results in his extended electronic chart:  IMPRESSION:  1. No acute intracranial hemorrhage or mass effect.  2.  Mild focal soft tissue swelling in the right forehead. No evidence of acute calvarial fracture.   He had a head CT without contrast through [facility] emergency room on 07/17/2005 and I reviewed the results: IMPRESSION:  Normal head CT.    In addition, I personally and independently reviewed images through the PACS system. He had an EEG on 09/14/2020 and I reviewed the results:  Impression: This is a normal EEG recording in the waking and drowsy state. No evidence of ictal or interictal discharges are seen.     He is on CPAP therapy.  He reports compliance with treatment.  Sleep testing was several years ago at the [facility] sleep center and I reviewed test results and his scanned reports from 01/12/2008.  This was a titration study.  He was diagnosed with severe obstructive sleep apnea with a baseline sleep study on 12/25/2007 which showed an AHI of 91.4/h, O2 nadir 77%.  He had neuropsychological evaluation and testing with [facility] and I reviewed test results and recommendations from Dr. Arthea Maryland from 05/19/2021:  << Clinical Impression(s): [name] pattern of performance  is suggestive of a few isolated deficits, including a spatial cognitive flexibility task, across yes/no recognition trials throughout memory tasks, and motor functioning when using his non-dominant (left) hand. Performance variability was also exhibited across semantic fluency, as well as both encoding (i.e., learning) and retrieval aspects of memory. Performances were appropriate relative to age-matched peers across processing speed, attention/concentration, other executive  functioning tasks, receptive language, phonemic fluency, confrontation naming, visuospatial abilities, and motor functioning using his dominant (right) hand. [name] denied difficulties completing instrumental activities of daily living (ADLs) independently. At the present time, I do not feel that [name] quite meets diagnostic criteria for a formal cognitive disorder diagnosis. However, he is closer to the cut-off of where a mild cognitive impairment conceptualization could be argued.   The etiology for isolated weaknesses and performance variability is unclear. Across memory tasks, poor discriminability across yes/no recognition tasks represented his only consistent weakness. His performances suggest difficulty differentiating sources of information as nearly all false positive errors were information presented to him at some point or very similar to correct information. I do not believe that scores currently suggest rapid forgetting or a memory storage deficit, especially as retention percentages at a delay were appropriate throughout. Current memory scores are not consistent with Alzheimer's disease. However, given said variability and he and his wife's report of slow decline since 2014, monitoring over time will be important to see if further decline occurs.    Said variability could certainly be influenced by vascular causes as he has several conditions in his medical history (e.g., hypertension, atrial fibrillation, aortic stenosis, dyslipidemia, type II diabetes). His recent brain MRI revealed age-appropriate vascular changes and no prior stroke history. Cognitive and behavioral characteristics are not concerning for Lewy body dementia, frontotemporal dementia, or another more rare parkinsonian condition.   Curiously, there is some evidence to suggest localized mild right frontal dysfunction. This would be evidenced by isolated weaknesses across a spatial sequencing task Engineer, Agricultural Fluency,  condition 3) and diminished motor abilities when using his left hand. The clinical significance of these findings remains unclear at the present time. Continued medical monitoring will be important moving forward.   Recommendations: A repeat neuropsychological evaluation in 18-24 months (or sooner if functional decline is noted) is recommended to assess the trajectory of future cognitive decline should it occur. This will also aid in future efforts towards improved diagnostic clarity.   Mr. Mcclish is encouraged to attend to lifestyle factors for brain health (e.g., regular physical exercise, good nutrition habits, regular participation in cognitively-stimulating activities, and general stress management techniques), which are likely to have benefits for both emotional adjustment and cognition. Optimal control of vascular risk factors (including safe cardiovascular exercise and adherence to dietary recommendations) is encouraged. Likewise, continued compliance with his CPAP machine will also be important. Continued participation in activities which provide mental stimulation and social interaction is also recommended.    When learning new information, he would benefit from information being broken up into small, manageable pieces. He may also find it helpful to articulate the material in his own words and in a context to promote encoding at the onset of a new task. This material may need to be repeated multiple times to promote encoding.   Memory can be improved using internal strategies such as rehearsal, repetition, chunking, mnemonics, association, and imagery. External strategies such as written notes in a consistently used memory journal, visual and nonverbal auditory cues such as a calendar on the refrigerator or appointments with  alarm, such as on a cell phone, can also help maximize recall.     To address problems with processing speed, he may wish to consider:   -Ensuring that he is alerted when  essential material or instructions are being presented   -Adjusting the speed at which new information is presented   -Allowing for more time in comprehending, processing, and responding in conversation   To address problems with fluctuating attention, he may wish to consider:   -Avoiding external distractions when needing to concentrate   -Limiting exposure to fast paced environments with multiple sensory demands   -Writing down complicated information and using checklists   -Attempting and completing one task at a time (i.e., no multi-tasking)   -Verbalizing aloud each step of a task to maintain focus   -Reducing the amount of information considered at one time >>    Previously (copied from previous notes for reference):    09/01/2020: 74 year old right-handed gentleman with an underlying medical history of hypertension, hyperlipidemia, diabetes, atrial fibrillation, obstructive sleep apnea, on CPAP therapy, and mild obesity, who reports difficulty with her short-term memory for the past few years.  Symptoms date back to 2016 or earlier.  He started seeing a neurologist in 2016.  He has been forgetful, mostly short-term difficulty including misplacing things.  He has not had any difficulty driving or navigating.  Wife reports that he is a good driver.  He has not had any recent falls.  Never had any severe headaches or sudden onset of one-sided weakness or numbness or tingling or droopy face or slurring of speech.  He is fully compliant with his CPAP and per wife has done really well with it and is religious with its usage. He tries to hydrate well but could do a little better.  He is a non-smoker, he has not had any alcohol in about 50 years.  As far as exercise, he tries to walk on a regular basis.  He is in regular follow-up with ophthalmology every 6 months, he sees a specialist in [place], [place]. He has no obvious family history of dementia, mom lived to be 42 and father lived to be 15, both  had heart disease.  He has 2 sisters and 1 brother.  1 sister is older and 1 is younger, brother is older.  None of them have memory loss as far as he knows.  His wife supplements his history, she reports that at the end of May he had a couple of episodes of confusion, he was zoned out, he would stare into space.  Some days later, he started having swelling on one of his fingers and was treated for cellulitis with a 10-day course of clindamycin.  He had a similar cellulitis type change in his right toe afterwards.  He has been seeing a podiatrist.  His toe is healing. Wife reports that he has had diabetic neuropathy for years.  He has had hearing aids.   He is retired, he worked as a financial controller.  They have 3 children, 1 son lives in [place] , 1 in [place] , and daughter lives in [place] .   I reviewed your office note from 06/22/2020.  He reported episodes of zoning out and staring.  He reported difficulty with word finding.  He also reported dizziness when standing.  Of note, he is on multiple medications including Cardizem , digoxin , Cozaar , furosemide , metoprolol , doxazosin , Eliquis , pravastatin , potassium, finasteride , glyburide. He had a remote head CT without contrast on 07/17/2005 and I  reviewed the results: Impression: Normal head CT.   He had a recent brain MRI.  He had a brain MRI without contrast through [facility] imaging center on 07/06/2020 and I reviewed the results: Impression: No acute abnormalities.  Sinus disease right maxillary sinus.  Involutional changes. (In the body of the report there is mentioning of age-appropriate global atrophy.)   In the office on 06/22/2020 his sitting blood pressure was 110/82, standing 100/80.  He has recently seen a neurologist, prior neurology records are not available for my review today.  We will request office records, he is willing to sign a release of information form.  He reports that he has been treated with supplements including  over-the-counter vitamin D and vitamin B12. He requested a second opinion. He had blood work through your office on 04/06/2020 and I was able to review the results, CMP showed glucose of 109, BUN 16, creatinine 0.92, alk phos 56, AST 24, ALT 25.  Cholesterol panel showed total cholesterol of 133, triglycerides 193, HDL 34, LDL 67.  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 Verlin and Dr. Shyrl   Severe aortic stenosis    Type II diabetes mellitus 11/06/2012    His Past Surgical History Is Significant For: Past Surgical History:  Procedure Laterality Date   CARDIAC CATHETERIZATION  11/2003   normal   CARDIOVERSION  05/25/2008   successful   CATARACT EXTRACTION Bilateral    COLONOSCOPY N/A 06/20/2017   Surgeon: Harvey Margo CROME, MD; 6 polyps removed (tubular adenomas), diverticulosis in the descending, sigmoid, and rectosigmoid colon, external and internal hemorrhoids, redundant left colon.  Recommended repeat in 3 years.   COLONOSCOPY WITH PROPOFOL  N/A 01/30/2021   Procedure: COLONOSCOPY WITH PROPOFOL ;  Surgeon: Cindie Carlin POUR, DO;  Location: [facility];  Service: Endoscopy;  Laterality: N/A;  7:30am   INTRAOPERATIVE TRANSTHORACIC ECHOCARDIOGRAM N/A 12/10/2023   Procedure: ECHOCARDIOGRAM, TRANSTHORACIC;  Surgeon: Verlin Lonni BIRCH, MD;  Location: [facility] INVASIVE CV LAB;  Service: Cardiovascular;  Laterality: N/A;   NM MYOCAR PERF WALL MOTION  10/29/2007   low risk   POLYPECTOMY   06/20/2017   Procedure: POLYPECTOMY;  Surgeon: Harvey Margo CROME, MD;  Location: [facility];  Service: Endoscopy;;  ascending colon x3, transverse colon polyp (cb)   POLYPECTOMY  01/30/2021   Procedure: POLYPECTOMY;  Surgeon: Cindie Carlin POUR, DO;  Location: [facility];  Service: Endoscopy;;   RIGHT/LEFT HEART CATH AND CORONARY ANGIOGRAPHY N/A 09/30/2023   Procedure: RIGHT/LEFT HEART CATH AND CORONARY ANGIOGRAPHY;  Surgeon: Wonda Sharper, MD;  Location: [facility] INVASIVE CV LAB;  Service: Cardiovascular;  Laterality: N/A;   ruptured spleen     age 3, fell off a tractor that pushed him across a field   SPLENECTOMY, 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   UMBILICAL HERNIA REPAIR     US  ECHOCARDIOGRAPHY  11/19/2008   LA mod-severely dilated, RA mildly dilated,mild MR,mild to mod TR,trace AI    His Family History Is Significant For: Family  History  Problem Relation Age of Onset   Heart failure Mother    Heart attack Mother    Heart failure Father    Heart disease Father    Hyperlipidemia Sister    Prostate cancer Brother    Hyperlipidemia Brother    Colon cancer Neg Hx    Colon polyps Neg Hx     His Social History Is Significant For: Social History   Socioeconomic History   Marital status: Married    Spouse name: Not on file   Number of children: Not on file   Years of education: 14   Highest education level: Some college, no degree  Occupational History   Occupation: Retired    Comment: [employer] in [employer]  Tobacco Use   Smoking status: Never   Smokeless tobacco: Never  Vaping Use   Vaping status: Never Used  Substance and Sexual Activity   Alcohol use: No   Drug use: No   Sexual activity: Yes  Other Topics Concern   Not on file  Social History Narrative   Not on file   Social Drivers of Health   Tobacco Use: Low Risk (02/19/2024)   Patient History    Smoking Tobacco Use: Never    Smokeless Tobacco Use: Never    Passive Exposure: Not on file  Financial Resource Strain: Not on file   Food Insecurity: No Food Insecurity (12/10/2023)   Epic    Worried About Programme Researcher, Broadcasting/film/video in the Last Year: Never true    Ran Out of Food in the Last Year: Never true  Transportation Needs: No Transportation Needs (12/10/2023)   Epic    Lack of Transportation (Medical): No    Lack of Transportation (Non-Medical): No  Physical Activity: Not on file  Stress: Not on file  Social Connections: Socially Integrated (12/10/2023)   Social Connection and Isolation Panel    Frequency of Communication with Friends and Family: Twice a week    Frequency of Social Gatherings with Friends and Family: More than three times a week    Attends Religious Services: More than 4 times per year    Active Member of [employer] or Organizations: Yes    Attends Banker Meetings: More than 4 times per year    Marital Status: Married  Depression (PHQ2-9): Low Risk (10/15/2023)   Depression (PHQ2-9)    PHQ-2 Score: 0  Alcohol Screen: Not on file  Housing: Low Risk (12/10/2023)   Epic    Unable to Pay for Housing in the Last Year: No    Number of Times Moved in the Last Year: 0    Homeless in the Last Year: No  Utilities: Not At Risk (12/10/2023)   Epic    Threatened with loss of utilities: No  Health Literacy: Not on file    His Allergies Are:  Allergies[1]:   His Current Medications Are:  Outpatient Encounter Medications as of 02/19/2024  Medication Sig   amoxicillin  (AMOXIL ) 500 MG tablet Take 4 tablets (2,000 mg total) by mouth as directed. 1 hour prior to dental work including cleanings   apixaban  (ELIQUIS ) 5 MG TABS tablet Take 1 tablet (5 mg total) by mouth 2 (two) times daily.   Ascorbic Acid (VITAMIN C) 1000 MG tablet Take 1,000 mg by mouth daily.   cholecalciferol (VITAMIN D) 1000 units tablet Take 1,000 Units by mouth daily.   diclofenac Sodium (VOLTAREN ARTHRITIS PAIN) 1 % GEL Apply 2 g topically daily as needed (Knee pain).  digoxin  (LANOXIN ) 0.25 MG tablet Take 1/2 (one-half)  tablet by mouth once daily   diltiazem  (CARDIZEM  CD) 240 MG 24 hr capsule Take 1 capsule (240 mg total) by mouth daily.   doxazosin  (CARDURA ) 4 MG tablet Take 4 mg by mouth daily.   finasteride  (PROSCAR ) 5 MG tablet Take 5 mg by mouth daily.   furosemide  (LASIX ) 40 MG tablet Take 1 tablet (40 mg total) by mouth daily.   glyBURIDE (DIABETA) 2.5 MG tablet Take 2.5 mg by mouth 3 (three) times daily.   losartan  (COZAAR ) 50 MG tablet Take 1 tablet (50 mg total) by mouth daily.   MAGNESIUM  GLYCINATE PO Take 500 mg by mouth daily.   metFORMIN (GLUCOPHAGE) 500 MG tablet Take 500 mg by mouth 3 (three) times daily.   metoprolol  tartrate (LOPRESSOR ) 25 MG tablet Take 1 tablet (25 mg total) by mouth 2 (two) times daily.   Multiple Vitamins-Minerals (MULTIVITAMIN WITH MINERALS) tablet Take 1 tablet by mouth daily.   Multiple Vitamins-Minerals (PRESERVISION AREDS 2 PO) Take 1 capsule by mouth 2 (two) times daily.   NON FORMULARY Pt uses a cpap nightly   pravastatin  (PRAVACHOL ) 80 MG tablet Take 1 tablet (80 mg total) by mouth daily.   vitamin B-12 (CYANOCOBALAMIN) 1000 MCG tablet Take 1,000 mcg by mouth daily.   vitamin E 400 UNIT capsule Take 400 Units by mouth daily.   No facility-administered encounter medications on file as of 02/19/2024.  :   Review of Systems:  Out of a complete 14 point review of systems, all are reviewed and negative with the exception of these symptoms as listed below:   Review of Systems  Objective:  Neurological Exam  Physical Exam Physical Examination:   Vitals:   02/19/24 1410  BP: 109/62  Pulse: 78    General Examination: The patient is a very pleasant 74 y.o. male in no acute distress. He appears mildly deconditioned.  Well-groomed.   HEENT: Normocephalic, atraumatic, pupils are equal, round and reactive to light, extraocular tracking is good without limitation to gaze excursion or nystagmus noted. No photophobia, no corrective eyeglasses in place, status post  bilateral cataract surgeries.  Hearing grossly intact with bilateral hearing aids in place.   Face is symmetric with normal facial animation and normal facial sensation to light touch, temperature and vibration sense. Speech is clear without dysarthria. There is no hypophonia. There is no lip, neck/head, jaw or voice tremor. Neck is supple with full range of passive and active motion. There are no carotid bruits on auscultation.  Airway/Oropharynx exam reveals: mild mouth dryness, adequate dental hygiene and mild airway crowding.  Tongue protrudes slightly deviating to the left, palate elevates symmetrically.  Chest: Clear to auscultation without wheezing, rhonchi or crackles noted.  Heart: S1+S2+0, irregularly irregular without murmurs, rubs or gallops noted.   Abdomen: Soft, non-tender and non-distended.  Extremities: There is 1+ pitting edema in the left leg, he also has left knee pain.  He has trace edema on the right side.    Skin: Warm and dry without trophic changes noted.   Musculoskeletal: exam reveals no obvious joint deformities.   Neurologically:  Mental status: The patient is awake, alert and oriented in all 4 spheres. His immediate and remote memory, attention, language skills and fund of knowledge are fairly appropriate.  He is able to provide his history, additional details are provided by his wife. Speech is clear with normal prosody and enunciation. Thought process is linear. Mood is normal and affect is  normal.      02/19/2024    2:14 PM 09/01/2020   10:02 AM  MMSE - Mini Mental State Exam  Orientation to time 4 5  Orientation to Place 5 5  Registration 3 3  Attention/ Calculation 5 4  Recall 2 3  Language- name 2 objects 2 2  Language- repeat 1 1  Language- follow 3 step command 3 1  Language- read & follow direction 1 1  Write a sentence 1 1  Copy design 0 0  Total score 27 26     Cranial nerves II - XII are as described above under HEENT exam.  Motor exam:  Normal bulk, strength and tone is noted. There is no obvious action or resting tremor.  Some pain limitation in the left leg. Reflexes 1+ in the upper extremities and trace in the lower extremities.  Toes are downgoing bilaterally.  Romberg is not tested for safety concerns. Fine motor skills and coordination: Intact finger taps and foot taps, slight difficulty with the left leg and foot in general.  Cerebellar testing: No dysmetria or intention tremor. There is no truncal or gait ataxia.  Sensory exam: intact to light touch in the upper and lower extremities.  Gait, station and balance: He stands with difficulty and walks with a significant limp on the left, no walking aid.   Assessment and Plan:   In summary, LORIN GAWRON is a very pleasant 74 year old male with an underlying medical history of hypertension, hyperlipidemia, chronic A-fib on Eliquis , history of pulmonary nodules, osteoarthritis, diabetes, sleep apnea on PAP therapy, aortic stenosis with status post TAVR in November 2025, and overweight state, who presents for evaluation of his memory concerns of several years' duration.  Memory scores with MMSE testing today are mildly abnormal.  He does not have a strong family history of dementia but his sister has been diagnosed with Alzheimer's dementia per wife.  He has multiple vascular risk factors which we discussed today.  He has been in close follow-up with cardiology and PCP.  We talked about lifestyle modification and further workup.  He had neuropsychological evaluation in early 2023 with no definitive concern for an underlying neurodegenerative disease.  Nevertheless, repeat testing was recommended at the time and I would like to proceed with a referral to neuropsychology.  We will do some blood work today.  He is advised to increase his water  intake and stay consistent with his PAP machine for his sleep apnea.  We will proceed with a brain MRI as well.  We discussed the possibility of  checking for Alzheimer's markers but since he would not be a candidate in my opinion for one of the IV medications for Alzheimer's dementia and given his multiple vascular risk factors and the fact that he is on a blood thinner, we may consider oral medication such as Aricept or Namenda in the near future, we mutually agreed to forego Alzheimer's genetic marker testing or chemical marker testing for Alzheimer's dementia.   We will plan a follow-up in a few months.  We will keep him posted as to his test results by phone call in the interim.   Below is a summary of my recommendations and our discussion points from today's visit, based on chart review, history and examination. They were given these instructions verbally during the visit in detail and also in writing in the MyChart after visit summary (AVS), which they can access electronically. <<  Blood work (which we will do today).  We will do a brain scan, called MRI and call you with the test results. We will have to schedule you for this on a separate date. This test requires authorization from your insurance, and we will take care of the insurance process. We will request a formal cognitive test called neuropsychological evaluation which is done by a licensed neuropsychologist. We will make a referral in that regard.  We may consider medication for memory loss.   Please continue to use your CPAP or AutoPap machine consistently for sleep apnea management.   Increase your water  intake to about 4 bottles of water  per day, 16.9 ounce size each.   We will keep you posted as to your test results by phone call for now.  We will plan a follow-up after testing.   >>   This was an extended visit of over 60 minutes with copious record review involved, memory testing, interpretation of memory scores, personal review of scans where possible, comprehensive exam. Thank you very much for allowing me to participate in the care of this nice patient. If I can be of  any further assistance to you please do not hesitate to call me at [phone].  Sincerely,   True Mar, MD, PhD      [1] No Known Allergies

## 2024-02-19 NOTE — Telephone Encounter (Signed)
 no auth required sent to [facility] [phone]

## 2024-02-19 NOTE — Patient Instructions (Signed)
 It was nice to meet you today.  You have complaints of memory loss: memory loss or changes in cognitive function can have many reasons and does not always mean you have dementia.  There are several conditions and situations that can contribute to subjective or objective memory loss.  These factors include: depression, stress, sleep deprivation or poor sleep from insomnia or sleep apnea, dehydration, fluctuation in blood sugar values, thyroid or electrolyte dysfunction, medication effects from sedating medications or narcotic pain medication for example and certain vitamin deficiencies such as vitamin B12 deficiency, and anemia. Dementia can be caused by stroke, brain atherosclerosis or brain vascular disease due to vascular risk factors (smoking, high blood pressure, high cholesterol, obesity and uncontrolled diabetes), certain degenerative brain disorders (including Parkinson's disease and Multiple sclerosis) and by Alzheimer's disease or other, more rare and sometimes hereditary causes.   Here is what I would recommend:   Blood work (which we will do today).  We will do a brain scan, called MRI and call you with the test results. We will have to schedule you for this on a separate date. This test requires authorization from your insurance, and we will take care of the insurance process. We will request a formal cognitive test called neuropsychological evaluation which is done by a licensed neuropsychologist. We will make a referral in that regard.  We may consider medication for memory loss.   Please continue to use your CPAP or AutoPap machine consistently for sleep apnea management.   Increase your water  intake to about 4 bottles of water  per day, 16.9 ounce size each.   We will keep you posted as to your test results by phone call for now.  We will plan a follow-up after testing.

## 2024-02-20 LAB — B12 AND FOLATE PANEL
Folate: 14.6 ng/mL
Vitamin B-12: 615 pg/mL (ref 232–1245)

## 2024-02-20 LAB — TSH: TSH: 3.34 u[IU]/mL (ref 0.450–4.500)

## 2024-03-09 ENCOUNTER — Ambulatory Visit: Admitting: Gastroenterology

## 2024-08-20 ENCOUNTER — Ambulatory Visit: Admitting: Neurology

## 2024-11-26 ENCOUNTER — Ambulatory Visit: Admitting: Physician Assistant

## 2024-11-26 ENCOUNTER — Ambulatory Visit (HOSPITAL_COMMUNITY)
